# Patient Record
Sex: Male | Born: 1960 | Race: White | Hispanic: No | Smoking: Current every day smoker
Health system: Southern US, Community
[De-identification: ages and names within clinical notes are randomized; demographics above are authoritative.]

## PROBLEM LIST (undated history)

## (undated) DIAGNOSIS — R569 Unspecified convulsions: Secondary | ICD-10-CM

## (undated) DIAGNOSIS — F419 Anxiety disorder, unspecified: Secondary | ICD-10-CM

## (undated) DIAGNOSIS — E119 Type 2 diabetes mellitus without complications: Secondary | ICD-10-CM

## (undated) DIAGNOSIS — I639 Cerebral infarction, unspecified: Secondary | ICD-10-CM

## (undated) DIAGNOSIS — F1011 Alcohol abuse, in remission: Secondary | ICD-10-CM

## (undated) DIAGNOSIS — J449 Chronic obstructive pulmonary disease, unspecified: Secondary | ICD-10-CM

## (undated) DIAGNOSIS — I509 Heart failure, unspecified: Secondary | ICD-10-CM

## (undated) DIAGNOSIS — B1920 Unspecified viral hepatitis C without hepatic coma: Secondary | ICD-10-CM

## (undated) DIAGNOSIS — I219 Acute myocardial infarction, unspecified: Secondary | ICD-10-CM

## (undated) DIAGNOSIS — K7460 Unspecified cirrhosis of liver: Secondary | ICD-10-CM

## (undated) DIAGNOSIS — M1990 Unspecified osteoarthritis, unspecified site: Secondary | ICD-10-CM

## (undated) HISTORY — PX: FRACTURE SURGERY: SHX138

## (undated) HISTORY — PX: TONSILLECTOMY: SUR1361

## (undated) HISTORY — DX: Alcohol abuse, in remission: F10.11

## (undated) HISTORY — PX: EYE SURGERY: SHX253

---

## 2010-08-10 HISTORY — PX: ESOPHAGOGASTRODUODENOSCOPY: SHX1529

## 2010-08-10 HISTORY — PX: COLONOSCOPY: SHX174

## 2011-07-15 DIAGNOSIS — J449 Chronic obstructive pulmonary disease, unspecified: Secondary | ICD-10-CM | POA: Diagnosis present

## 2011-07-15 DIAGNOSIS — G8921 Chronic pain due to trauma: Secondary | ICD-10-CM | POA: Insufficient documentation

## 2011-07-15 DIAGNOSIS — F1010 Alcohol abuse, uncomplicated: Secondary | ICD-10-CM | POA: Insufficient documentation

## 2013-12-20 ENCOUNTER — Emergency Department (HOSPITAL_COMMUNITY)
Admission: EM | Admit: 2013-12-20 | Discharge: 2013-12-20 | Disposition: A | Discharge status: Home / Self Care | Payer: Medicaid Other | Attending: Emergency Medicine | Admitting: Emergency Medicine

## 2013-12-20 ENCOUNTER — Encounter (HOSPITAL_COMMUNITY): Payer: Self-pay | Admitting: Emergency Medicine

## 2013-12-20 DIAGNOSIS — F17200 Nicotine dependence, unspecified, uncomplicated: Secondary | ICD-10-CM | POA: Diagnosis not present

## 2013-12-20 DIAGNOSIS — K068 Other specified disorders of gingiva and edentulous alveolar ridge: Secondary | ICD-10-CM

## 2013-12-20 DIAGNOSIS — K089 Disorder of teeth and supporting structures, unspecified: Secondary | ICD-10-CM | POA: Diagnosis present

## 2013-12-20 DIAGNOSIS — K1370 Unspecified lesions of oral mucosa: Secondary | ICD-10-CM | POA: Insufficient documentation

## 2013-12-20 HISTORY — DX: Chronic obstructive pulmonary disease, unspecified: J44.9

## 2013-12-20 HISTORY — DX: Unspecified convulsions: R56.9

## 2013-12-20 HISTORY — DX: Unspecified viral hepatitis C without hepatic coma: B19.20

## 2013-12-20 HISTORY — DX: Unspecified cirrhosis of liver: K74.60

## 2013-12-20 MED ORDER — IBUPROFEN 800 MG PO TABS
800.0000 mg | ORAL_TABLET | Freq: Once | ORAL | Status: AC
  Administered 2013-12-20: 800 mg via ORAL
  Filled 2013-12-20: qty 1

## 2013-12-20 MED ORDER — IBUPROFEN 800 MG PO TABS: 800.0000 mg | ORAL_TABLET | Freq: Three times a day (TID) | ORAL | Status: DC | PRN

## 2013-12-20 MED ORDER — AMOXICILLIN 500 MG PO CAPS: 500.0000 mg | ORAL_CAPSULE | Freq: Three times a day (TID) | ORAL | Status: DC

## 2013-12-20 NOTE — ED Notes (Signed)
Multiple teeth pulled Friday. Pain since.

## 2013-12-20 NOTE — ED Provider Notes (Signed)
CSN: [id number]     Arrival date & time 12/20/13  8315 History   First MD Initiated Contact with Patient 12/20/13 1003     No chief complaint on file.    (Consider location/radiation/quality/duration/timing/severity/associated sxs/prior Treatment) HPI Comments: Patient is 53 year old male who presents to the ED with complaints of gum pain - he states that 3 days ago [facility] and had the remaining teeth in his maxilla pulled - he states that since then he has had increased swelling and pain - he thinks he may have dry socket.  He denies fever or chills but reports that OTC medications are not helping - no inability to open his mouth but does report po intake is poor due to the pain.  The history is provided by the patient. No language interpreter was used.    No past medical history on file. No past surgical history on file. No family history on file. History  Substance Use Topics  . Smoking status: Not on file  . Smokeless tobacco: Not on file  . Alcohol Use: Not on file    Review of Systems  All other systems reviewed and are negative.     Allergies  Review of patient's allergies indicates not on file.  Home Medications   Prior to Admission medications   Not on File   There were no vitals taken for this visit. Physical Exam  Nursing note and vitals reviewed. Constitutional: He is oriented to person, place, and time. He appears well-developed and well-nourished. No distress.  HENT:  Head: Normocephalic and atraumatic.  Right Ear: External ear normal.  Left Ear: External ear normal.  Nose: Nose normal.  Mouth/Throat: No oropharyngeal exudate.  endentulous to upper jaw with healing sockets of teeth #3, #4, #6, #7, #8, #9, #11 and #12.  Marked gingivitis without frank abscess formation, no trismus, no facial swelling.  Eyes: Conjunctivae are normal. Pupils are equal, round, and reactive to light. No scleral icterus.  Pulmonary/Chest: Effort  normal.  Musculoskeletal: Normal range of motion. He exhibits no edema and no tenderness.  Neurological: He is alert and oriented to person, place, and time. He exhibits normal muscle tone. Coordination normal.  Skin: Skin is warm and dry. No rash noted. No erythema. No pallor.  Psychiatric: He has a normal mood and affect. His behavior is normal. Judgment and thought content normal.    ED Course  Procedures (including critical care time) Labs Review Labs Reviewed - No data to display  Imaging Review No results found.   EKG Interpretation None      MDM   Gum pain   Patient here with gum pain after having teeth pulled, I do not suspect soft tissue facial abscess, ludwig's angina.  Will start on antibiotics and ibuprofen 800mg     Joaquim Lai C. Joelyn Oms, PA-C 12/20/13 1017

## 2013-12-20 NOTE — Discharge Instructions (Signed)
Gingivitis Gingivitis is a form of gum (periodontal) disease that causes redness, soreness, and swelling (inflammation) of your gums. CAUSES The most common cause of gingivitis is poor oral hygiene. A sticky substance made of bacteria, mucus, and food particles (plaque), is deposited on the exposed part of teeth. As plaque builds up, it reacts with the saliva in your mouth to form something called  tartar. Tartar is a hard deposit that becomes trapped around the base of the tooth. Plaque and tartar irritate the gums, leading to the formation of gingivitis. Other factors that increase your risk for gingivitis include:   Tobacco use.  Diabetes.  Older age.  Certain medications.  Certain viral or fungal infections.  Dry mouth.  Hormonal changes such as during pregnancy.  Poor nutrition.  Substance abuse.  Poor fitting dental restorations or appliances. SYMPTOMS You may notice inflammation of the soft tissue (gingiva) around the teeth. When these tissues become inflamed, they bleed easily, especially during flossing or brushing. The gums may also be:   Tender to the touch.  Bright red, purple red, or have a shiny appearance.  Swollen.  Wearing away from the teeth (receding), which exposes more of the tooth. Bad breath is often present. Continued infection around teeth can eventually cause cavities and loosen teeth. This may lead to eventual tooth loss. DIAGNOSIS A medical and dental history will be taken. Your mouth, teeth, and gums will be examined. Your dentist will look for soft, swollen purple-red, irritated gums. There may be deposits of plaque and tartar at the base of the teeth. Your gums will be looked at for the degree of redness, puffiness, and bleeding tendencies. Your dentist will see if any of the teeth are loose. X-rays may be taken to see if the inflammation has spread to the supporting structures of the teeth. TREATMENT The goal is to reduce and reverse the  inflammation. Proper treatment can usually reverse the symptoms of gingivitis and prevent further progression of the disease. Have your teeth cleaned. During the cleaning, all plaque and tartar will be removed. Instruction for proper home care will be given. You will need regular professional cleanings and check-ups in the future. HOME CARE INSTRUCTIONS  Brush your teeth twice a day and floss at least once per day. When flossing, it is best to floss first then brush.  Limit sugar between meals and maintain a well-balanced diet.  Even the best dental hygiene will not prevent plaque from developing. It is necessary for you to see your dentist on a regular basis for cleaning and regular checkups.  Your dentist can recommend proper oral hygiene and mouth care and suggest special toothpastes or mouth rinses.  Stop smoking. SEEK DENTAL OR MEDICAL CARE IF:  You have painful, reddened tissue around your teeth, or you have puffy swollen gums.  You have difficulty chewing.  You notice any loose or infected teeth.  You have swollen glands.  Your gums bleed easily when you brush your teeth or are very tender to the touch. Document Released: 01/20/2001 Document Revised: 10/19/2011 Document Reviewed: 10/31/2010 [facility] Patient Information 2014 [place].  Gum Disease Gum disease is an infection of the tissues that surround and support the teeth (periodontium). This includes the gums, connective tissue fibers (ligaments), and the thickened ridges of the tooth bone (sockets). The disease is caused by germs (bacteria) that grow in soft deposits (plaque) on the teeth. This results in redness, soreness, and swelling (inflammation). This inflammation causes the gums to bleed. If left  untreated, it can lead to damage of the tissues and supportive bone. Although bacteria are known as the major cause of gum disease, other risk factors include tobacco use, diabetes, certain medications, hormones,  pregnancy, and genetic factors. SYMPTOMS   Gums that bleed easily.  Red or swollen gums.  Bad breath that does not go away.  Gums that have pulled away from the teeth.  Loose or separating permanent teeth.  Painful chewing.  Changes in the way your teeth fit together. DIAGNOSIS  A thorough exam will be performed by a dentist to determine the presence and stage of gum disease. The stage is how far the gum disease has developed. TREATMENT  Treatment is based on the stages of gum disease. The stages include:  Mild. If it is caught early, conditions can improve by brushing and flossing properly.  Moderate. You may need special cleaning (scaling and root planing). This method removes plaque and hardened plaque (tartar) above and below the gum line. Medication may also be used to treat moderate gum disease.  Severe. This stage requires surgery of the gums and supporting bone. PREVENTION  You can prevent gum disease by:  Practicing good oral hygiene, including brushing and flossing properly.  Avoiding use of tobacco products.  Scheduling regular dental check-ups and cleanings.  Eating a well-balanced diet. SEEK IMMEDIATE DENTAL OR MEDICAL CARE IF:  You have fever over 102 F (38.9 C).  You have swelling of your face, neck, or jaw.  You are unable to open your mouth.  You have severe pain not controlled by pain medicine. Document Released: 01/14/2010 Document Revised: 04/20/2012 Document Reviewed: 01/14/2010 [facility] Patient Information 2014 [place].

## 2013-12-21 NOTE — ED Provider Notes (Signed)
Medical screening examination/treatment/procedure(s) were performed by non-physician practitioner and as supervising physician I was immediately available for consultation/collaboration.   EKG Interpretation None        Alfonzo Feller, DO 12/21/13 1023

## 2014-04-10 ENCOUNTER — Emergency Department (HOSPITAL_COMMUNITY)
Admission: EM | Admit: 2014-04-10 | Discharge: 2014-04-10 | Disposition: A | Discharge status: Home / Self Care | Payer: Medicaid Other | Attending: Emergency Medicine | Admitting: Emergency Medicine

## 2014-04-10 ENCOUNTER — Encounter (HOSPITAL_COMMUNITY): Payer: Self-pay | Admitting: Emergency Medicine

## 2014-04-10 DIAGNOSIS — Z8719 Personal history of other diseases of the digestive system: Secondary | ICD-10-CM | POA: Diagnosis not present

## 2014-04-10 DIAGNOSIS — Z791 Long term (current) use of non-steroidal anti-inflammatories (NSAID): Secondary | ICD-10-CM | POA: Insufficient documentation

## 2014-04-10 DIAGNOSIS — H5710 Ocular pain, unspecified eye: Secondary | ICD-10-CM | POA: Insufficient documentation

## 2014-04-10 DIAGNOSIS — Z79899 Other long term (current) drug therapy: Secondary | ICD-10-CM | POA: Insufficient documentation

## 2014-04-10 DIAGNOSIS — J449 Chronic obstructive pulmonary disease, unspecified: Secondary | ICD-10-CM | POA: Insufficient documentation

## 2014-04-10 DIAGNOSIS — F17200 Nicotine dependence, unspecified, uncomplicated: Secondary | ICD-10-CM | POA: Insufficient documentation

## 2014-04-10 DIAGNOSIS — Z7982 Long term (current) use of aspirin: Secondary | ICD-10-CM | POA: Insufficient documentation

## 2014-04-10 DIAGNOSIS — G40909 Epilepsy, unspecified, not intractable, without status epilepticus: Secondary | ICD-10-CM | POA: Diagnosis not present

## 2014-04-10 DIAGNOSIS — Z9889 Other specified postprocedural states: Secondary | ICD-10-CM | POA: Insufficient documentation

## 2014-04-10 DIAGNOSIS — Z8669 Personal history of other diseases of the nervous system and sense organs: Secondary | ICD-10-CM

## 2014-04-10 DIAGNOSIS — H02843 Edema of right eye, unspecified eyelid: Secondary | ICD-10-CM

## 2014-04-10 DIAGNOSIS — Z8619 Personal history of other infectious and parasitic diseases: Secondary | ICD-10-CM | POA: Diagnosis not present

## 2014-04-10 DIAGNOSIS — J4489 Other specified chronic obstructive pulmonary disease: Secondary | ICD-10-CM | POA: Insufficient documentation

## 2014-04-10 DIAGNOSIS — H5789 Other specified disorders of eye and adnexa: Secondary | ICD-10-CM | POA: Insufficient documentation

## 2014-04-10 MED ORDER — CIPROFLOXACIN HCL 0.3 % OP SOLN: 2.0000 [drp] | OPHTHALMIC | Status: DC

## 2014-04-10 MED ORDER — KETOROLAC TROMETHAMINE 0.5 % OP SOLN: 1.0000 [drp] | Freq: Four times a day (QID) | OPHTHALMIC | Status: DC | PRN
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
## 2014-04-10 NOTE — Discharge Instructions (Signed)
Warm compresses on your eye. Use the eye drops for pain and infection. You can use triple antibiotic ointment on the skin that is peeling. You can be rechecked by Dr Iona Hansen, the ophthalmologist on call, call his office to get an appointment or see your own eye doctor. Return if you get fever, worsening swelling or pain.

## 2014-04-10 NOTE — ED Notes (Signed)
Vision 20/20 with glasses.  Pt has artificial right eye.

## 2014-05-07 ENCOUNTER — Encounter: Payer: Self-pay | Admitting: Gastroenterology

## 2014-05-15 ENCOUNTER — Ambulatory Visit: Payer: Medicaid Other | Admitting: Gastroenterology

## 2014-05-24 ENCOUNTER — Ambulatory Visit: Payer: Medicaid Other | Admitting: Gastroenterology

## 2014-06-11 ENCOUNTER — Ambulatory Visit (INDEPENDENT_AMBULATORY_CARE_PROVIDER_SITE_OTHER): Payer: Medicaid Other | Admitting: Gastroenterology

## 2014-06-11 ENCOUNTER — Encounter: Payer: Self-pay | Admitting: Gastroenterology

## 2014-06-11 VITALS — BP 108/75 | HR 84 | Temp 97.9°F | Ht 66.0 in | Wt 157.0 lb

## 2014-06-11 DIAGNOSIS — K7460 Unspecified cirrhosis of liver: Secondary | ICD-10-CM

## 2014-06-11 DIAGNOSIS — K5900 Constipation, unspecified: Secondary | ICD-10-CM | POA: Insufficient documentation

## 2014-06-11 MED ORDER — LINACLOTIDE 145 MCG PO CAPS
145.0000 ug | ORAL_CAPSULE | Freq: Every day | ORAL | Status: DC
Start: 2014-06-11 — End: 2014-07-31
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
## 2014-06-11 NOTE — Assessment & Plan Note (Signed)
Chronic. Start Linzess 145 mcg once daily. Samples provided.

## 2014-06-11 NOTE — Assessment & Plan Note (Addendum)
53 year old male with reported cirrhosis in the setting of chronic ETOH abuse and +HCV antibody. Unknown HCV PCR. Treatment naive. Fortunately, he has abstained from ETOH use for at least 7 months. Well-compensated. Last EGD in remote past. Ultimately needs HCV RNA with reflex genotype, INR, and elastography prior to submission for Harvoni to treat Hep C. Will also need EGD for variceal screening; I have requested outside records from [facility]. Will await these records prior to planning procedures. Will likely need colonoscopy as well due to low-volume hematochezia, which is likely benign in nature. However, if at least 3 years have passed since last lower GI evaluation, would have a low threshold for colonoscopy at time of EGD. Needs Hep A and B vaccinations as well; prescription provided.

## 2014-06-11 NOTE — Progress Notes (Signed)
cc'ed to pcp °

## 2014-06-11 NOTE — Patient Instructions (Signed)
Start taking Linzess 1 capsule each morning, 30 minutes before breakfast. This is for constipation. I have provided samples and sent the prescription to your pharmacy.   Please have blood work and ultrasound completed. We will then submit for Hepatitis C treatment.   Please complete Hepatitis A and B vaccinations with Dr. Legrand Rams.  Finally, I am reviewing records. We definitely need to pursue an upper endoscopy, but you may need a colonoscopy as well.

## 2014-06-12 LAB — PROTIME-INR
INR: 1.15 (ref <1.50)
PROTHROMBIN TIME: 14.8 s (ref 11.6–15.2)

## 2014-06-14 ENCOUNTER — Other Ambulatory Visit (HOSPITAL_COMMUNITY): Payer: Medicaid Other

## 2014-06-15 LAB — HCV RNA QUANT RFLX ULTRA OR GENOTYP
HCV QUANT LOG: 6.84 {Log} — AB (ref <1.18)
HCV QUANT: 6952175 [IU]/mL — AB (ref <15)

## 2014-06-19 LAB — HEPATITIS C GENOTYPE

## 2014-06-21 ENCOUNTER — Ambulatory Visit (HOSPITAL_COMMUNITY): Admission: RE | Admit: 2014-06-21 | Payer: Medicaid Other | Source: Ambulatory Visit

## 2014-07-02 ENCOUNTER — Telehealth: Payer: Self-pay | Admitting: Gastroenterology

## 2014-07-02 NOTE — Progress Notes (Signed)
Quick Note:  +RNA. Hep C genotype 1a.  Needs elastography, then we can submit for Harvoni X 12 weeks (treatment naive). Looks like he missed the elastography earlier this month. ______

## 2014-07-02 NOTE — Telephone Encounter (Signed)
Pt called saying that Dr Legrand Rams wanted him to go ahead and get set up for his colonoscopy and try to have it the same day as his other testing he has scheduled on NOV 30th. I told patient that with all the problems he is having he would need an OV prior to scheduling but he said that he was just seen on NOV 2. Please advise if he can have both these done on same day and call him back [phone]

## 2014-07-02 NOTE — Telephone Encounter (Signed)
Please advise 

## 2014-07-03 NOTE — Telephone Encounter (Signed)
He can't have it done on 11/30 because that is the elastography date. I have been unable to get the TCS/EGD reports from the past. He was not clear when this was done. Let's try to get these as soon as possible. He said it was from [facility]? I couldn't find through [facility].   If we can get it scheduled within 30 days and he is actually due for a colonoscopy, then I would say absolutely.

## 2014-07-03 NOTE — Telephone Encounter (Signed)
I have sent a second request ASAP to [facility].

## 2014-07-03 NOTE — Telephone Encounter (Signed)
Pt is set up for [place] on 07/09/14 @ 11:00

## 2014-07-03 NOTE — Telephone Encounter (Signed)
Received records.   Please see my addended note to office visit.

## 2014-07-04 ENCOUNTER — Encounter: Payer: Self-pay | Admitting: Gastroenterology

## 2014-07-04 ENCOUNTER — Other Ambulatory Visit: Payer: Self-pay

## 2014-07-04 MED ORDER — PEG-KCL-NACL-NASULF-NA ASC-C 100 G PO SOLR: 1.0000 | ORAL | Status: DC

## 2014-07-04 NOTE — Progress Notes (Signed)
Quick Note:  Let's set him up for TCS/EGD with Propofol with Dr. Oneida Alar. See office visit addendum. ______

## 2014-07-04 NOTE — Progress Notes (Signed)
Received outside operative reports. EGD/TCS in 2012 at [place]. Internal hemorrhoids, no polyps. EGD normal.   Due to history of cirrhosis and now low-volume hematochezia, proceed with TCS/EGD with Dr. Oneida Alar with Propofol.

## 2014-07-04 NOTE — Progress Notes (Signed)
Pt is set up for 07/31/14 @ 945

## 2014-07-09 ENCOUNTER — Ambulatory Visit (HOSPITAL_COMMUNITY)
Admission: RE | Admit: 2014-07-09 | Discharge: 2014-07-09 | Disposition: A | Discharge status: Home / Self Care | Payer: Medicaid Other | Source: Ambulatory Visit | Attending: Gastroenterology | Admitting: Gastroenterology

## 2014-07-09 DIAGNOSIS — B1920 Unspecified viral hepatitis C without hepatic coma: Secondary | ICD-10-CM | POA: Insufficient documentation

## 2014-07-09 DIAGNOSIS — K7460 Unspecified cirrhosis of liver: Secondary | ICD-10-CM

## 2014-07-24 NOTE — Progress Notes (Signed)
Quick Note:  Although he reports a history of cirrhosis, elastography reveals score of F0/F1, minimal fibrosis. He is scheduled for a colonoscopy/EGD. May not need EGD, but this can be determined at time of procedure. May be difficult to have Harvoni approved due to fibrosis score, but we will attempt this. ______

## 2014-07-25 NOTE — Patient Instructions (Signed)
Kepler Mccabe  07/25/2014   Your procedure is scheduled on:  07/31/2014  Report to [facility] at  800  AM.  Call this number if you have problems the morning of surgery: [phone]   Remember:   Do not eat food or drink liquids after midnight.   Take these medicines the morning of surgery with A SIP OF WATER:  Tegretol, neurontin, seroquel   Do not wear jewelry, make-up or nail polish.  Do not wear lotions, powders, or perfumes.   Do not shave 48 hours prior to surgery. Men may shave face and neck.  Do not bring valuables to the hospital.  [facility] is not responsible for any belongings or valuables.               Contacts, dentures or bridgework may not be worn into surgery.  Leave suitcase in the car. After surgery it may be brought to your room.  For patients admitted to the hospital, discharge time is determined by your treatment team.               Patients discharged the day of surgery will not be allowed to drive home.  Name and phone number of your driver: home Special Instructions: N/A   Please read over the following fact sheets that you were given: Pain Booklet, Coughing and Deep Breathing, Surgical Site Infection Prevention, Anesthesia Post-op Instructions and Care and Recovery After Surgery Colonoscopy A colonoscopy is an exam to look at the entire large intestine (colon). This exam can help find problems such as tumors, polyps, inflammation, and areas of bleeding. The exam takes about 1 hour.  LET [facility] CARE PROVIDER KNOW ABOUT:   Any allergies you have.  All medicines you are taking, including vitamins, herbs, eye drops, creams, and over-the-counter medicines.  Previous problems you or members of your family have had with the use of anesthetics.  Any blood disorders you have.  Previous surgeries you have had.  Medical conditions you have. RISKS AND COMPLICATIONS  Generally, this is a safe procedure. However, as with any procedure,  complications can occur. Possible complications include:  Bleeding.  Tearing or rupture of the colon wall.  Reaction to medicines given during the exam.  Infection (rare). BEFORE THE PROCEDURE   Ask your health care provider about changing or stopping your regular medicines.  You may be prescribed an oral bowel prep. This involves drinking a large amount of medicated liquid, starting the day before your procedure. The liquid will cause you to have multiple loose stools until your stool is almost clear or light green. This cleans out your colon in preparation for the procedure.  Do not eat or drink anything else once you have started the bowel prep, unless your health care provider tells you it is safe to do so.  Arrange for someone to drive you home after the procedure. PROCEDURE   You will be given medicine to help you relax (sedative).  You will lie on your side with your knees bent.  A long, flexible tube with a light and camera on the end (colonoscope) will be inserted through the rectum and into the colon. The camera sends video back to a computer screen as it moves through the colon. The colonoscope also releases carbon dioxide gas to inflate the colon. This helps your health care provider see the area better.  During the exam, your health care provider may take a small tissue sample (biopsy) to be  examined under a microscope if any abnormalities are found.  The exam is finished when the entire colon has been viewed. AFTER THE PROCEDURE   Do not drive for 24 hours after the exam.  You may have a small amount of blood in your stool.  You may pass moderate amounts of gas and have mild abdominal cramping or bloating. This is caused by the gas used to inflate your colon during the exam.  Ask when your test results will be ready and how you will get your results. Make sure you get your test results. Document Released: 07/24/2000 Document Revised: 05/17/2013 Document Reviewed:  04/03/2013 [facility] Patient Information 2015 [place], [place]. This information is not intended to replace advice given to you by your health care provider. Make sure you discuss any questions you have with your health care provider. PATIENT INSTRUCTIONS POST-ANESTHESIA  IMMEDIATELY FOLLOWING SURGERY:  Do not drive or operate machinery for the first twenty four hours after surgery.  Do not make any important decisions for twenty four hours after surgery or while taking narcotic pain medications or sedatives.  If you develop intractable nausea and vomiting or a severe headache please notify your doctor immediately.  FOLLOW-UP:  Please make an appointment with your surgeon as instructed. You do not need to follow up with anesthesia unless specifically instructed to do so.  WOUND CARE INSTRUCTIONS (if applicable):  Keep a dry clean dressing on the anesthesia/puncture wound site if there is drainage.  Once the wound has quit draining you may leave it open to air.  Generally you should leave the bandage intact for twenty four hours unless there is drainage.  If the epidural site drains for more than 36-48 hours please call the anesthesia department.  QUESTIONS?:  Please feel free to call your physician or the hospital operator if you have any questions, and they will be happy to assist you.

## 2014-07-26 ENCOUNTER — Encounter (HOSPITAL_COMMUNITY)
Admission: RE | Admit: 2014-07-26 | Discharge: 2014-07-26 | Disposition: A | Discharge status: Home / Self Care | Payer: Medicaid Other | Source: Ambulatory Visit | Attending: Gastroenterology | Admitting: Gastroenterology

## 2014-07-26 ENCOUNTER — Encounter (HOSPITAL_COMMUNITY): Payer: Self-pay

## 2014-07-26 ENCOUNTER — Other Ambulatory Visit: Payer: Self-pay

## 2014-07-26 DIAGNOSIS — Z01818 Encounter for other preprocedural examination: Secondary | ICD-10-CM | POA: Insufficient documentation

## 2014-07-26 HISTORY — DX: Type 2 diabetes mellitus without complications: E11.9

## 2014-07-26 HISTORY — DX: Acute myocardial infarction, unspecified: I21.9

## 2014-07-26 HISTORY — DX: Anxiety disorder, unspecified: F41.9

## 2014-07-26 HISTORY — DX: Unspecified osteoarthritis, unspecified site: M19.90

## 2014-07-26 HISTORY — DX: Cerebral infarction, unspecified: I63.9

## 2014-07-26 LAB — HEMOGLOBIN AND HEMATOCRIT, BLOOD
HCT: 42.7 % (ref 39.0–52.0)
HEMOGLOBIN: 14.2 g/dL (ref 13.0–17.0)

## 2014-07-26 LAB — BASIC METABOLIC PANEL
Anion gap: 15 (ref 5–15)
BUN: 7 mg/dL (ref 6–23)
CO2: 23 mEq/L (ref 19–32)
CREATININE: 0.81 mg/dL (ref 0.50–1.35)
Calcium: 9 mg/dL (ref 8.4–10.5)
Chloride: 103 mEq/L (ref 96–112)
GLUCOSE: 150 mg/dL — AB (ref 70–99)
Potassium: 4 mEq/L (ref 3.7–5.3)
Sodium: 141 mEq/L (ref 137–147)

## 2014-07-26 NOTE — Pre-Procedure Instructions (Signed)
Patient given information to sign up for my chart at home. 

## 2014-07-30 NOTE — Progress Notes (Signed)
Quick Note:  Pt is aware. ______ 

## 2014-07-31 ENCOUNTER — Ambulatory Visit (HOSPITAL_COMMUNITY): Payer: Medicaid Other | Admitting: Anesthesiology

## 2014-07-31 ENCOUNTER — Telehealth: Payer: Self-pay

## 2014-07-31 ENCOUNTER — Encounter (HOSPITAL_COMMUNITY): Payer: Self-pay

## 2014-07-31 ENCOUNTER — Encounter (HOSPITAL_COMMUNITY)
Admission: RE | Disposition: A | Discharge status: Home / Self Care | Payer: Self-pay | Source: Ambulatory Visit | Attending: Gastroenterology

## 2014-07-31 ENCOUNTER — Ambulatory Visit (HOSPITAL_COMMUNITY)
Admission: RE | Admit: 2014-07-31 | Discharge: 2014-07-31 | Disposition: A | Discharge status: Home / Self Care | Payer: Medicaid Other | Source: Ambulatory Visit | Attending: Gastroenterology | Admitting: Gastroenterology

## 2014-07-31 DIAGNOSIS — B9681 Helicobacter pylori [H. pylori] as the cause of diseases classified elsewhere: Secondary | ICD-10-CM | POA: Insufficient documentation

## 2014-07-31 DIAGNOSIS — K7460 Unspecified cirrhosis of liver: Secondary | ICD-10-CM | POA: Insufficient documentation

## 2014-07-31 DIAGNOSIS — E119 Type 2 diabetes mellitus without complications: Secondary | ICD-10-CM | POA: Diagnosis not present

## 2014-07-31 DIAGNOSIS — I252 Old myocardial infarction: Secondary | ICD-10-CM | POA: Insufficient documentation

## 2014-07-31 DIAGNOSIS — D120 Benign neoplasm of cecum: Secondary | ICD-10-CM | POA: Insufficient documentation

## 2014-07-31 DIAGNOSIS — K222 Esophageal obstruction: Secondary | ICD-10-CM | POA: Diagnosis not present

## 2014-07-31 DIAGNOSIS — K625 Hemorrhage of anus and rectum: Secondary | ICD-10-CM | POA: Diagnosis present

## 2014-07-31 DIAGNOSIS — Z79899 Other long term (current) drug therapy: Secondary | ICD-10-CM | POA: Diagnosis not present

## 2014-07-31 DIAGNOSIS — Z791 Long term (current) use of non-steroidal anti-inflammatories (NSAID): Secondary | ICD-10-CM | POA: Insufficient documentation

## 2014-07-31 DIAGNOSIS — K2960 Other gastritis without bleeding: Secondary | ICD-10-CM | POA: Diagnosis not present

## 2014-07-31 DIAGNOSIS — F419 Anxiety disorder, unspecified: Secondary | ICD-10-CM | POA: Insufficient documentation

## 2014-07-31 DIAGNOSIS — Z8673 Personal history of transient ischemic attack (TIA), and cerebral infarction without residual deficits: Secondary | ICD-10-CM | POA: Diagnosis not present

## 2014-07-31 DIAGNOSIS — K209 Esophagitis, unspecified: Secondary | ICD-10-CM | POA: Diagnosis not present

## 2014-07-31 DIAGNOSIS — J449 Chronic obstructive pulmonary disease, unspecified: Secondary | ICD-10-CM | POA: Diagnosis not present

## 2014-07-31 DIAGNOSIS — F17210 Nicotine dependence, cigarettes, uncomplicated: Secondary | ICD-10-CM | POA: Insufficient documentation

## 2014-07-31 DIAGNOSIS — K648 Other hemorrhoids: Secondary | ICD-10-CM | POA: Insufficient documentation

## 2014-07-31 DIAGNOSIS — K635 Polyp of colon: Secondary | ICD-10-CM

## 2014-07-31 DIAGNOSIS — D128 Benign neoplasm of rectum: Secondary | ICD-10-CM

## 2014-07-31 DIAGNOSIS — D125 Benign neoplasm of sigmoid colon: Secondary | ICD-10-CM | POA: Diagnosis not present

## 2014-07-31 DIAGNOSIS — K649 Unspecified hemorrhoids: Secondary | ICD-10-CM

## 2014-07-31 HISTORY — PX: HEMORRHOID BANDING: SHX5850

## 2014-07-31 HISTORY — PX: ESOPHAGOGASTRODUODENOSCOPY (EGD) WITH PROPOFOL: SHX5813

## 2014-07-31 HISTORY — PX: POLYPECTOMY: SHX5525

## 2014-07-31 HISTORY — PX: COLONOSCOPY WITH PROPOFOL: SHX5780

## 2014-07-31 LAB — GLUCOSE, CAPILLARY: Glucose-Capillary: 86 mg/dL (ref 70–99)

## 2014-07-31 SURGERY — COLONOSCOPY WITH PROPOFOL
Anesthesia: Monitor Anesthesia Care | Site: Rectum

## 2014-07-31 MED ORDER — FENTANYL CITRATE 0.05 MG/ML IJ SOLN
25.0000 ug | INTRAMUSCULAR | Status: AC
  Administered 2014-07-31 (×2): 25 ug via INTRAVENOUS

## 2014-07-31 MED ORDER — ONDANSETRON HCL 4 MG/2ML IJ SOLN
4.0000 mg | Freq: Once | INTRAMUSCULAR | Status: AC
  Administered 2014-07-31: 4 mg via INTRAVENOUS

## 2014-07-31 MED ORDER — MIDAZOLAM HCL 2 MG/2ML IJ SOLN
INTRAMUSCULAR | Status: AC
  Filled 2014-07-31: qty 2

## 2014-07-31 MED ORDER — LIDOCAINE HCL (CARDIAC) 20 MG/ML IV SOLN
INTRAVENOUS | Status: DC | PRN
  Administered 2014-07-31: 50 mg via INTRAVENOUS

## 2014-07-31 MED ORDER — LIDOCAINE VISCOUS 2 % MT SOLN
3.0000 mL | Freq: Once | OROMUCOSAL | Status: AC
Start: 2014-07-31 — End: 2014-07-31
  Administered 2014-07-31: 3 mL via OROMUCOSAL

## 2014-07-31 MED ORDER — LIDOCAINE VISCOUS 2 % MT SOLN
OROMUCOSAL | Status: AC
  Filled 2014-07-31: qty 15

## 2014-07-31 MED ORDER — FENTANYL CITRATE 0.05 MG/ML IJ SOLN
INTRAMUSCULAR | Status: AC
Start: 2014-07-31 — End: 2014-07-31
  Filled 2014-07-31: qty 2

## 2014-07-31 MED ORDER — LIDOCAINE HCL (PF) 1 % IJ SOLN
INTRAMUSCULAR | Status: AC
  Filled 2014-07-31: qty 5

## 2014-07-31 MED ORDER — ONDANSETRON HCL 4 MG/2ML IJ SOLN
INTRAMUSCULAR | Status: AC
  Filled 2014-07-31: qty 2

## 2014-07-31 MED ORDER — PROPOFOL 10 MG/ML IV EMUL
INTRAVENOUS | Status: AC
  Filled 2014-07-31: qty 20

## 2014-07-31 MED ORDER — PROPOFOL INFUSION 10 MG/ML OPTIME
INTRAVENOUS | Status: DC | PRN
  Administered 2014-07-31: 11:00:00 via INTRAVENOUS
  Administered 2014-07-31: 75 ug/kg/min via INTRAVENOUS

## 2014-07-31 MED ORDER — MIDAZOLAM HCL 5 MG/5ML IJ SOLN
INTRAMUSCULAR | Status: DC | PRN
  Administered 2014-07-31: 2 mg via INTRAVENOUS

## 2014-07-31 MED ORDER — ONDANSETRON HCL 4 MG/2ML IJ SOLN: 4.0000 mg | Freq: Once | INTRAMUSCULAR | Status: DC | PRN

## 2014-07-31 MED ORDER — LINACLOTIDE 290 MCG PO CAPS: ORAL_CAPSULE | ORAL | Status: DC

## 2014-07-31 MED ORDER — FENTANYL CITRATE 0.05 MG/ML IJ SOLN
INTRAMUSCULAR | Status: AC
  Filled 2014-07-31: qty 2

## 2014-07-31 MED ORDER — LACTATED RINGERS IV SOLN
INTRAVENOUS | Status: DC
  Administered 2014-07-31: 10:00:00 via INTRAVENOUS

## 2014-07-31 MED ORDER — PROPOFOL 10 MG/ML IV BOLUS
INTRAVENOUS | Status: DC | PRN
  Administered 2014-07-31: 20 mg via INTRAVENOUS

## 2014-07-31 MED ORDER — LACTATED RINGERS IV SOLN
INTRAVENOUS | Status: DC | PRN
  Administered 2014-07-31: 09:00:00 via INTRAVENOUS

## 2014-07-31 MED ORDER — HYDROCODONE-ACETAMINOPHEN 5-325 MG PO TABS: ORAL_TABLET | ORAL | Status: DC

## 2014-07-31 MED ORDER — MIDAZOLAM HCL 2 MG/2ML IJ SOLN
1.0000 mg | INTRAMUSCULAR | Status: DC | PRN
  Administered 2014-07-31: 2 mg via INTRAVENOUS

## 2014-07-31 MED ORDER — OMEPRAZOLE 20 MG PO CPDR: DELAYED_RELEASE_CAPSULE | ORAL | Status: DC

## 2014-07-31 MED ORDER — STERILE WATER FOR IRRIGATION IR SOLN
Status: DC | PRN
  Administered 2014-07-31: 11:00:00

## 2014-07-31 MED ORDER — FENTANYL CITRATE 0.05 MG/ML IJ SOLN
25.0000 ug | INTRAMUSCULAR | Status: DC | PRN
  Administered 2014-07-31 (×4): 50 ug via INTRAVENOUS

## 2014-07-31 SURGICAL SUPPLY — 13 items
BAND LIGATOR SUPER 7 2.8 (MISCELLANEOUS) ×4 IMPLANT
ELECT REM PT RETURN 9FT ADLT (ELECTROSURGICAL) ×4
ELECTRODE REM PT RTRN 9FT ADLT (ELECTROSURGICAL) ×2 IMPLANT
FLOOR PAD 36X40 (MISCELLANEOUS) ×4
FORCEPS BIOP RAD 4 LRG CAP 4 (CUTTING FORCEPS) ×8 IMPLANT
FORMALIN 10 PREFIL 20ML (MISCELLANEOUS) ×8 IMPLANT
KIT CLEAN ENDO COMPLIANCE (KITS) ×4 IMPLANT
LUBRICANT JELLY 4.5OZ STERILE (MISCELLANEOUS) ×4 IMPLANT
MANIFOLD NEPTUNE II (INSTRUMENTS) ×8 IMPLANT
PAD FLOOR 36X40 (MISCELLANEOUS) ×2 IMPLANT
TRAP SPECIMEN MUCOUS 40CC (MISCELLANEOUS) ×8 IMPLANT
TUBING IRRIGATION ENDOGATOR (MISCELLANEOUS) ×4 IMPLANT
WATER STERILE IRR 1000ML POUR (IV SOLUTION) ×4 IMPLANT

## 2014-07-31 NOTE — Transfer of Care (Signed)
Immediate Anesthesia Transfer of Care Note  Patient: Francisco Robinson  Procedure(s) Performed: Procedure(s) with comments: COLONOSCOPY WITH PROPOFOL (N/A) - In cecum @ 1046; cecal withdrawal time= 7min POLYPECTOMY (N/A) - cecal polyp, sigmoid colon polyp HEMORRHOID BANDING (N/A) ESOPHAGOGASTRODUODENOSCOPY (EGD) WITH PROPOFOL (N/A)  Patient Location: PACU  Anesthesia Type:MAC  Level of Consciousness: awake, alert , oriented and patient cooperative  Airway & Oxygen Therapy: Patient Spontanous Breathing and Patient connected to face mask oxygen  Post-op Assessment: Report given to PACU RN and Post -op Vital signs reviewed and stable  Post vital signs: Reviewed and stable  Complications: No apparent anesthesia complications

## 2014-07-31 NOTE — Addendum Note (Signed)
Addendum  created 07/31/14 1141 by Mickel Baas, CRNA   Modules edited: Charges VN

## 2014-07-31 NOTE — Telephone Encounter (Signed)
Pt called back and is aware that Dr. Oneida Alar said to come to the office to have his hemorrhoid bands adjusted. His driver is available and they will be right on and we will page Dr. Oneida Alar when he gets here.
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
## 2014-07-31 NOTE — Telephone Encounter (Signed)
Called mobile and asked pt to call immediately, I have a message from Dr. Oneida Alar.

## 2014-07-31 NOTE — Telephone Encounter (Signed)
Tried to call pt and could not leave a message.  

## 2014-07-31 NOTE — Anesthesia Preprocedure Evaluation (Signed)
Anesthesia Evaluation  Patient identified by MRN, date of birth, ID band Patient awake    Reviewed: Allergy & Precautions, H&P , NPO status , Patient's Chart, lab work & pertinent test results  Airway Mallampati: I  TM Distance: >3 FB Neck ROM: Full    Dental  (+) Edentulous Upper, Edentulous Lower   Pulmonary COPDCurrent Smoker,  breath sounds clear to auscultation        Cardiovascular + Past MI Rhythm:Regular Rate:Normal     Neuro/Psych Seizures -, Poorly Controlled,  PSYCHIATRIC DISORDERS Anxiety CVA, Residual Symptoms    GI/Hepatic (+) Cirrhosis -  Esophageal Varices  substance abuse  alcohol use, Hepatitis -, C  Endo/Other  diabetes, Type 2  Renal/GU      Musculoskeletal   Abdominal   Peds  Hematology   Anesthesia Other Findings   Reproductive/Obstetrics                             Anesthesia Physical Anesthesia Plan  ASA: III  Anesthesia Plan: MAC   Post-op Pain Management:    Induction: Intravenous  Airway Management Planned: Simple Face Mask  Additional Equipment:   Intra-op Plan:   Post-operative Plan:   Informed Consent: I have reviewed the patients History and Physical, chart, labs and discussed the procedure including the risks, benefits and alternatives for the proposed anesthesia with the patient or authorized representative who has indicated his/her understanding and acceptance.     Plan Discussed with:   Anesthesia Plan Comments:         Anesthesia Quick Evaluation

## 2014-07-31 NOTE — Discharge Instructions (Signed)
You had 2 polyps removed. YouR RECTAL BLEEDING IS DUE TO internal hemorrhoids. I PLACED 3 BANDS TO TREAT THE BLEEDING.  You have MODERATE gastritis MOST LIKELY DUE TO PRIOR ALCOHOL USE, AND NAPROXEN.  I biopsied your stomach.    DRINK WATER TO KEEP YOUR URINE LIGHT YELLOW. SEE UROLOGY FOR YOUR DIFFICULTY PASSING URINE.  FOLLOW A HIGH FIBER DIET. AVOID ITEMS THAT CAUSE BLOATING & GAS. SEE INFO BELOW.   CONTINUE LINZESS TO TREAT CONSTIPATION. TAKE WITH BREAKFAST.  START OMEPRAZOLE. TAKE 30 MINUTES PRIOR TO BREAKFAST.   My office will contact you with your results WITHIN 14 DAYS OR YOU CAN LOOK THEM UP ON MY CHART.  CALL IN ONE MONTH IF YOUR CONSTIPATION IS NOT BETTER.  FOLLOW UP IN 4 MOS. 11/29/13 @ 9:30 AM.  Next colonoscopy in 1-3 years. YOUR SISTERS, BROTHERS, CHILDREN, AND PARENTS NEED TO HAVE A COLONOSCOPY STARTING AT THE AGE OF 40.     ENDOSCOPY Care After Read the instructions outlined below and refer to this sheet in the next week. These discharge instructions provide you with general information on caring for yourself after you leave the hospital. While your treatment has been planned according to the most current medical practices available, unavoidable complications occasionally occur. If you have any problems or questions after discharge, call DR. FIELDS, [phone].  ACTIVITY  You may resume your regular activity, but move at a slower pace for the next 24 hours.   Take frequent rest periods for the next 24 hours.   Walking will help get rid of the air and reduce the bloated feeling in your belly (abdomen).   No driving for 24 hours (because of the medicine (anesthesia) used during the test).   You may shower.   Do not sign any important legal documents or operate any machinery for 24 hours (because of the anesthesia used during the test).    NUTRITION  Drink plenty of fluids.   You may resume your normal diet as instructed by your doctor.   Begin with a light  meal and progress to your normal diet. Heavy or fried foods are harder to digest and may make you feel sick to your stomach (nauseated).   Avoid alcoholic beverages for 24 hours or as instructed.    MEDICATIONS  You may resume your normal medications.   WHAT YOU CAN EXPECT TODAY  Some feelings of bloating in the abdomen.   Passage of more gas than usual.   Spotting of blood in your stool or on the toilet paper  .  IF YOU HAD POLYPS REMOVED DURING THE ENDOSCOPY:  Eat a soft diet IF YOU HAVE NAUSEA, BLOATING, ABDOMINAL PAIN, OR VOMITING.    FINDING OUT THE RESULTS OF YOUR TEST Not all test results are available during your visit. DR. Oneida Alar WILL CALL YOU WITHIN 14 DAYS OF YOUR PROCEDUE WITH YOUR RESULTS. Do not assume everything is normal if you have not heard from DR. FIELDS, CALL HER OFFICE AT [phone].  SEEK IMMEDIATE MEDICAL ATTENTION AND CALL THE OFFICE: [phone] IF:  You have more than a spotting of blood in your stool.   Your belly is swollen (abdominal distention).   You are nauseated or vomiting.   You have a temperature over 101F.   You have abdominal pain or discomfort that is severe or gets worse throughout the day.   Gastritis  Gastritis is an inflammation (the body's way of reacting to injury and/or infection) of the stomach. It is often caused by  viral or bacterial (germ) infections. It can also be caused BY ASPIRIN, BC/GOODY POWDER'S, (IBUPROFEN) MOTRIN, OR ALEVE (NAPROXEN), chemicals (including alcohol), SPICY FOODS, and medications. This illness may be associated with generalized malaise (feeling tired, not well), UPPER ABDOMINAL STOMACH cramps, and fever. One common bacterial cause of gastritis is an organism known as H. Pylori. This can be treated with antibiotics.    High-Fiber Diet A high-fiber diet changes your normal diet to include more whole grains, legumes, fruits, and vegetables. Changes in the diet involve replacing refined  carbohydrates with unrefined foods. The calorie level of the diet is essentially unchanged. The Dietary Reference Intake (recommended amount) for adult males is 38 grams per day. For adult females, it is 25 grams per day. Pregnant and lactating women should consume 28 grams of fiber per day. Fiber is the intact part of a plant that is not broken down during digestion. Functional fiber is fiber that has been isolated from the plant to provide a beneficial effect in the body. PURPOSE  Increase stool bulk.   Ease and regulate bowel movements.   Lower cholesterol.  INDICATIONS THAT YOU NEED MORE FIBER  Constipation and hemorrhoids.   Uncomplicated diverticulosis (intestine condition) and irritable bowel syndrome.   Weight management.   As a protective measure against hardening of the arteries (atherosclerosis), diabetes, and cancer.   GUIDELINES FOR INCREASING FIBER IN THE DIET  Start adding fiber to the diet slowly. A gradual increase of about 5 more grams (2 slices of whole-wheat bread, 2 servings of most fruits or vegetables, or 1 bowl of high-fiber cereal) per day is best. Too rapid an increase in fiber may result in constipation, flatulence, and bloating.   Drink enough water and fluids to keep your urine clear or pale yellow. Water, juice, or caffeine-free drinks are recommended. Not drinking enough fluid may cause constipation.   Eat a variety of high-fiber foods rather than one type of fiber.   Try to increase your intake of fiber through using high-fiber foods rather than fiber pills or supplements that contain small amounts of fiber.   The goal is to change the types of food eaten. Do not supplement your present diet with high-fiber foods, but replace foods in your present diet.  INCLUDE A VARIETY OF FIBER SOURCES  Replace refined and processed grains with whole grains, canned fruits with fresh fruits, and incorporate other fiber sources. Analysia Dungee rice, Kieth Hartis breads, and most  bakery goods contain little or no fiber.   Brown whole-grain rice, buckwheat oats, and many fruits and vegetables are all good sources of fiber. These include: broccoli, Brussels sprouts, cabbage, cauliflower, beets, sweet potatoes, Shelvia Fojtik potatoes (skin on), carrots, tomatoes, eggplant, squash, berries, fresh fruits, and dried fruits.   Cereals appear to be the richest source of fiber. Cereal fiber is found in whole grains and bran. Bran is the fiber-rich outer coat of cereal grain, which is largely removed in refining. In whole-grain cereals, the bran remains. In breakfast cereals, the largest amount of fiber is found in those with "bran" in their names. The fiber content is sometimes indicated on the label.   You may need to include additional fruits and vegetables each day.   In baking, for 1 cup Fergie Sherbert flour, you may use the following substitutions:   1 cup whole-wheat flour minus 2 tablespoons.   1/2 cup Griffin Gerrard flour plus 1/2 cup whole-wheat flour.   Low-Fat Diet BREADS, CEREALS, PASTA, RICE, DRIED PEAS, AND BEANS These products are high in  carbohydrates and most are low in fat. Therefore, they can be increased in the diet as substitutes for fatty foods. They too, however, contain calories and should not be eaten in excess. Cereals can be eaten for snacks as well as for breakfast.  Include foods that contain fiber (fruits, vegetables, whole grains, and legumes). Research shows that fiber may lower blood cholesterol levels, especially the water-soluble fiber found in fruits, vegetables, oat products, and legumes. FRUITS AND VEGETABLES It is good to eat fruits and vegetables. Besides being sources of fiber, both are rich in vitamins and some minerals. They help you get the daily allowances of these nutrients. Fruits and vegetables can be used for snacks and desserts. MEATS Limit lean meat, chicken, [place], and fish to no more than 6 ounces per day. Beef, Pork, and Lamb Use lean cuts of beef,  pork, and lamb. Lean cuts include:  Extra-lean ground beef.  Arm roast.  Sirloin tip.  Center-cut ham.  Round steak.  Loin chops.  Rump roast.  Tenderloin.  Trim all fat off the outside of meats before cooking. It is not necessary to severely decrease the intake of red meat, but lean choices should be made. Lean meat is rich in protein and contains a highly absorbable form of iron. Premenopausal women, in particular, should avoid reducing lean red meat because this could increase the risk for low red blood cells (iron-deficiency anemia). The organ meats, such as liver, sweetbreads, kidneys, and brain are very rich in cholesterol. They should be limited. Chicken and [place] These are good sources of protein. The fat of poultry can be reduced by removing the skin and underlying fat layers before cooking. Chicken and [place] can be substituted for lean red meat in the diet. Poultry should not be fried or covered with high-fat sauces. Fish and Shellfish Fish is a good source of protein. Shellfish contain cholesterol, but they usually are low in saturated fatty acids. The preparation of fish is important. Like chicken and [place], they should not be fried or covered with high-fat sauces. EGGS Egg whites contain no fat or cholesterol. They can be eaten often. Try 1 to 2 egg whites instead of whole eggs in recipes or use egg substitutes that do not contain yolk. MILK AND DAIRY PRODUCTS Use skim or 1% milk instead of 2% or whole milk. Decrease whole milk, natural, and processed cheeses. Use nonfat or low-fat (2%) cottage cheese or low-fat cheeses made from vegetable oils. Choose nonfat or low-fat (1 to 2%) yogurt. Experiment with evaporated skim milk in recipes that call for heavy cream. Substitute low-fat yogurt or low-fat cottage cheese for sour cream in dips and salad dressings. Have at least 2 servings of low-fat dairy products, such as 2 glasses of skim (or 1%) milk each day to help get your daily  calcium intake.  FATS AND OILS Reduce the total intake of fats, especially saturated fat. Butterfat, lard, and beef fats are high in saturated fat and cholesterol. These should be avoided as much as possible. Vegetable fats do not contain cholesterol, but certain vegetable fats, such as coconut oil, palm oil, and palm kernel oil are very high in saturated fats. These should be limited. These fats are often used in bakery goods, processed foods, popcorn, oils, and nondairy creamers. Vegetable shortenings and some peanut butters contain hydrogenated oils, which are also saturated fats. Read the labels on these foods and check for saturated vegetable oils. Unsaturated vegetable oils and fats do not raise blood cholesterol. However,  they should be limited because they are fats and are high in calories. Total fat should still be limited to 30% of your daily caloric intake. Desirable liquid vegetable oils are corn oil, cottonseed oil, olive oil, canola oil, safflower oil, soybean oil, and sunflower oil. Peanut oil is not as good, but small amounts are acceptable. Buy a heart-healthy tub margarine that has no partially hydrogenated oils in the ingredients. Mayonnaise and salad dressings often are made from unsaturated fats, but they should also be limited because of their high calorie and fat content. Seeds, nuts, peanut butter, olives, and avocados are high in fat, but the fat is mainly the unsaturated type. These foods should be limited mainly to avoid excess calories and fat. OTHER EATING TIPS Snacks  Most sweets should be limited as snacks. They tend to be rich in calories and fats, and their caloric content outweighs their nutritional value. Some good choices in snacks are graham crackers, melba toast, soda crackers, bagels (no egg), English muffins, fruits, and vegetables. These snacks are preferable to snack crackers, [place] fries, and chips. Popcorn should be air-popped or cooked in small amounts of liquid  vegetable oil. Desserts Eat fruit, low-fat yogurt, and fruit ices. AVOID pastries, cake, and cookies. Sherbet, angel food cake, gelatin dessert, frozen low-fat yogurt, or other frozen products that do not contain saturated fat (pure fruit juice bars, frozen ice pops) are also acceptable.  COOKING METHODS Choose those methods that use little or no fat. They include: Poaching.  Braising.  Steaming.  Grilling.  Baking.  Stir-frying.  Broiling.  Microwaving.  Foods can be cooked in a nonstick pan without added fat, or use a nonfat cooking spray in regular cookware. Limit fried foods and avoid frying in saturated fat. Add moisture to lean meats by using water, broth, cooking wines, and other nonfat or low-fat sauces along with the cooking methods mentioned above. Soups and stews should be chilled after cooking. The fat that forms on top after a few hours in the refrigerator should be skimmed off. When preparing meals, avoid using excess salt. Salt can contribute to raising blood pressure in some people. EATING AWAY FROM HOME Order entres, potatoes, and vegetables without sauces or butter. When meat exceeds the size of a deck of cards (3 to 4 ounces), the rest can be taken home for another meal. Choose vegetable or fruit salads and ask for low-calorie salad dressings to be served on the side. Use dressings sparingly. Limit high-fat toppings, such as bacon, crumbled eggs, cheese, sunflower seeds, and olives. Ask for heart-healthy tub margarine instead of butter.  Hemorrhoids Hemorrhoids are dilated (enlarged) veins around the rectum. Sometimes clots will form in the veins. This makes them swollen and painful. These are called thrombosed hemorrhoids. Causes of hemorrhoids include:  Constipation.   Straining to have a bowel movement.   HEAVY LIFTING HOME CARE INSTRUCTIONS  Eat a well balanced diet and drink 6 to 8 glasses of water every day to avoid constipation. You may also use a bulk  laxative.   Avoid straining to have bowel movements.   Keep anal area dry and clean.   Do not use a donut shaped pillow or sit on the toilet for long periods. This increases blood pooling and pain.   Move your bowels when your body has the urge; this will require less straining and will decrease pain and pressure.

## 2014-07-31 NOTE — Telephone Encounter (Signed)
Sending to Dr. Oneida Alar for [place].

## 2014-07-31 NOTE — Anesthesia Postprocedure Evaluation (Signed)
  Anesthesia Post-op Note  Patient: Francisco Robinson  Procedure(s) Performed: Procedure(s) with comments: COLONOSCOPY WITH PROPOFOL (N/A) - In cecum @ 1046; cecal withdrawal time= 75min POLYPECTOMY (N/A) - cecal polyp, sigmoid colon polyp HEMORRHOID BANDING (N/A) ESOPHAGOGASTRODUODENOSCOPY (EGD) WITH PROPOFOL (N/A)  Patient Location: PACU  Anesthesia Type:MAC  Level of Consciousness: awake, alert , oriented and patient cooperative  Airway and Oxygen Therapy: Patient Spontanous Breathing  Post-op Pain: none  Post-op Assessment: Post-op Vital signs reviewed, Patient's Cardiovascular Status Stable, Respiratory Function Stable, Patent Airway and No signs of Nausea or vomiting  Post-op Vital Signs: Reviewed and stable  Last Vitals:  Filed Vitals:   07/31/14 1010  BP: 112/79  Pulse:   Temp:   Resp: 16    Complications: No apparent anesthesia complications

## 2014-07-31 NOTE — Telephone Encounter (Signed)
Pt called and said he had inner and outer hemorrhoids and he is in a lot of pain. Requesting something for pain. ( He had his colonoscopy this morning) Please advise!

## 2014-07-31 NOTE — Telephone Encounter (Signed)
REVIEWED-NO ADDITIONAL RECOMMENDATIONS. 

## 2014-07-31 NOTE — Telephone Encounter (Signed)
PLEASE CALL PT. If he is having pain, HE NEEDS HIS BANDS ADJUSTED NOT PAIN MEDS. HE should come back to the office today to have his hemorrhoid bands adjusted. PAGE ME WHEN HE ARRIVES.

## 2014-08-01 ENCOUNTER — Encounter (HOSPITAL_COMMUNITY): Payer: Self-pay | Admitting: Gastroenterology

## 2014-08-01 NOTE — Op Note (Addendum)
[facility] [address] [place], [address]   COLONOSCOPY PROCEDURE REPORT  PATIENT: Francisco Robinson, Francisco Robinson  MR#: [id number] BIRTHDATE: 01/08/1961 , 25  yrs. old GENDER: male ENDOSCOPIST: Barney Drain, MD REFERRED VQ:[id number] Fanta, M.D. PROCEDURE DATE:  08-28-2014 PROCEDURE:   Colonoscopy with snare polypectomy and Hemorrhoidectomy via banding, clips or ligation INDICATIONS:hematochezia. MEDICATIONS: Monitored anesthesia care  DESCRIPTION OF PROCEDURE:    Physical exam was performed.  Informed consent was obtained from the patient after explaining the benefits, risks, and alternatives to procedure.  The patient was connected to monitor and placed in left lateral position. Continuous oxygen was provided by nasal cannula and IV medicine administered through an indwelling cannula.  After administration of sedation and rectal exam, the patients rectum was intubated and the     colonoscope was advanced under direct visualization to the ileum.  The scope was removed slowly by carefully examining the color, texture, anatomy, and integrity mucosa on the way out.  The patient was recovered in endoscopy and discharged home in satisfactory condition.    COLON FINDINGS: The examined terminal ileum appeared to be normal. , Two sessile polyps ranging from 6 to 21mm in size were found in the rectum and at the cecum.  A polypectomy was performed using snare cautery.  , and Moderate sized internal hemorrhoids were found.    3 BANDS APPLIED.  PREP QUALITY: good. CECAL W/D TIME: 18       minutes  COMPLICATIONS: PT HAD DISCOMFORT AFTER BANDS PLACED 2 OF 3 BANDS ADJUSTED. PT D/C TO HOME. CALLED LATER AND CAME TO OFFICE DUE TO RECTAL PAIN. 3 BANDS ADJUSTED. ONE BAND REMOVED. RX VICODIN 5/325 #15 GIVEN.  ENDOSCOPIC IMPRESSION: 1.   2 COLON POLYPS REMOVED 2.  RECTAL BLEEDING DUE TO RECTAL POLYP/Moderate sized internal hemorrhoids  RECOMMENDATIONS: DRINK WATER SEE UROLOGY FOR  DIFFICULTY PASSING URINE. FOLLOW A HIGH FIBER DIET.  AVOID ITEMS THAT CAUSE BLOATING & GAS. CONTINUE LINZESS TO TREAT CONSTIPATION.  TAKE WITH BREAKFAST. START OMEPRAZOLE.  TAKE 30 MINUTES PRIOR TO BREAKFAST. AWAIT BIOPSY CALL IN ONE MONTH IF YOUR CONSTIPATION IS NOT BETTER. FOLLOW UP 11/29/13 @ 9:30 AM. Next colonoscopy in 1-3 years.AASISTERS, BROTHERS, CHILDREN, AND PARENTS NEED TO HAVE A COLONOSCOPY STARTING AT THE AGE OF 40. eSigned:  Barney Drain, MD 08/28/2014 7:33 PM  CPT CODES: ICD CODES:  The ICD and CPT codes recommended by this software are interpretations from the data that the clinical staff has captured with the software.  The verification of the translation of this report to the ICD and CPT codes and modifiers is the sole responsibility of the health care institution and practicing physician where this report was generated.  [employer]. will not be held responsible for the validity of the ICD and CPT codes included on this report.  AMA assumes no liability for data contained or not contained herein. CPT is a Designer, television/film set of the [employer].

## 2014-08-01 NOTE — Op Note (Addendum)
[facility] [address] [place], [address]   ENDOSCOPY PROCEDURE REPORT  PATIENT: Francisco Robinson, Francisco Robinson  MR#: [id number] BIRTHDATE: 1961-05-22 , 53  yrs. old GENDER: male  ENDOSCOPIST: Barney Drain, MD REFERRED VA:[id number] Fanta, M.D. PROCEDURE DATE: Aug 25, 2014 PROCEDURE:   EGD w/ biopsy INDICATIONS:screening for varices. pt reports PMHx: cirrhosis NL CBC. ELASTOGRAPHY F0/F1 IN SETTING OF CHRONIC HEP C. MEDICATIONS: Monitored anesthesia care TOPICAL ANESTHETIC:   Viscous Xylocaine ASA CLASS:  DESCRIPTION OF PROCEDURE:     Physical exam was performed.  Informed consent was obtained from the patient after explaining the benefits, risks, and alternatives to the procedure.  The patient was connected to the monitor and placed in the left lateral position.  Continuous oxygen was provided by nasal cannula and IV medicine administered through an indwelling cannula.  After administration of sedation, the patients esophagus was intubated and the     endoscope was advanced under direct visualization to the second portion of the duodenum.  The scope was removed slowly by carefully examining the color, texture, anatomy, and integrity of the mucosa on the way out.  The patient was recovered in endoscopy and discharged home in satisfactory condition.   ESOPHAGUS: MILD DISTAL ESOPHAGITIS.   There was a stricture at the gastroesophageal junction.  The stricture was easily traversable. STOMACH: Moderate non-erosive gastritis (inflammation) was found in the entire examined stomach.  Multiple biopsies were performed using cold forceps.   DUODENUM: The duodenal mucosa showed no abnormalities in the bulb and 2nd part of the duodenum. COMPLICATIONS: There were no immediate complications.  ENDOSCOPIC IMPRESSION: 1.   MILD DISTAL ESOPHAGITIS 2.   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   MODERATE Non-erosive gastritis  RECOMMENDATIONS: DRINK WATER TO KEEP YOUR URINE  LIGHT YELLOW. SEE UROLOGY FOR DIFFICULTY PASSING URINE. FOLLOW A HIGH FIBER DIET.  AVOID ITEMS THAT CAUSE BLOATING & GAS. CONTINUE LINZESS TO TREAT CONSTIPATION.  TAKE WITH BREAKFAST. START OMEPRAZOLE.  TAKE 30 MINUTES PRIOR TO BREAKFAST. AWAIT BIOPSY CALL IN ONE MONTH IF YOUR CONSTIPATION IS NOT BETTER. FOLLOW UP 11/29/13 @ 9:30 AM. Next colonoscopy in 1-3 years.  ALL SISTERS, BROTHERS, CHILDREN, AND PARENTS NEED TO HAVE A COLONOSCOPY STARTING AT THE AGE OF 40.  REPEAT EXAM: eSigned:  Barney Drain, MD August 25, 2014 7:26 PM  CPT CODES: ICD CODES:  The ICD and CPT codes recommended by this software are interpretations from the data that the clinical staff has captured with the software.  The verification of the translation of this report to the ICD and CPT codes and modifiers is the sole responsibility of the health care institution and practicing physician where this report was generated.  [employer] [place]. will not be held responsible for the validity of the ICD and CPT codes included on this report.  AMA assumes no liability for data contained or not contained herein. CPT is a Designer, television/film set of the [employer].

## 2014-09-12 ENCOUNTER — Telehealth: Payer: Self-pay | Admitting: Gastroenterology

## 2014-09-12 MED ORDER — BIS SUBCIT-METRONID-TETRACYC 140-125-125 MG PO CAPS: ORAL_CAPSULE | ORAL | Status: DC

## 2014-09-12 NOTE — Telephone Encounter (Signed)
Please call pt. He had A simple adenoma removed. HE has H. Pylori gastritis. TAKE ALL THE ABX AS PRESCRIBED. H PYLORI IF LEFT UNTREATED CAN LEAD TO STOMACH CANCER.  SINCE HE TAKES SEROQUEL SO HE NEEDS PYLERA 3 PILLS QID FOR 10 DAYS. HE should take OMEPRAZOLE BID. The meds can cause nausea, vomiting, abd cramps, loose stools, black colored stools, and metallic taste in her mouth.   DRINK WATER TO KEEP YOUR URINE LIGHT YELLOW.   FOLLOW A HIGH FIBER DIET. AVOID ITEMS THAT CAUSE BLOATING & GAS.   CONTINUE LINZESS TO TREAT CONSTIPATION. TAKE WITH BREAKFAST.  FOLLOW UP IN 4 MOS. 11/29/13 @ 9:30 AM.  Next colonoscopy in 3 years. YOUR SISTERS, BROTHERS, CHILDREN, AND PARENTS NEED TO HAVE A COLONOSCOPY STARTING AT THE AGE OF 40.

## 2014-09-12 NOTE — Telephone Encounter (Signed)
Pt is aware.  

## 2014-09-13 NOTE — Telephone Encounter (Signed)
Reminder in epic °

## 2014-09-21 ENCOUNTER — Telehealth: Payer: Self-pay | Admitting: Gastroenterology

## 2014-11-30 ENCOUNTER — Ambulatory Visit: Payer: Medicaid Other | Admitting: Gastroenterology

## 2014-11-30 ENCOUNTER — Telehealth: Payer: Self-pay | Admitting: Gastroenterology

## 2014-11-30 ENCOUNTER — Encounter: Payer: Self-pay | Admitting: Gastroenterology

## 2014-11-30 NOTE — Telephone Encounter (Signed)
PATIENT WAS A NO SHOW 11/30/14 AND LETTER WAS SENT

## 2015-01-02 LAB — HEPATIC FUNCTION PANEL
ALT: 40 U/L (ref 10–40)
AST: 48 U/L
Alkaline Phosphatase: 128 U/L
BILIRUBIN INDIRECT: 0.3
Bilirubin, Direct: 0.1
Total Bilirubin: 0.4 mg/dL

## 2015-01-02 LAB — CBC WITH DIFFERENTIAL/PLATELET
HCT: 42 %
Hemoglobin: 13.7 g/dL (ref 13–17)
PLATELETS: 187

## 2015-03-14 ENCOUNTER — Encounter: Payer: Self-pay | Admitting: Gastroenterology

## 2015-03-14 NOTE — Telephone Encounter (Signed)
MADE APPOINTMENT AND MAILED LETTER. ALSO L/M ON HIS CELL PHONE WITH APPT DATE AND TIME

## 2015-03-14 NOTE — Telephone Encounter (Signed)
Opened in error

## 2015-03-14 NOTE — Telephone Encounter (Signed)
Let's reach out to patient for another appt. Has chronic Hep C. Can see him in routine follow-up and hopefully be able to submit to insurance for treatment.

## 2015-04-04 ENCOUNTER — Telehealth: Payer: Self-pay

## 2015-04-04 NOTE — Telephone Encounter (Signed)
Dr. Wilkie Aye from dermatology in [place] called about pt. Wants to speak with provider here about pt Hep C.  Please call him at [phone]  States that pt needs to have a Hep C viral load blood test drawn

## 2015-04-05 NOTE — Telephone Encounter (Signed)
Patient has appt on 04/10/15

## 2015-04-05 NOTE — Telephone Encounter (Signed)
Attempted to call and had to leave generic message to return call.  Stacey: please try to see if patient can come in for a non-urgent appt. We have been trying to get up with him for awhile now.

## 2015-04-10 ENCOUNTER — Encounter: Payer: Self-pay | Admitting: Nurse Practitioner

## 2015-04-10 ENCOUNTER — Telehealth: Payer: Self-pay | Admitting: Nurse Practitioner

## 2015-04-10 ENCOUNTER — Ambulatory Visit: Payer: Self-pay | Admitting: Nurse Practitioner

## 2015-04-10 NOTE — Telephone Encounter (Signed)
PATIENT WAS A NO SHOW AND LETTER SENT  °

## 2015-04-10 NOTE — Telephone Encounter (Signed)
Noted  

## 2015-09-13 ENCOUNTER — Emergency Department (HOSPITAL_COMMUNITY)
Admission: EM | Admit: 2015-09-13 | Discharge: 2015-09-14 | Discharge status: Against Medical Advice | Payer: Medicaid Other | Attending: Emergency Medicine | Admitting: Emergency Medicine

## 2015-09-13 DIAGNOSIS — T50901A Poisoning by unspecified drugs, medicaments and biological substances, accidental (unintentional), initial encounter: Secondary | ICD-10-CM | POA: Insufficient documentation

## 2015-09-13 DIAGNOSIS — F419 Anxiety disorder, unspecified: Secondary | ICD-10-CM | POA: Insufficient documentation

## 2015-09-13 DIAGNOSIS — J449 Chronic obstructive pulmonary disease, unspecified: Secondary | ICD-10-CM | POA: Diagnosis not present

## 2015-09-13 DIAGNOSIS — Y998 Other external cause status: Secondary | ICD-10-CM | POA: Diagnosis not present

## 2015-09-13 DIAGNOSIS — Y92009 Unspecified place in unspecified non-institutional (private) residence as the place of occurrence of the external cause: Secondary | ICD-10-CM | POA: Insufficient documentation

## 2015-09-13 DIAGNOSIS — Z8673 Personal history of transient ischemic attack (TIA), and cerebral infarction without residual deficits: Secondary | ICD-10-CM | POA: Insufficient documentation

## 2015-09-13 DIAGNOSIS — M1990 Unspecified osteoarthritis, unspecified site: Secondary | ICD-10-CM | POA: Diagnosis not present

## 2015-09-13 DIAGNOSIS — I252 Old myocardial infarction: Secondary | ICD-10-CM | POA: Insufficient documentation

## 2015-09-13 DIAGNOSIS — F17210 Nicotine dependence, cigarettes, uncomplicated: Secondary | ICD-10-CM | POA: Diagnosis not present

## 2015-09-13 DIAGNOSIS — Z79899 Other long term (current) drug therapy: Secondary | ICD-10-CM | POA: Diagnosis not present

## 2015-09-13 DIAGNOSIS — F1010 Alcohol abuse, uncomplicated: Secondary | ICD-10-CM | POA: Diagnosis not present

## 2015-09-13 DIAGNOSIS — Z791 Long term (current) use of non-steroidal anti-inflammatories (NSAID): Secondary | ICD-10-CM | POA: Diagnosis not present

## 2015-09-13 DIAGNOSIS — Z8619 Personal history of other infectious and parasitic diseases: Secondary | ICD-10-CM | POA: Diagnosis not present

## 2015-09-13 DIAGNOSIS — E119 Type 2 diabetes mellitus without complications: Secondary | ICD-10-CM | POA: Diagnosis not present

## 2015-09-13 DIAGNOSIS — Y9389 Activity, other specified: Secondary | ICD-10-CM | POA: Insufficient documentation
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
## 2015-09-14 ENCOUNTER — Encounter (HOSPITAL_COMMUNITY): Payer: Self-pay | Admitting: *Deleted

## 2015-09-14 ENCOUNTER — Emergency Department (HOSPITAL_COMMUNITY): Payer: Medicaid Other

## 2015-09-14 LAB — COMPREHENSIVE METABOLIC PANEL
ALK PHOS: 97 U/L (ref 38–126)
ALT: 48 U/L (ref 17–63)
ANION GAP: 17 — AB (ref 5–15)
AST: 113 U/L — ABNORMAL HIGH (ref 15–41)
Albumin: 4.4 g/dL (ref 3.5–5.0)
BILIRUBIN TOTAL: 0.6 mg/dL (ref 0.3–1.2)
BUN: <5 mg/dL — ABNORMAL LOW (ref 6–20)
CALCIUM: 9.2 mg/dL (ref 8.9–10.3)
CO2: 22 mmol/L (ref 22–32)
CREATININE: 0.85 mg/dL (ref 0.61–1.24)
Chloride: 102 mmol/L (ref 101–111)
Glucose, Bld: 77 mg/dL (ref 65–99)
Potassium: 3.5 mmol/L (ref 3.5–5.1)
SODIUM: 141 mmol/L (ref 135–145)
TOTAL PROTEIN: 7.9 g/dL (ref 6.5–8.1)

## 2015-09-14 LAB — CBC WITH DIFFERENTIAL/PLATELET
Basophils Absolute: 0 10*3/uL (ref 0.0–0.1)
Basophils Relative: 0 %
Eosinophils Absolute: 0.1 10*3/uL (ref 0.0–0.7)
Eosinophils Relative: 1 %
HEMATOCRIT: 46.4 % (ref 39.0–52.0)
HEMOGLOBIN: 16.3 g/dL (ref 13.0–17.0)
LYMPHS ABS: 1.8 10*3/uL (ref 0.7–4.0)
LYMPHS PCT: 17 %
MCH: 33.7 pg (ref 26.0–34.0)
MCHC: 35.1 g/dL (ref 30.0–36.0)
MCV: 96.1 fL (ref 78.0–100.0)
MONOS PCT: 6 %
Monocytes Absolute: 0.7 10*3/uL (ref 0.1–1.0)
Neutro Abs: 8.2 10*3/uL — ABNORMAL HIGH (ref 1.7–7.7)
Neutrophils Relative %: 76 %
Platelets: 256 10*3/uL (ref 150–400)
RBC: 4.83 MIL/uL (ref 4.22–5.81)
RDW: 15 % (ref 11.5–15.5)
WBC: 10.8 10*3/uL — AB (ref 4.0–10.5)

## 2015-09-14 LAB — AMMONIA: Ammonia: 25 umol/L (ref 9–35)

## 2015-09-14 LAB — SALICYLATE LEVEL: Salicylate Lvl: <4 mg/dL (ref 2.8–30.0)

## 2015-09-14 LAB — PROTIME-INR
INR: 0.93 (ref 0.00–1.49)
PROTHROMBIN TIME: 12.7 s (ref 11.6–15.2)

## 2015-09-14 LAB — ACETAMINOPHEN LEVEL

## 2015-09-14 LAB — ETHANOL: Alcohol, Ethyl (B): 172 mg/dL — ABNORMAL HIGH (ref <5)

## 2015-09-14 LAB — TROPONIN I: Troponin I: <0.03 ng/mL (ref <0.031)

## 2015-09-14 NOTE — ED Notes (Signed)
Pt presents to er by [facility] with c/o "blacking out" pt reports that he has drunk approximately 18 beers and took one OxyContin due to his chronic pain, pt states that after he took the OxyContin he "blacked out"  On arrival to er, pt alert, able to answer questions, denies any thoughts of si/hi, pt states " I just drunk too much",

## 2015-09-14 NOTE — ED Notes (Addendum)
Ems started a 20 gauge iv in left hand had infiltrated on pt arrival to er, iv cath removed intact, Per ems report, pt was given narcan prior to arrival in er with improvement in mental status,

## 2015-09-14 NOTE — ED Notes (Signed)
Dr Betsey Holiday at bedside speaking with pt and friend, pt still requesting to leave,

## 2015-09-14 NOTE — ED Notes (Signed)
Pt refusing to stay, multiple attempts made by RN and Dr Betsey Holiday to convince pt to stay without success, friend with pt at bedside, ama explained to pt and friend at bedside, signed by pt,

## 2015-10-07 ENCOUNTER — Other Ambulatory Visit: Payer: Self-pay | Admitting: Gastroenterology

## 2016-01-27 ENCOUNTER — Other Ambulatory Visit: Payer: Self-pay

## 2016-01-27 ENCOUNTER — Encounter (HOSPITAL_COMMUNITY): Payer: Self-pay | Admitting: Emergency Medicine

## 2016-01-27 ENCOUNTER — Emergency Department (HOSPITAL_COMMUNITY): Payer: Medicaid Other

## 2016-01-27 ENCOUNTER — Emergency Department (HOSPITAL_COMMUNITY)
Admission: EM | Admit: 2016-01-27 | Discharge: 2016-01-27 | Disposition: A | Discharge status: Home / Self Care | Payer: Medicaid Other | Attending: Emergency Medicine | Admitting: Emergency Medicine

## 2016-01-27 DIAGNOSIS — J449 Chronic obstructive pulmonary disease, unspecified: Secondary | ICD-10-CM | POA: Insufficient documentation

## 2016-01-27 DIAGNOSIS — F10120 Alcohol abuse with intoxication, uncomplicated: Secondary | ICD-10-CM | POA: Diagnosis not present

## 2016-01-27 DIAGNOSIS — S51851A Open bite of right forearm, initial encounter: Secondary | ICD-10-CM | POA: Insufficient documentation

## 2016-01-27 DIAGNOSIS — E119 Type 2 diabetes mellitus without complications: Secondary | ICD-10-CM | POA: Diagnosis not present

## 2016-01-27 DIAGNOSIS — Z5321 Procedure and treatment not carried out due to patient leaving prior to being seen by health care provider: Secondary | ICD-10-CM | POA: Diagnosis not present

## 2016-01-27 DIAGNOSIS — W540XXA Bitten by dog, initial encounter: Secondary | ICD-10-CM | POA: Insufficient documentation

## 2016-01-27 DIAGNOSIS — F17210 Nicotine dependence, cigarettes, uncomplicated: Secondary | ICD-10-CM | POA: Insufficient documentation

## 2016-01-27 DIAGNOSIS — Y999 Unspecified external cause status: Secondary | ICD-10-CM | POA: Diagnosis not present

## 2016-01-27 DIAGNOSIS — I252 Old myocardial infarction: Secondary | ICD-10-CM | POA: Insufficient documentation

## 2016-01-27 DIAGNOSIS — M1990 Unspecified osteoarthritis, unspecified site: Secondary | ICD-10-CM | POA: Diagnosis not present

## 2016-01-27 DIAGNOSIS — Z8673 Personal history of transient ischemic attack (TIA), and cerebral infarction without residual deficits: Secondary | ICD-10-CM | POA: Insufficient documentation

## 2016-01-27 DIAGNOSIS — Y939 Activity, unspecified: Secondary | ICD-10-CM | POA: Insufficient documentation

## 2016-01-27 DIAGNOSIS — Y929 Unspecified place or not applicable: Secondary | ICD-10-CM | POA: Insufficient documentation

## 2016-01-27 LAB — BASIC METABOLIC PANEL WITH GFR
Anion gap: 9 (ref 5–15)
BUN: <5 mg/dL — ABNORMAL LOW (ref 6–20)
CO2: 25 mmol/L (ref 22–32)
Calcium: 8.6 mg/dL — ABNORMAL LOW (ref 8.9–10.3)
Chloride: 110 mmol/L (ref 101–111)
Creatinine, Ser: 0.72 mg/dL (ref 0.61–1.24)
GFR calc Af Amer: >60 mL/min
GFR calc non Af Amer: >60 mL/min
Glucose, Bld: 90 mg/dL (ref 65–99)
Potassium: 4.1 mmol/L (ref 3.5–5.1)
Sodium: 144 mmol/L (ref 135–145)

## 2016-01-27 LAB — CBC
HCT: 44.3 % (ref 39.0–52.0)
Hemoglobin: 15 g/dL (ref 13.0–17.0)
MCH: 33 pg (ref 26.0–34.0)
MCHC: 33.9 g/dL (ref 30.0–36.0)
MCV: 97.4 fL (ref 78.0–100.0)
Platelets: 249 K/uL (ref 150–400)
RBC: 4.55 MIL/uL (ref 4.22–5.81)
RDW: 15.6 % — ABNORMAL HIGH (ref 11.5–15.5)
WBC: 6.1 K/uL (ref 4.0–10.5)

## 2016-01-27 NOTE — ED Notes (Signed)
Pt denies any chest pain. States he is here to be seen for a dog bite to his R forearm and that he wants help for ETOH abuse. States he is here for detox.

## 2016-01-27 NOTE — ED Notes (Signed)
Pt leaving before seen by MD.
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
## 2016-02-28 ENCOUNTER — Emergency Department (HOSPITAL_COMMUNITY)
Admission: EM | Admit: 2016-02-28 | Discharge: 2016-02-28 | Discharge status: Against Medical Advice | Payer: Medicaid Other | Attending: Emergency Medicine | Admitting: Emergency Medicine

## 2016-02-28 ENCOUNTER — Encounter (HOSPITAL_COMMUNITY): Payer: Self-pay | Admitting: *Deleted

## 2016-02-28 DIAGNOSIS — Z8669 Personal history of other diseases of the nervous system and sense organs: Secondary | ICD-10-CM | POA: Insufficient documentation

## 2016-02-28 DIAGNOSIS — Z8673 Personal history of transient ischemic attack (TIA), and cerebral infarction without residual deficits: Secondary | ICD-10-CM | POA: Insufficient documentation

## 2016-02-28 DIAGNOSIS — Z5321 Procedure and treatment not carried out due to patient leaving prior to being seen by health care provider: Secondary | ICD-10-CM | POA: Insufficient documentation

## 2016-02-28 DIAGNOSIS — F10239 Alcohol dependence with withdrawal, unspecified: Secondary | ICD-10-CM | POA: Insufficient documentation

## 2016-02-28 DIAGNOSIS — F17210 Nicotine dependence, cigarettes, uncomplicated: Secondary | ICD-10-CM | POA: Diagnosis not present

## 2016-02-28 DIAGNOSIS — I252 Old myocardial infarction: Secondary | ICD-10-CM | POA: Insufficient documentation

## 2016-02-28 DIAGNOSIS — J449 Chronic obstructive pulmonary disease, unspecified: Secondary | ICD-10-CM | POA: Insufficient documentation

## 2016-02-28 DIAGNOSIS — E119 Type 2 diabetes mellitus without complications: Secondary | ICD-10-CM | POA: Insufficient documentation

## 2016-02-28 DIAGNOSIS — M1990 Unspecified osteoarthritis, unspecified site: Secondary | ICD-10-CM | POA: Diagnosis not present

## 2016-02-28 NOTE — ED Notes (Signed)
Pt states he wants to come off of alcohol and drugs. Pt states he mostly drinks beer with his last drink being 1600.  Pt states he uses drugs as well. He admits to cocaine and marijuana use yesterday. Pt states he was here for an overdose in the last 6 months. Pt denies any SI, HI, or AVH.

## 2016-02-28 NOTE — ED Notes (Signed)
Pt no longer in room, CN is aware of leaving

## 2017-04-15 ENCOUNTER — Encounter (HOSPITAL_COMMUNITY): Payer: Self-pay | Admitting: Emergency Medicine

## 2017-04-15 ENCOUNTER — Emergency Department (HOSPITAL_COMMUNITY): Payer: Medicaid Other

## 2017-04-15 ENCOUNTER — Emergency Department (HOSPITAL_COMMUNITY)
Admission: EM | Admit: 2017-04-15 | Discharge: 2017-04-15 | Disposition: A | Discharge status: Home / Self Care | Payer: Medicaid Other | Attending: Emergency Medicine | Admitting: Emergency Medicine

## 2017-04-15 DIAGNOSIS — Z5321 Procedure and treatment not carried out due to patient leaving prior to being seen by health care provider: Secondary | ICD-10-CM | POA: Diagnosis not present

## 2017-04-15 DIAGNOSIS — M79641 Pain in right hand: Secondary | ICD-10-CM | POA: Diagnosis not present

## 2017-04-15 NOTE — ED Notes (Signed)
Pt not in room, looked in restrooms, waiting room and parking lot.  Did not see pt.  edp notified.
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
## 2017-04-21 ENCOUNTER — Encounter (HOSPITAL_COMMUNITY): Payer: Self-pay | Admitting: *Deleted

## 2017-04-21 ENCOUNTER — Emergency Department (HOSPITAL_COMMUNITY)
Admission: EM | Admit: 2017-04-21 | Discharge: 2017-04-21 | Disposition: A | Discharge status: Home / Self Care | Payer: Medicaid Other | Attending: Emergency Medicine | Admitting: Emergency Medicine

## 2017-04-21 ENCOUNTER — Emergency Department (HOSPITAL_COMMUNITY): Payer: Medicaid Other

## 2017-04-21 DIAGNOSIS — J449 Chronic obstructive pulmonary disease, unspecified: Secondary | ICD-10-CM | POA: Diagnosis not present

## 2017-04-21 DIAGNOSIS — F17210 Nicotine dependence, cigarettes, uncomplicated: Secondary | ICD-10-CM | POA: Diagnosis not present

## 2017-04-21 DIAGNOSIS — S60221A Contusion of right hand, initial encounter: Secondary | ICD-10-CM | POA: Diagnosis not present

## 2017-04-21 DIAGNOSIS — Z791 Long term (current) use of non-steroidal anti-inflammatories (NSAID): Secondary | ICD-10-CM | POA: Diagnosis not present

## 2017-04-21 DIAGNOSIS — E119 Type 2 diabetes mellitus without complications: Secondary | ICD-10-CM | POA: Insufficient documentation

## 2017-04-21 DIAGNOSIS — Y999 Unspecified external cause status: Secondary | ICD-10-CM | POA: Diagnosis not present

## 2017-04-21 DIAGNOSIS — Z202 Contact with and (suspected) exposure to infections with a predominantly sexual mode of transmission: Secondary | ICD-10-CM | POA: Insufficient documentation

## 2017-04-21 DIAGNOSIS — Y939 Activity, unspecified: Secondary | ICD-10-CM | POA: Insufficient documentation

## 2017-04-21 DIAGNOSIS — Y929 Unspecified place or not applicable: Secondary | ICD-10-CM | POA: Insufficient documentation

## 2017-04-21 DIAGNOSIS — Z79899 Other long term (current) drug therapy: Secondary | ICD-10-CM | POA: Insufficient documentation

## 2017-04-21 DIAGNOSIS — S6991XA Unspecified injury of right wrist, hand and finger(s), initial encounter: Secondary | ICD-10-CM | POA: Diagnosis present

## 2017-04-21 MED ORDER — IBUPROFEN 800 MG PO TABS
800.0000 mg | ORAL_TABLET | Freq: Once | ORAL | Status: AC
  Administered 2017-04-21: 800 mg via ORAL
  Filled 2017-04-21: qty 1

## 2017-04-21 NOTE — ED Provider Notes (Signed)
Pt declines blood testing at this time   Ripley Fraise, MD 04/21/17 [phone]

## 2017-04-21 NOTE — ED Triage Notes (Addendum)
Pt c/o right hand pain after closing his hand in the car door; pt states he would also like to be tested for STD's, states he has been having unprotected sex
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
## 2017-04-22 LAB — GC/CHLAMYDIA PROBE AMP (~~LOC~~) NOT AT ARMC
CHLAMYDIA, DNA PROBE: NEGATIVE
Neisseria Gonorrhea: NEGATIVE

## 2017-04-30 ENCOUNTER — Emergency Department (HOSPITAL_COMMUNITY)
Admission: EM | Admit: 2017-04-30 | Discharge: 2017-04-30 | Disposition: A | Discharge status: Home / Self Care | Payer: Medicaid Other

## 2017-08-12 ENCOUNTER — Encounter: Payer: Self-pay | Admitting: Gastroenterology

## 2017-09-22 NOTE — Progress Notes (Signed)
REVIEWED-NO ADDITIONAL RECOMMENDATIONS. 

## 2017-11-17 ENCOUNTER — Other Ambulatory Visit: Payer: Self-pay
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
## 2018-02-23 ENCOUNTER — Other Ambulatory Visit: Payer: Self-pay

## 2018-02-23 ENCOUNTER — Encounter (HOSPITAL_COMMUNITY): Payer: Self-pay

## 2018-02-23 ENCOUNTER — Emergency Department (HOSPITAL_COMMUNITY)
Admission: EM | Admit: 2018-02-23 | Discharge: 2018-02-23 | Discharge status: Against Medical Advice | Payer: Medicaid Other | Attending: Emergency Medicine | Admitting: Emergency Medicine

## 2018-02-23 DIAGNOSIS — Z8673 Personal history of transient ischemic attack (TIA), and cerebral infarction without residual deficits: Secondary | ICD-10-CM | POA: Insufficient documentation

## 2018-02-23 DIAGNOSIS — L299 Pruritus, unspecified: Secondary | ICD-10-CM | POA: Diagnosis not present

## 2018-02-23 DIAGNOSIS — J449 Chronic obstructive pulmonary disease, unspecified: Secondary | ICD-10-CM | POA: Diagnosis not present

## 2018-02-23 DIAGNOSIS — I252 Old myocardial infarction: Secondary | ICD-10-CM | POA: Diagnosis not present

## 2018-02-23 DIAGNOSIS — E119 Type 2 diabetes mellitus without complications: Secondary | ICD-10-CM | POA: Insufficient documentation

## 2018-02-23 DIAGNOSIS — Z79899 Other long term (current) drug therapy: Secondary | ICD-10-CM | POA: Insufficient documentation

## 2018-02-23 DIAGNOSIS — F17210 Nicotine dependence, cigarettes, uncomplicated: Secondary | ICD-10-CM | POA: Insufficient documentation

## 2018-02-23 LAB — COMPREHENSIVE METABOLIC PANEL
ALBUMIN: 3.9 g/dL (ref 3.5–5.0)
ALT: 27 U/L (ref 0–44)
ANION GAP: 7 (ref 5–15)
AST: 48 U/L — ABNORMAL HIGH (ref 15–41)
Alkaline Phosphatase: 78 U/L (ref 38–126)
BUN: <5 mg/dL — ABNORMAL LOW (ref 6–20)
CO2: 24 mmol/L (ref 22–32)
Calcium: 8.9 mg/dL (ref 8.9–10.3)
Chloride: 111 mmol/L (ref 98–111)
Creatinine, Ser: 0.76 mg/dL (ref 0.61–1.24)
GFR calc Af Amer: >60 mL/min (ref >60)
GFR calc non Af Amer: >60 mL/min (ref >60)
GLUCOSE: 104 mg/dL — AB (ref 70–99)
POTASSIUM: 3.3 mmol/L — AB (ref 3.5–5.1)
SODIUM: 142 mmol/L (ref 135–145)
Total Bilirubin: 0.3 mg/dL (ref 0.3–1.2)
Total Protein: 7 g/dL (ref 6.5–8.1)

## 2018-02-23 LAB — CBC WITH DIFFERENTIAL/PLATELET
BASOS PCT: 0 %
Basophils Absolute: 0 10*3/uL (ref 0.0–0.1)
EOS ABS: 0.1 10*3/uL (ref 0.0–0.7)
EOS PCT: 2 %
HCT: 40.4 % (ref 39.0–52.0)
HEMOGLOBIN: 13.8 g/dL (ref 13.0–17.0)
Lymphocytes Relative: 41 %
Lymphs Abs: 2.8 10*3/uL (ref 0.7–4.0)
MCH: 34.5 pg — ABNORMAL HIGH (ref 26.0–34.0)
MCHC: 34.2 g/dL (ref 30.0–36.0)
MCV: 101 fL — ABNORMAL HIGH (ref 78.0–100.0)
MONO ABS: 0.5 10*3/uL (ref 0.1–1.0)
Monocytes Relative: 7 %
NEUTROS PCT: 50 %
Neutro Abs: 3.4 10*3/uL (ref 1.7–7.7)
PLATELETS: 196 10*3/uL (ref 150–400)
RBC: 4 MIL/uL — ABNORMAL LOW (ref 4.22–5.81)
RDW: 13.6 % (ref 11.5–15.5)
WBC: 6.8 10*3/uL (ref 4.0–10.5)

## 2018-02-23 LAB — ETHANOL: Alcohol, Ethyl (B): 210 mg/dL — ABNORMAL HIGH (ref <10)

## 2018-02-23 LAB — AMMONIA: Ammonia: 35 umol/L (ref 9–35)

## 2018-02-23 MED ORDER — DIPHENHYDRAMINE HCL 25 MG PO CAPS
50.0000 mg | ORAL_CAPSULE | Freq: Once | ORAL | Status: AC
Start: 2018-02-23 — End: 2018-02-23
  Administered 2018-02-23: 50 mg via ORAL
  Filled 2018-02-23: qty 2

## 2018-02-23 MED ORDER — DEXAMETHASONE SODIUM PHOSPHATE 4 MG/ML IJ SOLN
10.0000 mg | Freq: Once | INTRAMUSCULAR | Status: AC
  Administered 2018-02-23: 10 mg via INTRAMUSCULAR
  Filled 2018-02-23: qty 3

## 2018-02-23 MED ORDER — POTASSIUM CHLORIDE CRYS ER 20 MEQ PO TBCR
40.0000 meq | EXTENDED_RELEASE_TABLET | Freq: Once | ORAL | Status: AC
  Administered 2018-02-23: 40 meq via ORAL
  Filled 2018-02-23: qty 2
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
## 2018-02-23 NOTE — ED Notes (Signed)
Pt given dinner tray per request.

## 2018-02-23 NOTE — ED Triage Notes (Signed)
Pt reports "itching all over", pt has red bumps that are scabbed over generalized over all body. Pt reports they have been there for about 2 weeks. Pt says, "their is something under my skin that itches bad" Pt appears intoxicated and reports he has drank at least a 12 pack.

## 2018-02-23 NOTE — ED Notes (Signed)
Pt become agitated, cursing. States wanted to leave now. Yelling "Yall ain't doing nothing for me!" I'm recording ya'll, don't be surprised when you on [employer]. Security in to talk to patient, stated wanted to leave, notified ED provider. Patient escorted out by security

## 2018-03-21 ENCOUNTER — Emergency Department (HOSPITAL_COMMUNITY): Payer: Medicaid Other

## 2018-03-21 ENCOUNTER — Emergency Department (HOSPITAL_COMMUNITY)
Admission: EM | Admit: 2018-03-21 | Discharge: 2018-03-21 | Discharge status: Against Medical Advice | Payer: Medicaid Other | Attending: Emergency Medicine | Admitting: Emergency Medicine

## 2018-03-21 ENCOUNTER — Encounter (HOSPITAL_COMMUNITY): Payer: Self-pay | Admitting: Emergency Medicine

## 2018-03-21 ENCOUNTER — Other Ambulatory Visit: Payer: Self-pay

## 2018-03-21 DIAGNOSIS — Z8673 Personal history of transient ischemic attack (TIA), and cerebral infarction without residual deficits: Secondary | ICD-10-CM | POA: Insufficient documentation

## 2018-03-21 DIAGNOSIS — Z79899 Other long term (current) drug therapy: Secondary | ICD-10-CM | POA: Insufficient documentation

## 2018-03-21 DIAGNOSIS — R52 Pain, unspecified: Secondary | ICD-10-CM

## 2018-03-21 DIAGNOSIS — M79672 Pain in left foot: Secondary | ICD-10-CM | POA: Diagnosis not present

## 2018-03-21 DIAGNOSIS — J449 Chronic obstructive pulmonary disease, unspecified: Secondary | ICD-10-CM | POA: Diagnosis not present

## 2018-03-21 DIAGNOSIS — N50812 Left testicular pain: Secondary | ICD-10-CM | POA: Diagnosis present

## 2018-03-21 DIAGNOSIS — I252 Old myocardial infarction: Secondary | ICD-10-CM | POA: Diagnosis not present

## 2018-03-21 DIAGNOSIS — E119 Type 2 diabetes mellitus without complications: Secondary | ICD-10-CM | POA: Diagnosis not present

## 2018-03-21 DIAGNOSIS — R1032 Left lower quadrant pain: Secondary | ICD-10-CM | POA: Insufficient documentation

## 2018-03-21 DIAGNOSIS — F17210 Nicotine dependence, cigarettes, uncomplicated: Secondary | ICD-10-CM | POA: Insufficient documentation

## 2018-03-21 LAB — URINALYSIS, ROUTINE W REFLEX MICROSCOPIC
BILIRUBIN URINE: NEGATIVE
GLUCOSE, UA: NEGATIVE mg/dL
HGB URINE DIPSTICK: NEGATIVE
Ketones, ur: NEGATIVE mg/dL
Leukocytes, UA: NEGATIVE
Nitrite: NEGATIVE
PROTEIN: NEGATIVE mg/dL
Specific Gravity, Urine: 1.001 — ABNORMAL LOW (ref 1.005–1.030)
pH: 6 (ref 5.0–8.0)

## 2018-03-21 MED ORDER — HYDROMORPHONE HCL 1 MG/ML IJ SOLN
1.0000 mg | Freq: Once | INTRAMUSCULAR | Status: AC
  Administered 2018-03-21: 1 mg via INTRAMUSCULAR
  Filled 2018-03-21: qty 1

## 2018-03-21 NOTE — ED Triage Notes (Addendum)
Pt c/o left testicle pain x 2 months and states he has glass in his left foot. Pt has also been drinking today.

## 2018-03-21 NOTE — ED Notes (Signed)
Pt states he wants to leave at this time, states understanding of AMA document.

## 2018-03-21 NOTE — ED Notes (Signed)
Pt called no answer 

## 2018-03-21 NOTE — ED Notes (Signed)
Called no answer
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
## 2018-06-30 ENCOUNTER — Emergency Department (HOSPITAL_COMMUNITY)
Admission: EM | Admit: 2018-06-30 | Discharge: 2018-06-30 | Disposition: A | Discharge status: Home / Self Care | Payer: Medicaid Other | Attending: Emergency Medicine | Admitting: Emergency Medicine

## 2018-06-30 ENCOUNTER — Encounter (HOSPITAL_COMMUNITY): Payer: Self-pay | Admitting: Emergency Medicine

## 2018-06-30 ENCOUNTER — Other Ambulatory Visit: Payer: Self-pay

## 2018-06-30 DIAGNOSIS — Z5321 Procedure and treatment not carried out due to patient leaving prior to being seen by health care provider: Secondary | ICD-10-CM | POA: Diagnosis not present

## 2018-06-30 DIAGNOSIS — R21 Rash and other nonspecific skin eruption: Secondary | ICD-10-CM | POA: Diagnosis not present

## 2018-06-30 NOTE — ED Notes (Signed)
Patient not in his room mat this time.
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
## 2018-08-08 ENCOUNTER — Encounter (HOSPITAL_COMMUNITY): Payer: Self-pay

## 2018-08-08 ENCOUNTER — Emergency Department (HOSPITAL_COMMUNITY)
Admission: EM | Admit: 2018-08-08 | Discharge: 2018-08-08 | Disposition: A | Discharge status: Home / Self Care | Payer: Medicaid Other | Attending: Emergency Medicine | Admitting: Emergency Medicine

## 2018-08-08 ENCOUNTER — Other Ambulatory Visit: Payer: Self-pay

## 2018-08-08 DIAGNOSIS — Z79899 Other long term (current) drug therapy: Secondary | ICD-10-CM | POA: Diagnosis not present

## 2018-08-08 DIAGNOSIS — F17210 Nicotine dependence, cigarettes, uncomplicated: Secondary | ICD-10-CM | POA: Diagnosis not present

## 2018-08-08 DIAGNOSIS — Z8673 Personal history of transient ischemic attack (TIA), and cerebral infarction without residual deficits: Secondary | ICD-10-CM | POA: Insufficient documentation

## 2018-08-08 DIAGNOSIS — F419 Anxiety disorder, unspecified: Secondary | ICD-10-CM | POA: Insufficient documentation

## 2018-08-08 DIAGNOSIS — R21 Rash and other nonspecific skin eruption: Secondary | ICD-10-CM | POA: Diagnosis present

## 2018-08-08 DIAGNOSIS — B86 Scabies: Secondary | ICD-10-CM | POA: Insufficient documentation

## 2018-08-08 DIAGNOSIS — F1010 Alcohol abuse, uncomplicated: Secondary | ICD-10-CM | POA: Insufficient documentation

## 2018-08-08 DIAGNOSIS — E119 Type 2 diabetes mellitus without complications: Secondary | ICD-10-CM | POA: Insufficient documentation

## 2018-08-08 DIAGNOSIS — J449 Chronic obstructive pulmonary disease, unspecified: Secondary | ICD-10-CM | POA: Insufficient documentation

## 2018-08-08 DIAGNOSIS — I252 Old myocardial infarction: Secondary | ICD-10-CM | POA: Diagnosis not present

## 2018-08-08 MED ORDER — PERMETHRIN 5 % EX CREA: TOPICAL_CREAM | CUTANEOUS | 1 refills | Status: DC

## 2018-08-08 MED ORDER — HYDROXYZINE HCL 10 MG PO TABS: 10.0000 mg | ORAL_TABLET | Freq: Three times a day (TID) | ORAL | 0 refills | Status: DC | PRN

## 2018-08-08 NOTE — Clinical Social Work Note (Signed)
Met with pt per nurse request, who states he has MCD, but is unable to afford meds as he is out of money until January when he gets his disability check again.  He is particularly concerned about creme to address itch related to scabies.  Took a collection of $3.00, gave to patient for copay.
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
## 2018-08-08 NOTE — ED Notes (Signed)
[place] with social work. Pt states he is on disability and cannot afford medication to treat his scabies.

## 2018-08-08 NOTE — Discharge Instructions (Addendum)
Follow-up with your doctor or the health department for more comprehensive STD testing.  Call the hospital for your results in 5 days. Apply Elimite cream as directed.  It is critical that Francisco Robinson wash all bedding and clothing to prevent reinfecting herself. Take Atarax as needed as prescribed for itching.

## 2018-08-08 NOTE — ED Triage Notes (Signed)
Pt reports rash to back and neck x 1 month.    Reports itching and burning.  Pt says he wants to be checked for aids and any other kind of STD.  Pt also says left testicle is swollen and painful x 1 month.  Denies any penile discharge.

## 2018-08-08 NOTE — ED Notes (Signed)
Social work at bedside.  

## 2018-08-09 LAB — RPR: RPR Ser Ql: NONREACTIVE

## 2018-08-12 LAB — HIV 1/2 AB DIFFERENTIATION
HIV 1 Ab: NEGATIVE
HIV 2 AB: NEGATIVE
NOTE (HIV CONF MULTISPOT): NEGATIVE

## 2018-08-12 LAB — RNA QUALITATIVE

## 2018-08-12 LAB — HIV ANTIBODY (ROUTINE TESTING W REFLEX): HIV Screen 4th Generation wRfx: REACTIVE — AB

## 2018-09-18 NOTE — ED Notes (Signed)
Spoke with pt and contact number updated to correct number.  Informed pt he needed to go to the health department first thing tomorrow morning for confirmative HIV testing per Dr. Lacinda Axon.  Pt agreed with this plan and Dr. Lacinda Axon updated on this.

## 2018-09-18 NOTE — ED Notes (Signed)
Dr. Lacinda Axon called requesting help following up with the positive result of this patient.  Unable to contact pt via the phone numbers in chart.  PD sent to address for wellness check and to have pt contact the ED at physician request.  [facility] Tim also notified and in agreement with this plan.

## 2019-02-13 ENCOUNTER — Emergency Department (HOSPITAL_COMMUNITY)
Admission: EM | Admit: 2019-02-13 | Discharge: 2019-02-13 | Discharge status: Against Medical Advice | Payer: Medicaid Other

## 2019-02-13 NOTE — ED Notes (Signed)
Patient called to triage no answer.

## 2021-06-14 ENCOUNTER — Inpatient Hospital Stay (HOSPITAL_COMMUNITY)
Admission: EM | Admit: 2021-06-14 | Discharge: 2021-06-19 | DRG: 291 | Disposition: A | Discharge status: Home Health | Payer: Medicaid Other | Attending: Internal Medicine | Admitting: Internal Medicine

## 2021-06-14 ENCOUNTER — Other Ambulatory Visit: Payer: Self-pay

## 2021-06-14 ENCOUNTER — Emergency Department (HOSPITAL_COMMUNITY): Payer: Medicaid Other

## 2021-06-14 ENCOUNTER — Encounter (HOSPITAL_COMMUNITY): Payer: Self-pay

## 2021-06-14 DIAGNOSIS — F10231 Alcohol dependence with withdrawal delirium: Secondary | ICD-10-CM | POA: Diagnosis not present

## 2021-06-14 DIAGNOSIS — E871 Hypo-osmolality and hyponatremia: Secondary | ICD-10-CM | POA: Diagnosis not present

## 2021-06-14 DIAGNOSIS — I2510 Atherosclerotic heart disease of native coronary artery without angina pectoris: Secondary | ICD-10-CM | POA: Diagnosis present

## 2021-06-14 DIAGNOSIS — Z91199 Patient's noncompliance with other medical treatment and regimen due to unspecified reason: Secondary | ICD-10-CM | POA: Diagnosis not present

## 2021-06-14 DIAGNOSIS — F17210 Nicotine dependence, cigarettes, uncomplicated: Secondary | ICD-10-CM | POA: Diagnosis present

## 2021-06-14 DIAGNOSIS — F1011 Alcohol abuse, in remission: Secondary | ICD-10-CM | POA: Diagnosis not present

## 2021-06-14 DIAGNOSIS — E785 Hyperlipidemia, unspecified: Secondary | ICD-10-CM | POA: Diagnosis present

## 2021-06-14 DIAGNOSIS — I252 Old myocardial infarction: Secondary | ICD-10-CM | POA: Diagnosis not present

## 2021-06-14 DIAGNOSIS — M1990 Unspecified osteoarthritis, unspecified site: Secondary | ICD-10-CM | POA: Diagnosis present

## 2021-06-14 DIAGNOSIS — Z79899 Other long term (current) drug therapy: Secondary | ICD-10-CM | POA: Diagnosis not present

## 2021-06-14 DIAGNOSIS — B1920 Unspecified viral hepatitis C without hepatic coma: Secondary | ICD-10-CM | POA: Diagnosis present

## 2021-06-14 DIAGNOSIS — I5043 Acute on chronic combined systolic (congestive) and diastolic (congestive) heart failure: Secondary | ICD-10-CM | POA: Diagnosis present

## 2021-06-14 DIAGNOSIS — R0600 Dyspnea, unspecified: Secondary | ICD-10-CM | POA: Diagnosis not present

## 2021-06-14 DIAGNOSIS — Z20822 Contact with and (suspected) exposure to covid-19: Secondary | ICD-10-CM | POA: Diagnosis present

## 2021-06-14 DIAGNOSIS — J449 Chronic obstructive pulmonary disease, unspecified: Secondary | ICD-10-CM | POA: Diagnosis present

## 2021-06-14 DIAGNOSIS — Z8673 Personal history of transient ischemic attack (TIA), and cerebral infarction without residual deficits: Secondary | ICD-10-CM

## 2021-06-14 DIAGNOSIS — E119 Type 2 diabetes mellitus without complications: Secondary | ICD-10-CM | POA: Diagnosis present

## 2021-06-14 DIAGNOSIS — R0689 Other abnormalities of breathing: Secondary | ICD-10-CM

## 2021-06-14 DIAGNOSIS — J441 Chronic obstructive pulmonary disease with (acute) exacerbation: Secondary | ICD-10-CM | POA: Diagnosis not present

## 2021-06-14 DIAGNOSIS — G40909 Epilepsy, unspecified, not intractable, without status epilepticus: Secondary | ICD-10-CM | POA: Diagnosis present

## 2021-06-14 DIAGNOSIS — F10931 Alcohol use, unspecified with withdrawal delirium: Secondary | ICD-10-CM | POA: Diagnosis present

## 2021-06-14 DIAGNOSIS — J9601 Acute respiratory failure with hypoxia: Secondary | ICD-10-CM | POA: Diagnosis not present

## 2021-06-14 DIAGNOSIS — R569 Unspecified convulsions: Secondary | ICD-10-CM

## 2021-06-14 DIAGNOSIS — I5020 Unspecified systolic (congestive) heart failure: Secondary | ICD-10-CM

## 2021-06-14 DIAGNOSIS — I5030 Unspecified diastolic (congestive) heart failure: Secondary | ICD-10-CM | POA: Diagnosis not present

## 2021-06-14 LAB — COMPREHENSIVE METABOLIC PANEL
ALT: 58 U/L — ABNORMAL HIGH (ref 0–44)
AST: 153 U/L — ABNORMAL HIGH (ref 15–41)
Albumin: 3.5 g/dL (ref 3.5–5.0)
Alkaline Phosphatase: 156 U/L — ABNORMAL HIGH (ref 38–126)
Anion gap: 10 (ref 5–15)
BUN: 5 mg/dL — ABNORMAL LOW (ref 6–20)
CO2: 23 mmol/L (ref 22–32)
Calcium: 8.4 mg/dL — ABNORMAL LOW (ref 8.9–10.3)
Chloride: 100 mmol/L (ref 98–111)
Creatinine, Ser: 0.62 mg/dL (ref 0.61–1.24)
GFR, Estimated: >60 mL/min (ref >60)
Glucose, Bld: 152 mg/dL — ABNORMAL HIGH (ref 70–99)
Potassium: 3.8 mmol/L (ref 3.5–5.1)
Sodium: 133 mmol/L — ABNORMAL LOW (ref 135–145)
Total Bilirubin: 0.7 mg/dL (ref 0.3–1.2)
Total Protein: 7.5 g/dL (ref 6.5–8.1)

## 2021-06-14 LAB — RESP PANEL BY RT-PCR (FLU A&B, COVID) ARPGX2
Influenza A by PCR: NEGATIVE
Influenza B by PCR: NEGATIVE
SARS Coronavirus 2 by RT PCR: NEGATIVE

## 2021-06-14 LAB — CBC WITH DIFFERENTIAL/PLATELET
Abs Immature Granulocytes: 0.03 10*3/uL (ref 0.00–0.07)
Basophils Absolute: 0.1 10*3/uL (ref 0.0–0.1)
Basophils Relative: 1 %
Eosinophils Absolute: 0 10*3/uL (ref 0.0–0.5)
Eosinophils Relative: 0 %
HCT: 39.9 % (ref 39.0–52.0)
Hemoglobin: 13.7 g/dL (ref 13.0–17.0)
Immature Granulocytes: 0 %
Lymphocytes Relative: 11 %
Lymphs Abs: 1.2 10*3/uL (ref 0.7–4.0)
MCH: 34.9 pg — ABNORMAL HIGH (ref 26.0–34.0)
MCHC: 34.3 g/dL (ref 30.0–36.0)
MCV: 101.8 fL — ABNORMAL HIGH (ref 80.0–100.0)
Monocytes Absolute: 0.5 10*3/uL (ref 0.1–1.0)
Monocytes Relative: 5 %
Neutro Abs: 9.1 10*3/uL — ABNORMAL HIGH (ref 1.7–7.7)
Neutrophils Relative %: 83 %
Platelets: 211 10*3/uL (ref 150–400)
RBC: 3.92 MIL/uL — ABNORMAL LOW (ref 4.22–5.81)
RDW: 13 % (ref 11.5–15.5)
WBC: 11 10*3/uL — ABNORMAL HIGH (ref 4.0–10.5)
nRBC: 0 % (ref 0.0–0.2)

## 2021-06-14 LAB — MRSA NEXT GEN BY PCR, NASAL: MRSA by PCR Next Gen: NOT DETECTED

## 2021-06-14 LAB — TROPONIN I (HIGH SENSITIVITY)
Troponin I (High Sensitivity): 11 ng/L (ref <18)
Troponin I (High Sensitivity): 12 ng/L (ref <18)

## 2021-06-14 LAB — LACTIC ACID, PLASMA: Lactic Acid, Venous: 2 mmol/L (ref 0.5–1.9)

## 2021-06-14 LAB — BRAIN NATRIURETIC PEPTIDE: B Natriuretic Peptide: 2838 pg/mL — ABNORMAL HIGH (ref 0.0–100.0)

## 2021-06-14 LAB — PROCALCITONIN: Procalcitonin: <0.1 ng/mL

## 2021-06-14 MED ORDER — ASPIRIN-ACETAMINOPHEN-CAFFEINE 520-260-32.5 MG PO PACK: 1.0000 | PACK | Freq: Every day | ORAL | Status: DC | PRN

## 2021-06-14 MED ORDER — ALBUTEROL SULFATE (2.5 MG/3ML) 0.083% IN NEBU: 2.5000 mg | INHALATION_SOLUTION | RESPIRATORY_TRACT | Status: DC | PRN

## 2021-06-14 MED ORDER — ADULT MULTIVITAMIN W/MINERALS CH
1.0000 | ORAL_TABLET | Freq: Every day | ORAL | Status: DC
  Administered 2021-06-14 – 2021-06-19 (×6): 1 via ORAL
  Filled 2021-06-14 (×6): qty 1

## 2021-06-14 MED ORDER — POTASSIUM CHLORIDE CRYS ER 20 MEQ PO TBCR
40.0000 meq | EXTENDED_RELEASE_TABLET | Freq: Two times a day (BID) | ORAL | Status: DC
  Administered 2021-06-14 – 2021-06-19 (×9): 40 meq via ORAL
  Filled 2021-06-14 (×10): qty 2

## 2021-06-14 MED ORDER — THIAMINE HCL 100 MG/ML IJ SOLN
100.0000 mg | Freq: Every day | INTRAMUSCULAR | Status: DC
  Filled 2021-06-14: qty 2

## 2021-06-14 MED ORDER — ONDANSETRON HCL 4 MG/2ML IJ SOLN: 4.0000 mg | Freq: Four times a day (QID) | INTRAMUSCULAR | Status: DC | PRN

## 2021-06-14 MED ORDER — ONDANSETRON HCL 4 MG PO TABS: 4.0000 mg | ORAL_TABLET | Freq: Four times a day (QID) | ORAL | Status: DC | PRN

## 2021-06-14 MED ORDER — INSULIN ASPART 100 UNIT/ML IJ SOLN
0.0000 [IU] | Freq: Three times a day (TID) | INTRAMUSCULAR | Status: DC
  Administered 2021-06-15: 2 [IU] via SUBCUTANEOUS
  Administered 2021-06-15 – 2021-06-16 (×4): 3 [IU] via SUBCUTANEOUS
  Administered 2021-06-16 – 2021-06-17 (×2): 2 [IU] via SUBCUTANEOUS
  Administered 2021-06-18 (×3): 3 [IU] via SUBCUTANEOUS

## 2021-06-14 MED ORDER — METHYLPREDNISOLONE SODIUM SUCC 125 MG IJ SOLR
125.0000 mg | Freq: Once | INTRAMUSCULAR | Status: AC
  Administered 2021-06-14: 125 mg via INTRAVENOUS
  Filled 2021-06-14: qty 2

## 2021-06-14 MED ORDER — ALBUTEROL SULFATE (2.5 MG/3ML) 0.083% IN NEBU
INHALATION_SOLUTION | RESPIRATORY_TRACT | Status: AC
  Administered 2021-06-14: 10 mg
  Filled 2021-06-14: qty 12

## 2021-06-14 MED ORDER — FLUTICASONE FUROATE-VILANTEROL 100-25 MCG/ACT IN AEPB: 1.0000 | INHALATION_SPRAY | Freq: Every day | RESPIRATORY_TRACT | Status: DC

## 2021-06-14 MED ORDER — FUROSEMIDE 10 MG/ML IJ SOLN
40.0000 mg | Freq: Once | INTRAMUSCULAR | Status: AC
  Administered 2021-06-14: 40 mg via INTRAVENOUS
  Filled 2021-06-14: qty 4

## 2021-06-14 MED ORDER — IPRATROPIUM-ALBUTEROL 0.5-2.5 (3) MG/3ML IN SOLN
3.0000 mL | Freq: Four times a day (QID) | RESPIRATORY_TRACT | Status: DC
  Administered 2021-06-14: 3 mL via RESPIRATORY_TRACT
  Filled 2021-06-14: qty 3

## 2021-06-14 MED ORDER — INSULIN ASPART 100 UNIT/ML IJ SOLN
0.0000 [IU] | Freq: Every day | INTRAMUSCULAR | Status: DC
  Administered 2021-06-14: 2 [IU] via SUBCUTANEOUS

## 2021-06-14 MED ORDER — LORAZEPAM 1 MG PO TABS
1.0000 mg | ORAL_TABLET | ORAL | Status: DC | PRN
  Administered 2021-06-14: 3 mg via ORAL
  Administered 2021-06-14 – 2021-06-16 (×9): 2 mg via ORAL
  Filled 2021-06-14 (×3): qty 2
  Filled 2021-06-14: qty 3
  Filled 2021-06-14 (×6): qty 2

## 2021-06-14 MED ORDER — FLUTICASONE FUROATE-VILANTEROL 100-25 MCG/ACT IN AEPB
1.0000 | INHALATION_SPRAY | Freq: Every day | RESPIRATORY_TRACT | Status: DC
  Administered 2021-06-15 – 2021-06-19 (×4): 1 via RESPIRATORY_TRACT
  Filled 2021-06-14: qty 28

## 2021-06-14 MED ORDER — FOLIC ACID 1 MG PO TABS
1.0000 mg | ORAL_TABLET | Freq: Every day | ORAL | Status: DC
  Administered 2021-06-14 – 2021-06-19 (×6): 1 mg via ORAL
  Filled 2021-06-14 (×6): qty 1

## 2021-06-14 MED ORDER — FUROSEMIDE 10 MG/ML IJ SOLN
40.0000 mg | Freq: Two times a day (BID) | INTRAMUSCULAR | Status: DC
  Administered 2021-06-14 – 2021-06-16 (×4): 40 mg via INTRAVENOUS
  Filled 2021-06-14 (×4): qty 4

## 2021-06-14 MED ORDER — ASPIRIN-ACETAMINOPHEN-CAFFEINE 250-250-65 MG PO TABS
1.0000 | ORAL_TABLET | Freq: Every day | ORAL | Status: DC | PRN
  Administered 2021-06-18: 1 via ORAL
  Filled 2021-06-14 (×3): qty 1

## 2021-06-14 MED ORDER — LORAZEPAM 2 MG/ML IJ SOLN
1.0000 mg | INTRAMUSCULAR | Status: DC | PRN
  Administered 2021-06-15 – 2021-06-16 (×2): 2 mg via INTRAVENOUS
  Administered 2021-06-16 – 2021-06-17 (×6): 4 mg via INTRAVENOUS
  Filled 2021-06-14: qty 2
  Filled 2021-06-14 (×2): qty 1
  Filled 2021-06-14 (×2): qty 2
  Filled 2021-06-14: qty 1
  Filled 2021-06-14: qty 2
  Filled 2021-06-14: qty 1
  Filled 2021-06-14: qty 2

## 2021-06-14 MED ORDER — MAGNESIUM SULFATE 2 GM/50ML IV SOLN
2.0000 g | Freq: Once | INTRAVENOUS | Status: AC
  Administered 2021-06-14: 2 g via INTRAVENOUS
  Filled 2021-06-14: qty 50

## 2021-06-14 MED ORDER — THIAMINE HCL 100 MG PO TABS
100.0000 mg | ORAL_TABLET | Freq: Every day | ORAL | Status: DC
  Administered 2021-06-14 – 2021-06-19 (×6): 100 mg via ORAL
  Filled 2021-06-14 (×6): qty 1

## 2021-06-14 MED ORDER — ENOXAPARIN SODIUM 40 MG/0.4ML IJ SOSY
40.0000 mg | PREFILLED_SYRINGE | INTRAMUSCULAR | Status: DC
  Administered 2021-06-14 – 2021-06-16 (×3): 40 mg via SUBCUTANEOUS
  Filled 2021-06-14 (×4): qty 0.4

## 2021-06-14 MED ORDER — PREDNISONE 20 MG PO TABS
40.0000 mg | ORAL_TABLET | Freq: Every day | ORAL | Status: DC
  Filled 2021-06-14: qty 2

## 2021-06-14 MED ORDER — METHYLPREDNISOLONE SODIUM SUCC 125 MG IJ SOLR
60.0000 mg | Freq: Two times a day (BID) | INTRAMUSCULAR | Status: AC
  Administered 2021-06-14 – 2021-06-15 (×2): 60 mg via INTRAVENOUS
  Filled 2021-06-14 (×2): qty 2

## 2021-06-14 NOTE — Progress Notes (Signed)
Patient off BiPAP, on 2 liters oxygen

## 2021-06-14 NOTE — ED Notes (Signed)
Dr at bedside during triage
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
## 2021-06-14 NOTE — Progress Notes (Signed)
Patient transferred to [facility] from [place] off [address]. He is currently on 4lpm [place] and is comfortable and talking. Bipap left at bedside.

## 2021-06-14 NOTE — ED Notes (Signed)
Pt refused cultures per lab tech.

## 2021-06-14 NOTE — ED Notes (Signed)
Date and time results received: 06/14/21 1645  Test: Lactic acid  Critical Value: 2.0  Name of Provider Notified: DR. Nehemiah Settle  Orders Received? Or Actions Taken?: notified

## 2021-06-14 NOTE — Plan of Care (Signed)
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
## 2021-06-14 NOTE — ED Triage Notes (Signed)
Pt brought in by [facility] with c/o respiratory distress. EMS states pt was tripod breathing and placed on NRB and given neb. Wheezing to right side. 20 L wrist placed by EMS

## 2021-06-15 ENCOUNTER — Other Ambulatory Visit (HOSPITAL_COMMUNITY): Payer: Medicaid Other

## 2021-06-15 DIAGNOSIS — J9601 Acute respiratory failure with hypoxia: Secondary | ICD-10-CM | POA: Diagnosis not present

## 2021-06-15 LAB — RESPIRATORY PANEL BY PCR

## 2021-06-15 LAB — BASIC METABOLIC PANEL
Anion gap: 10 (ref 5–15)
BUN: 15 mg/dL (ref 6–20)
CO2: 26 mmol/L (ref 22–32)
Calcium: 9 mg/dL (ref 8.9–10.3)
Chloride: 97 mmol/L — ABNORMAL LOW (ref 98–111)
Creatinine, Ser: 0.72 mg/dL (ref 0.61–1.24)
GFR, Estimated: >60 mL/min (ref >60)
Glucose, Bld: 189 mg/dL — ABNORMAL HIGH (ref 70–99)
Potassium: 3.4 mmol/L — ABNORMAL LOW (ref 3.5–5.1)
Sodium: 133 mmol/L — ABNORMAL LOW (ref 135–145)

## 2021-06-15 LAB — STREP PNEUMONIAE URINARY ANTIGEN: Strep Pneumo Urinary Antigen: NEGATIVE

## 2021-06-15 LAB — GLUCOSE, CAPILLARY
Glucose-Capillary: 239 mg/dL — ABNORMAL HIGH (ref 70–99)
Glucose-Capillary: 145 mg/dL — ABNORMAL HIGH (ref 70–99)
Glucose-Capillary: 158 mg/dL — ABNORMAL HIGH (ref 70–99)
Glucose-Capillary: 191 mg/dL — ABNORMAL HIGH (ref 70–99)
Glucose-Capillary: 166 mg/dL — ABNORMAL HIGH (ref 70–99)

## 2021-06-15 LAB — CBC
HCT: 42.1 % (ref 39.0–52.0)
Hemoglobin: 14.9 g/dL (ref 13.0–17.0)
MCH: 35.4 pg — ABNORMAL HIGH (ref 26.0–34.0)
MCHC: 35.4 g/dL (ref 30.0–36.0)
MCV: 100 fL (ref 80.0–100.0)
Platelets: 207 10*3/uL (ref 150–400)
RBC: 4.21 MIL/uL — ABNORMAL LOW (ref 4.22–5.81)
RDW: 12.9 % (ref 11.5–15.5)
WBC: 10.5 10*3/uL (ref 4.0–10.5)
nRBC: 0 % (ref 0.0–0.2)

## 2021-06-15 LAB — HEMOGLOBIN A1C
Hgb A1c MFr Bld: 5.1 % (ref 4.8–5.6)
Mean Plasma Glucose: 99.67 mg/dL

## 2021-06-15 LAB — HIV ANTIBODY (ROUTINE TESTING W REFLEX): HIV Screen 4th Generation wRfx: NONREACTIVE

## 2021-06-15 LAB — PROCALCITONIN: Procalcitonin: <0.1 ng/mL

## 2021-06-15 MED ORDER — BISOPROLOL-HYDROCHLOROTHIAZIDE 2.5-6.25 MG PO TABS: 1.0000 | ORAL_TABLET | Freq: Every day | ORAL | Status: DC

## 2021-06-15 MED ORDER — DM-GUAIFENESIN ER 30-600 MG PO TB12
1.0000 | ORAL_TABLET | Freq: Two times a day (BID) | ORAL | Status: DC
  Administered 2021-06-15 – 2021-06-19 (×8): 1 via ORAL
  Filled 2021-06-15 (×8): qty 1

## 2021-06-15 MED ORDER — ATORVASTATIN CALCIUM 40 MG PO TABS
40.0000 mg | ORAL_TABLET | Freq: Every day | ORAL | Status: DC
  Administered 2021-06-15 – 2021-06-19 (×5): 40 mg via ORAL
  Filled 2021-06-15 (×5): qty 1

## 2021-06-15 MED ORDER — BISOPROLOL-HYDROCHLOROTHIAZIDE 2.5-6.25 MG PO TABS
1.0000 | ORAL_TABLET | Freq: Every day | ORAL | Status: DC
  Administered 2021-06-18: 1 via ORAL
  Filled 2021-06-15 (×5): qty 1

## 2021-06-15 MED ORDER — CHLORDIAZEPOXIDE HCL 5 MG PO CAPS
10.0000 mg | ORAL_CAPSULE | Freq: Three times a day (TID) | ORAL | Status: DC
  Administered 2021-06-15 – 2021-06-16 (×5): 10 mg via ORAL
  Filled 2021-06-15 (×5): qty 2

## 2021-06-15 MED ORDER — IPRATROPIUM-ALBUTEROL 0.5-2.5 (3) MG/3ML IN SOLN
3.0000 mL | Freq: Four times a day (QID) | RESPIRATORY_TRACT | Status: DC
  Administered 2021-06-15 – 2021-06-17 (×7): 3 mL via RESPIRATORY_TRACT
  Filled 2021-06-15 (×7): qty 3

## 2021-06-15 MED ORDER — DOXYCYCLINE HYCLATE 100 MG PO TABS
100.0000 mg | ORAL_TABLET | Freq: Two times a day (BID) | ORAL | Status: DC
  Administered 2021-06-15 – 2021-06-19 (×8): 100 mg via ORAL
  Filled 2021-06-15 (×9): qty 1

## 2021-06-15 MED ORDER — ASPIRIN EC 81 MG PO TBEC
81.0000 mg | DELAYED_RELEASE_TABLET | Freq: Every day | ORAL | Status: DC
  Administered 2021-06-15 – 2021-06-19 (×5): 81 mg via ORAL
  Filled 2021-06-15 (×5): qty 1

## 2021-06-15 MED ORDER — CHLORHEXIDINE GLUCONATE CLOTH 2 % EX PADS
6.0000 | MEDICATED_PAD | Freq: Every day | CUTANEOUS | Status: DC
  Administered 2021-06-15: 6 via TOPICAL

## 2021-06-15 MED ORDER — METHYLPREDNISOLONE SODIUM SUCC 40 MG IJ SOLR
40.0000 mg | Freq: Two times a day (BID) | INTRAMUSCULAR | Status: DC
  Administered 2021-06-15 – 2021-06-18 (×6): 40 mg via INTRAVENOUS
  Filled 2021-06-15 (×6): qty 1

## 2021-06-15 MED ORDER — NICOTINE 21 MG/24HR TD PT24
21.0000 mg | MEDICATED_PATCH | Freq: Every day | TRANSDERMAL | Status: DC
  Administered 2021-06-17 – 2021-06-18 (×2): 21 mg via TRANSDERMAL
  Filled 2021-06-15 (×4): qty 1

## 2021-06-15 NOTE — Progress Notes (Signed)
PROGRESS NOTE     Francisco Robinson, is a 60 y.o. male, DOB - Aug 05, 1961, ELF:[id number]  Admit date - 06/14/2021   Admitting Physician Truett Mainland, DO  Outpatient Primary MD for the patient is Rosita Fire, MD  LOS - 1  Chief Complaint  Patient presents with   Respiratory Distress        Brief Narrative:  60 y.o. male with a history of COPD, history of MI, history of alcohol use, diabetes, history of seizure disorder from alcohol admitted on 06/14/2021 with acute hypoxic respiratory failure secondary to combination of COPD exacerbation and CHF   Assessment & Plan:   Principal Problem:   Acute respiratory failure with hypoxia ([place]) Active Problems:   COPD (chronic obstructive pulmonary disease) ([place])   History of stroke   Seizure ([place])   History of MI (myocardial infarction)   History of ETOH abuse   Diabetes mellitus without complication ([place])   1)Acute COPD Exacerbation/Tobacco abuse--- -Cough, shortness of breath and wheezing persist -No further fevers -able to come off BiPAP  -Continue bronchodilators, mucolytics, IV Solu-Medrol and supplemental oxygen, as well as doxycycline -Nicotine patch as ordered  2) acute CHF----??? systolic versus diastolic- -Echo pending to determine EF -Continue IV Lasix, strict input/output and daily weight  3) acute hypoxic respiratory failure secondary to #1 and #2 above -Patient was able to come off BiPAP -Management as above #1 and #2  4)ETOH Abuse--- heavy EtOH use, high risk for DTs -At baseline patient drinks up to 15 beers per day -Lorazepam per CIWA protocol, folic acid and thiamine as ordered -Scheduled Librium  5)H/o CVA and CAD--- aspirin and Lipitor for secondary stroke prevention --bisoprolol/HCTZ - Hold for systolic BP less than 585 mmhg or heart rate less than 60   6)DM2-recent A1c 5.1 reflecting excellent DM control PTA Use Novolog/Humalog Sliding scale insulin with Accu-Cheks/Fingersticks as ordered    Disposition/Need for in-Hospital Stay- patient unable to be discharged at this time due to acute COPD and CHF exacerbation requiring IV Solu-Medrol, IV Lasix and supplemental oxygen --Anticipate discharge home in couple days if respiratory status improved especially if hypoxia resolves  Status is: Inpatient  Remains inpatient appropriate because: Please see disposition above  Disposition: The patient is from: Home              Anticipated d/c is to: Home              Anticipated d/c date is: 2 days              Patient currently is not medically stable to d/c. Barriers: Not Clinically Stable-   Code Status : -  Code Status: Full Code   Family Communication:    NA (patient is alert, awake and coherent)   Consults  :  na  DVT Prophylaxis  :   - SCDs   enoxaparin (LOVENOX) injection 40 mg Start: 06/14/21 1800  Lab Results  Component Value Date   PLT 207 06/15/2021    Inpatient Medications  Scheduled Meds:  aspirin EC  81 mg Oral Q breakfast   atorvastatin  40 mg Oral Daily   [START ON 06/16/2021] bisoprolol-hydrochlorothiazide  1 tablet Oral Daily   chlordiazePOXIDE  10 mg Oral TID   Chlorhexidine Gluconate Cloth  6 each Topical Daily   dextromethorphan-guaiFENesin  1 tablet Oral BID   doxycycline  100 mg Oral Q12H   enoxaparin (LOVENOX) injection  40 mg Subcutaneous Q24H   fluticasone furoate-vilanterol  1 puff Inhalation Daily  folic acid  1 mg Oral Daily   furosemide  40 mg Intravenous BID   insulin aspart  0-15 Units Subcutaneous TID WC   insulin aspart  0-5 Units Subcutaneous QHS   ipratropium-albuterol  3 mL Nebulization Q6H WA   methylPREDNISolone (SOLU-MEDROL) injection  40 mg Intravenous Q12H   multivitamin with minerals  1 tablet Oral Daily   nicotine  21 mg Transdermal Daily   potassium chloride  40 mEq Oral BID   thiamine  100 mg Oral Daily   Or   thiamine  100 mg Intravenous Daily   Continuous Infusions: PRN Meds:.albuterol,  aspirin-acetaminophen-caffeine, LORazepam **OR** LORazepam, ondansetron **OR** ondansetron (ZOFRAN) IV   Anti-infectives (From admission, onward)    None         Subjective: Francisco Robinson today has no further fevers, no emesis,  No chest pain,   -Shortness of breath, cough, wheezing and hypoxia persist  Objective: Vitals:   06/15/21 1000 06/15/21 1100 06/15/21 1200 06/15/21 1403  BP:  111/74 106/76   Pulse: 100 (!) 102 (!) 102   Resp: (!) 25 19 20    Temp:      TempSrc:      SpO2: 94% 94% 96% 100%  Weight:      Height:        Intake/Output Summary (Last 24 hours) at 06/15/2021 1556 Last data filed at 06/15/2021 1200 Gross per 24 hour  Intake 720 ml  Output 4700 ml  Net -3980 ml   Filed Weights   06/14/21 1250 06/14/21 1656 06/15/21 0448  Weight: 55.2 kg 49.7 kg 48.9 kg    Physical Exam  Gen:- Awake Alert,  in no apparent distress  HEENT:- [place].AT, No sclera icterus Nose- [place] 2L/min Neck-Supple Neck,No JVD,.  Lungs-diminished breath sounds, scattered wheezes bilaterally CV- S1, S2 normal, regular  Abd-  +ve B.Sounds, Abd Soft, No tenderness,    Extremity/Skin:- No  edema, pedal pulses present  Psych-affect is appropriate, oriented x3 Neuro-no new focal deficits, no tremors  Data Reviewed: I have personally reviewed following labs and imaging studies  CBC: Recent Labs  Lab 06/14/21 1249 06/15/21 0145  WBC 11.0* 10.5  NEUTROABS 9.1*  --   HGB 13.7 14.9  HCT 39.9 42.1  MCV 101.8* 100.0  PLT 211 517   Basic Metabolic Panel: Recent Labs  Lab 06/14/21 1249 06/15/21 0145  NA 133* 133*  K 3.8 3.4*  CL 100 97*  CO2 23 26  GLUCOSE 152* 189*  BUN 5* 15  CREATININE 0.62 0.72  CALCIUM 8.4* 9.0   GFR: Estimated Creatinine Clearance: 68.8 mL/min (by C-G formula based on SCr of 0.72 mg/dL). Liver Function Tests: Recent Labs  Lab 06/14/21 1249  AST 153*  ALT 58*  ALKPHOS 156*  BILITOT 0.7  PROT 7.5  ALBUMIN 3.5   No results for input(s):  LIPASE, AMYLASE in the last 168 hours. No results for input(s): AMMONIA in the last 168 hours. Coagulation Profile: No results for input(s): INR, PROTIME in the last 168 hours. Cardiac Enzymes: No results for input(s): CKTOTAL, CKMB, CKMBINDEX, TROPONINI in the last 168 hours. BNP (last 3 results) No results for input(s): PROBNP in the last 8760 hours. HbA1C: Recent Labs    06/14/21 1249  HGBA1C 5.1   CBG: Recent Labs  Lab 06/14/21 2120 06/15/21 0812 06/15/21 1145  GLUCAP 239* 145* 158*   Lipid Profile: No results for input(s): CHOL, HDL, LDLCALC, TRIG, CHOLHDL, LDLDIRECT in the last 72 hours. Thyroid Function Tests: No results for  input(s): TSH, T4TOTAL, FREET4, T3FREE, THYROIDAB in the last 72 hours. Anemia Panel: No results for input(s): VITAMINB12, FOLATE, FERRITIN, TIBC, IRON, RETICCTPCT in the last 72 hours. Urine analysis:    Component Value Date/Time   COLORURINE COLORLESS (A) 03/21/2018 2135   APPEARANCEUR CLEAR 03/21/2018 2135   LABSPEC 1.001 (L) 03/21/2018 2135   PHURINE 6.0 03/21/2018 2135   GLUCOSEU NEGATIVE 03/21/2018 2135   HGBUR NEGATIVE 03/21/2018 2135   BILIRUBINUR NEGATIVE 03/21/2018 2135   KETONESUR NEGATIVE 03/21/2018 2135   PROTEINUR NEGATIVE 03/21/2018 2135   NITRITE NEGATIVE 03/21/2018 2135   LEUKOCYTESUR NEGATIVE 03/21/2018 2135   Sepsis Labs: @LABRCNTIP (procalcitonin:4,lacticidven:4)  ) Recent Results (from the past 240 hour(s))  Resp Panel by RT-PCR (Flu A&B, Covid) Nasopharyngeal Swab     Status: None   Collection Time: 06/14/21  3:28 PM   Specimen: Nasopharyngeal Swab; Nasopharyngeal(NP) swabs in vial transport medium  Result Value Ref Range Status   SARS Coronavirus 2 by RT PCR NEGATIVE NEGATIVE Final    Comment: (NOTE) SARS-CoV-2 target nucleic acids are NOT DETECTED.  The SARS-CoV-2 RNA is generally detectable in upper respiratory specimens during the acute phase of infection. The lowest concentration of SARS-CoV-2 viral copies  this assay can detect is 138 copies/mL. A negative result does not preclude SARS-Cov-2 infection and should not be used as the sole basis for treatment or other patient management decisions. A negative result may occur with  improper specimen collection/handling, submission of specimen other than nasopharyngeal swab, presence of viral mutation(s) within the areas targeted by this assay, and inadequate number of viral copies(<138 copies/mL). A negative result must be combined with clinical observations, patient history, and epidemiological information. The expected result is Negative.  Fact Sheet for Patients:  EntrepreneurPulse.com.au  Fact Sheet for Healthcare Providers:  IncredibleEmployment.be  This test is no t yet approved or cleared by the [place] FDA and  has been authorized for detection and/or diagnosis of SARS-CoV-2 by FDA under an Emergency Use Authorization (EUA). This EUA will remain  in effect (meaning this test can be used) for the duration of the COVID-19 declaration under Section 564(b)(1) of the Act, 21 U.S.C.section 360bbb-3(b)(1), unless the authorization is terminated  or revoked sooner.       Influenza A by PCR NEGATIVE NEGATIVE Final   Influenza B by PCR NEGATIVE NEGATIVE Final    Comment: (NOTE) The Xpert Xpress SARS-CoV-2/FLU/RSV plus assay is intended as an aid in the diagnosis of influenza from Nasopharyngeal swab specimens and should not be used as a sole basis for treatment. Nasal washings and aspirates are unacceptable for Xpert Xpress SARS-CoV-2/FLU/RSV testing.  Fact Sheet for Patients: EntrepreneurPulse.com.au  Fact Sheet for Healthcare Providers: IncredibleEmployment.be  This test is not yet approved or cleared by the [place] FDA and has been authorized for detection and/or diagnosis of SARS-CoV-2 by FDA under an Emergency Use Authorization (EUA). This EUA  will remain in effect (meaning this test can be used) for the duration of the COVID-19 declaration under Section 564(b)(1) of the Act, 21 U.S.C. section 360bbb-3(b)(1), unless the authorization is terminated or revoked.  Performed at [facility], [address] [place], [place] [address]   MRSA Next Gen by PCR, Nasal     Status: None   Collection Time: 06/14/21  5:13 PM   Specimen: Nasal Mucosa; Nasal Swab  Result Value Ref Range Status   MRSA by PCR Next Gen NOT DETECTED NOT DETECTED Final    Comment: (NOTE) The GeneXpert MRSA Assay (FDA approved  for NASAL specimens only), is one component of a comprehensive MRSA colonization surveillance program. It is not intended to diagnose MRSA infection nor to guide or monitor treatment for MRSA infections. Test performance is not FDA approved in patients less than 33 years old. Performed at [facility], [address], [place], [place] [address]   Expectorated Sputum Assessment w Gram Stain, Rflx to Resp Cult     Status: None (Preliminary result)   Collection Time: 06/14/21  6:40 PM   Specimen: Sputum  Result Value Ref Range Status   Specimen Description SPUTUM  Final   Special Requests NONE  Final   Sputum evaluation   Final    THIS SPECIMEN IS ACCEPTABLE FOR SPUTUM CULTURE Performed at [facility], [address] [place], [place] [address]    Report Status PENDING  Incomplete  Respiratory (~20 pathogens) panel by PCR     Status: None   Collection Time: 06/14/21  8:20 PM   Specimen: Expectorated Sputum; Respiratory  Result Value Ref Range Status   Adenovirus NOT DETECTED NOT DETECTED Final   Coronavirus 229E NOT DETECTED NOT DETECTED Final    Comment: (NOTE) The Coronavirus on the Respiratory Panel, DOES NOT test for the novel  Coronavirus (2019 nCoV)    Coronavirus HKU1 NOT DETECTED NOT DETECTED Final   Coronavirus NL63 NOT DETECTED NOT DETECTED Final   Coronavirus OC43 NOT DETECTED NOT DETECTED Final   Metapneumovirus NOT  DETECTED NOT DETECTED Final   Rhinovirus / Enterovirus NOT DETECTED NOT DETECTED Final   Influenza A NOT DETECTED NOT DETECTED Final   Influenza B NOT DETECTED NOT DETECTED Final   Parainfluenza Virus 1 NOT DETECTED NOT DETECTED Final   Parainfluenza Virus 2 NOT DETECTED NOT DETECTED Final   Parainfluenza Virus 3 NOT DETECTED NOT DETECTED Final   Parainfluenza Virus 4 NOT DETECTED NOT DETECTED Final   Respiratory Syncytial Virus NOT DETECTED NOT DETECTED Final   Bordetella pertussis NOT DETECTED NOT DETECTED Final   Bordetella Parapertussis NOT DETECTED NOT DETECTED Final   Chlamydophila pneumoniae NOT DETECTED NOT DETECTED Final   Mycoplasma pneumoniae NOT DETECTED NOT DETECTED Final    Comment: Performed at [facility], [place]. [address], [place],  [address]      Radiology Studies: DG Chest [place] 1 View  Result Date: 06/14/2021 CLINICAL DATA:  60 year old male with shortness of breath EXAM: PORTABLE CHEST 1 VIEW COMPARISON:  01/27/2016 FINDINGS: Cardiomediastinal silhouette unchanged in size and contour. Interval development of reticulonodular opacities, asymmetrically of the right greater than left lungs with interlobular septal thickening. Emphysematous changes again noted. No pneumothorax or pleural effusion. No displaced fracture. IMPRESSION: New asymmetric reticulonodular opacities of the right greater than left lungs with interlobular septal thickening. Atypical infection is favored, although early asymmetric edema could have this appearance. Electronically Signed   By: Corrie Mckusick D.O.   On: 06/14/2021 14:02     Scheduled Meds:  chlordiazePOXIDE  10 mg Oral TID   Chlorhexidine Gluconate Cloth  6 each Topical Daily   enoxaparin (LOVENOX) injection  40 mg Subcutaneous Q24H   fluticasone furoate-vilanterol  1 puff Inhalation Daily   folic acid  1 mg Oral Daily   furosemide  40 mg Intravenous BID   insulin aspart  0-15 Units Subcutaneous TID WC   insulin aspart  0-5  Units Subcutaneous QHS   ipratropium-albuterol  3 mL Nebulization Q6H WA   multivitamin with minerals  1 tablet Oral Daily   potassium chloride  40 mEq Oral  BID   [START ON 06/16/2021] predniSONE  40 mg Oral Q breakfast   thiamine  100 mg Oral Daily   Or   thiamine  100 mg Intravenous Daily   Continuous Infusions:   LOS: 1 day    Roxan Hockey M.D on 06/15/2021 at 3:56 PM  Go to [email or web address] - for contact info  [facility] - Office  [phone]  If 7PM-7AM, please contact night-coverage [email or web address] Password [facility] 06/15/2021, 3:56 PM

## 2021-06-16 ENCOUNTER — Inpatient Hospital Stay (HOSPITAL_COMMUNITY): Payer: Medicaid Other

## 2021-06-16 DIAGNOSIS — J9601 Acute respiratory failure with hypoxia: Secondary | ICD-10-CM | POA: Diagnosis not present

## 2021-06-16 DIAGNOSIS — I5030 Unspecified diastolic (congestive) heart failure: Secondary | ICD-10-CM

## 2021-06-16 LAB — CBC
HCT: 38.8 % — ABNORMAL LOW (ref 39.0–52.0)
Hemoglobin: 13.2 g/dL (ref 13.0–17.0)
MCH: 34.4 pg — ABNORMAL HIGH (ref 26.0–34.0)
MCHC: 34 g/dL (ref 30.0–36.0)
MCV: 101 fL — ABNORMAL HIGH (ref 80.0–100.0)
Platelets: 193 10*3/uL (ref 150–400)
RBC: 3.84 MIL/uL — ABNORMAL LOW (ref 4.22–5.81)
RDW: 12.8 % (ref 11.5–15.5)
WBC: 17.4 10*3/uL — ABNORMAL HIGH (ref 4.0–10.5)
nRBC: 0 % (ref 0.0–0.2)

## 2021-06-16 LAB — ECHOCARDIOGRAM COMPLETE
AR max vel: 1.57 cm2
AV Area VTI: 1.55 cm2
AV Area mean vel: 1.68 cm2
AV Mean grad: 11 mmHg
AV Peak grad: 19.8 mmHg
Ao pk vel: 2.23 m/s
Area-P 1/2: 5.62 cm2
Calc EF: 37.9 %
Height: 66 in
MV VTI: 3.12 cm2
S' Lateral: 4.3 cm
Single Plane A2C EF: 44.7 %
Single Plane A4C EF: 25.6 %
Weight: 1816.59 oz

## 2021-06-16 LAB — GLUCOSE, CAPILLARY
Glucose-Capillary: 158 mg/dL — ABNORMAL HIGH (ref 70–99)
Glucose-Capillary: 141 mg/dL — ABNORMAL HIGH (ref 70–99)
Glucose-Capillary: 171 mg/dL — ABNORMAL HIGH (ref 70–99)
Glucose-Capillary: 154 mg/dL — ABNORMAL HIGH (ref 70–99)

## 2021-06-16 LAB — BASIC METABOLIC PANEL
Anion gap: 8 (ref 5–15)
BUN: 23 mg/dL — ABNORMAL HIGH (ref 6–20)
CO2: 25 mmol/L (ref 22–32)
Calcium: 8.6 mg/dL — ABNORMAL LOW (ref 8.9–10.3)
Chloride: 96 mmol/L — ABNORMAL LOW (ref 98–111)
Creatinine, Ser: 0.82 mg/dL (ref 0.61–1.24)
GFR, Estimated: >60 mL/min (ref >60)
Glucose, Bld: 161 mg/dL — ABNORMAL HIGH (ref 70–99)
Potassium: 4 mmol/L (ref 3.5–5.1)
Sodium: 129 mmol/L — ABNORMAL LOW (ref 135–145)

## 2021-06-16 LAB — PROCALCITONIN: Procalcitonin: <0.1 ng/mL

## 2021-06-16 MED ORDER — FUROSEMIDE 10 MG/ML IJ SOLN
40.0000 mg | Freq: Every day | INTRAMUSCULAR | Status: DC
  Administered 2021-06-17: 40 mg via INTRAVENOUS
  Filled 2021-06-16: qty 4

## 2021-06-16 NOTE — Progress Notes (Addendum)
PROGRESS NOTE     Francisco Robinson, is a 60 y.o. male, DOB - 29-Jun-1961, HWE:[id number]  Admit date - 06/14/2021   Admitting Physician No admitting provider for patient encounter.  Outpatient Primary MD for the patient is Francisco Fire, MD  LOS - 2  Chief Complaint  Patient presents with   Respiratory Distress        Brief Narrative:  60 y.o. male with a history of COPD, history of MI, history of alcohol use, diabetes, history of seizure disorder from alcohol admitted on 06/14/2021 with acute hypoxic respiratory failure secondary to combination of COPD exacerbation and CHF   Assessment & Plan:   Principal Problem:   Acute respiratory failure with hypoxia ([place]) Active Problems:   COPD (chronic obstructive pulmonary disease) ([place])   History of stroke   Seizure ([place])   History of MI (myocardial infarction)   History of ETOH abuse   Diabetes mellitus without complication ([place])   1)Acute COPD Exacerbation/Tobacco abuse--- -Cough, shortness of breath and wheezing persist -No further fevers -able to come off BiPAP  -Continue bronchodilators, mucolytics, IV Solu-Medrol and supplemental oxygen, as well as doxycycline -Nicotine patch as ordered -Repeat chest x-ray from 06/16/2021 shows resolved opacities -Overall improving anticipate discharge on 06/17/2021 -  2)HFrEF acute CHF----combined systolic and diastolic- -Echo with EF of 30 to 35 % -diuresing well, chest x-ray as above #1 -Continue IV Lasix, strict input/output and daily weight  3) acute hypoxic respiratory failure secondary to #1 and #2 above -Patient was able to come off BiPAP -Weaning off oxygen anticipate discharge home without O2 on 06/17/2021 -Management as above #1 and #2  4)ETOH Abuse--- heavy EtOH use, high risk for DTs -At baseline patient drinks up to 15 beers per day -Somewhat anxious and restless, with increased work of breathing -Lorazepam per CIWA protocol, folic acid and thiamine as  ordered -Scheduled Librium  5)H/o CVA and CAD--- aspirin and Lipitor for secondary stroke prevention --bisoprolol/HCTZ - Hold for systolic BP less than 893 mmhg or heart rate less than 60   6)DM2-recent A1c 5.1 reflecting excellent DM control PTA Use Novolog/Humalog Sliding scale insulin with Accu-Cheks/Fingersticks as ordered   Disposition/Need for in-Hospital Stay- patient unable to be discharged at this time due to acute COPD and CHF exacerbation requiring IV Solu-Medrol, IV Lasix and supplemental oxygen -Patient is somewhat anxious, increased work of breathing noted --Weaning off oxygen anticipate discharge home without O2 on 06/17/2021  Status is: Inpatient  Remains inpatient appropriate because: Please see disposition above  Disposition: The patient is from: Home              Anticipated d/c is to: Home              Anticipated d/c date is: 1 day              Patient currently is not medically stable to d/c. Barriers: Not Clinically Stable-   Code Status : -  Code Status: Full Code   Family Communication:    NA (patient is alert, awake and coherent)   Consults  :  na  DVT Prophylaxis  :   - SCDs   enoxaparin (LOVENOX) injection 40 mg Start: 06/14/21 1800  Lab Results  Component Value Date   PLT 193 06/16/2021    Inpatient Medications  Scheduled Meds:  aspirin EC  81 mg Oral Q breakfast   atorvastatin  40 mg Oral Daily   bisoprolol-hydrochlorothiazide  1 tablet Oral Daily   chlordiazePOXIDE  10  mg Oral TID   Chlorhexidine Gluconate Cloth  6 each Topical Daily   dextromethorphan-guaiFENesin  1 tablet Oral BID   doxycycline  100 mg Oral Q12H   enoxaparin (LOVENOX) injection  40 mg Subcutaneous Q24H   fluticasone furoate-vilanterol  1 puff Inhalation Daily   folic acid  1 mg Oral Daily   [START ON 06/17/2021] furosemide  40 mg Intravenous Daily   insulin aspart  0-15 Units Subcutaneous TID WC   insulin aspart  0-5 Units Subcutaneous QHS   ipratropium-albuterol  3 mL  Nebulization Q6H WA   methylPREDNISolone (SOLU-MEDROL) injection  40 mg Intravenous Q12H   multivitamin with minerals  1 tablet Oral Daily   nicotine  21 mg Transdermal Daily   potassium chloride  40 mEq Oral BID   thiamine  100 mg Oral Daily   Or   thiamine  100 mg Intravenous Daily   Continuous Infusions: PRN Meds:.albuterol, aspirin-acetaminophen-caffeine, LORazepam **OR** LORazepam, ondansetron **OR** ondansetron (ZOFRAN) IV   Anti-infectives (From admission, onward)    Start     Dose/Rate Route Frequency Ordered Stop   06/15/21 1700  doxycycline (VIBRA-TABS) tablet 100 mg        100 mg Oral Every 12 hours 06/15/21 1647           Subjective: Francisco Robinson today has no further fevers, no emesis,  No chest pain,   -Shortness of breath, cough, wheezing and hypoxia persist  -Patient was requesting Percocets, became upset, anxious developed increased work of breathing while upset wheezing and dyspnea  Objective: Vitals:   06/16/21 0500 06/16/21 0802 06/16/21 0856 06/16/21 1109  BP:  90/68  91/66  Pulse:  98  97  Resp:  20  20  Temp:  (!) 97.5 F (36.4 C)  (!) 97.5 F (36.4 C)  TempSrc:  Oral  Oral  SpO2:  98% 98% 100%  Weight: 51.5 kg     Height: 5\' 6"  (1.676 m)       Intake/Output Summary (Last 24 hours) at 06/16/2021 1237 Last data filed at 06/16/2021 0400 Gross per 24 hour  Intake 870 ml  Output 600 ml  Net 270 ml   Filed Weights   06/14/21 1656 06/15/21 0448 06/16/21 0500  Weight: 49.7 kg 48.9 kg 51.5 kg    Physical Exam  Gen:- Awake Alert,  in no apparent distress, anxious with increased work of breathing HEENT:- [place].AT, No sclera icterus Nose- [place] 2L/min-weaning off oxygen Neck-Supple Neck,No JVD,.  Lungs-diminished breath sounds, scattered wheezes bilaterally CV- S1, S2 normal, regular  Abd-  +ve B.Sounds, Abd Soft, No tenderness,    Extremity/Skin:- No  edema, pedal pulses present  Psych-affect is anxious, oriented x3 Neuro-no new focal  deficits, no tremors  Data Reviewed: I have personally reviewed following labs and imaging studies  CBC: Recent Labs  Lab 06/14/21 1249 06/15/21 0145 06/16/21 0424  WBC 11.0* 10.5 17.4*  NEUTROABS 9.1*  --   --   HGB 13.7 14.9 13.2  HCT 39.9 42.1 38.8*  MCV 101.8* 100.0 101.0*  PLT 211 207 026   Basic Metabolic Panel: Recent Labs  Lab 06/14/21 1249 06/15/21 0145 06/16/21 0424  NA 133* 133* 129*  K 3.8 3.4* 4.0  CL 100 97* 96*  CO2 23 26 25   GLUCOSE 152* 189* 161*  BUN 5* 15 23*  CREATININE 0.62 0.72 0.82  CALCIUM 8.4* 9.0 8.6*   GFR: Estimated Creatinine Clearance: 70.7 mL/min (by C-G formula based on SCr of 0.82 mg/dL). Liver Function Tests:  Recent Labs  Lab 06/14/21 1249  AST 153*  ALT 58*  ALKPHOS 156*  BILITOT 0.7  PROT 7.5  ALBUMIN 3.5   No results for input(s): LIPASE, AMYLASE in the last 168 hours. No results for input(s): AMMONIA in the last 168 hours. Coagulation Profile: No results for input(s): INR, PROTIME in the last 168 hours. Cardiac Enzymes: No results for input(s): CKTOTAL, CKMB, CKMBINDEX, TROPONINI in the last 168 hours. BNP (last 3 results) No results for input(s): PROBNP in the last 8760 hours. HbA1C: Recent Labs    06/14/21 1249  HGBA1C 5.1   CBG: Recent Labs  Lab 06/15/21 1145 06/15/21 1632 06/15/21 2129 06/16/21 0803 06/16/21 1111  GLUCAP 158* 191* 166* 158* 141*   Lipid Profile: No results for input(s): CHOL, HDL, LDLCALC, TRIG, CHOLHDL, LDLDIRECT in the last 72 hours. Thyroid Function Tests: No results for input(s): TSH, T4TOTAL, FREET4, T3FREE, THYROIDAB in the last 72 hours. Anemia Panel: No results for input(s): VITAMINB12, FOLATE, FERRITIN, TIBC, IRON, RETICCTPCT in the last 72 hours. Urine analysis:    Component Value Date/Time   COLORURINE COLORLESS (A) 03/21/2018 2135   APPEARANCEUR CLEAR 03/21/2018 2135   LABSPEC 1.001 (L) 03/21/2018 2135   PHURINE 6.0 03/21/2018 2135   GLUCOSEU NEGATIVE 03/21/2018  2135   HGBUR NEGATIVE 03/21/2018 2135   BILIRUBINUR NEGATIVE 03/21/2018 2135   KETONESUR NEGATIVE 03/21/2018 2135   PROTEINUR NEGATIVE 03/21/2018 2135   NITRITE NEGATIVE 03/21/2018 2135   LEUKOCYTESUR NEGATIVE 03/21/2018 2135   Sepsis Labs: @LABRCNTIP (procalcitonin:4,lacticidven:4)  ) Recent Results (from the past 240 hour(s))  Resp Panel by RT-PCR (Flu A&B, Covid) Nasopharyngeal Swab     Status: None   Collection Time: 06/14/21  3:28 PM   Specimen: Nasopharyngeal Swab; Nasopharyngeal(NP) swabs in vial transport medium  Result Value Ref Range Status   SARS Coronavirus 2 by RT PCR NEGATIVE NEGATIVE Final    Comment: (NOTE) SARS-CoV-2 target nucleic acids are NOT DETECTED.  The SARS-CoV-2 RNA is generally detectable in upper respiratory specimens during the acute phase of infection. The lowest concentration of SARS-CoV-2 viral copies this assay can detect is 138 copies/mL. A negative result does not preclude SARS-Cov-2 infection and should not be used as the sole basis for treatment or other patient management decisions. A negative result may occur with  improper specimen collection/handling, submission of specimen other than nasopharyngeal swab, presence of viral mutation(s) within the areas targeted by this assay, and inadequate number of viral copies(<138 copies/mL). A negative result must be combined with clinical observations, patient history, and epidemiological information. The expected result is Negative.  Fact Sheet for Patients:  EntrepreneurPulse.com.au  Fact Sheet for Healthcare Providers:  IncredibleEmployment.be  This test is no t yet approved or cleared by the [place] FDA and  has been authorized for detection and/or diagnosis of SARS-CoV-2 by FDA under an Emergency Use Authorization (EUA). This EUA will remain  in effect (meaning this test can be used) for the duration of the COVID-19 declaration under Section  564(b)(1) of the Act, 21 U.S.C.section 360bbb-3(b)(1), unless the authorization is terminated  or revoked sooner.       Influenza A by PCR NEGATIVE NEGATIVE Final   Influenza B by PCR NEGATIVE NEGATIVE Final    Comment: (NOTE) The Xpert Xpress SARS-CoV-2/FLU/RSV plus assay is intended as an aid in the diagnosis of influenza from Nasopharyngeal swab specimens and should not be used as a sole basis for treatment. Nasal washings and aspirates are unacceptable for Xpert Xpress SARS-CoV-2/FLU/RSV testing.  Fact  Sheet for Patients: EntrepreneurPulse.com.au  Fact Sheet for Healthcare Providers: IncredibleEmployment.be  This test is not yet approved or cleared by the [place] FDA and has been authorized for detection and/or diagnosis of SARS-CoV-2 by FDA under an Emergency Use Authorization (EUA). This EUA will remain in effect (meaning this test can be used) for the duration of the COVID-19 declaration under Section 564(b)(1) of the Act, 21 U.S.C. section 360bbb-3(b)(1), unless the authorization is terminated or revoked.  Performed at [facility], [address] [place], [place] [address]   MRSA Next Gen by PCR, Nasal     Status: None   Collection Time: 06/14/21  5:13 PM   Specimen: Nasal Mucosa; Nasal Swab  Result Value Ref Range Status   MRSA by PCR Next Gen NOT DETECTED NOT DETECTED Final    Comment: (NOTE) The GeneXpert MRSA Assay (FDA approved for NASAL specimens only), is one component of a comprehensive MRSA colonization surveillance program. It is not intended to diagnose MRSA infection nor to guide or monitor treatment for MRSA infections. Test performance is not FDA approved in patients less than 40 years old. Performed at [facility], [address] [place], [place] [address]   Expectorated Sputum Assessment w Gram Stain, Rflx to Resp Cult     Status: None (Preliminary result)   Collection Time: 06/14/21  6:40 PM   Specimen:  Sputum  Result Value Ref Range Status   Specimen Description SPUTUM  Final   Special Requests NONE  Final   Sputum evaluation   Final    THIS SPECIMEN IS ACCEPTABLE FOR SPUTUM CULTURE Performed at [facility], [address], [place], [place] [address]    Report Status PENDING  Incomplete  Culture, Respiratory w Gram Stain     Status: None (Preliminary result)   Collection Time: 06/14/21  6:40 PM   Specimen: SPU  Result Value Ref Range Status   Specimen Description   Final    SPUTUM Performed at [facility], [address], [place], [place] [address]    Special Requests   Final    NONE Reflexed from [id number] Performed at [facility], [address] [place], [place] [address]    Gram Stain   Final    RARE SQUAMOUS EPITHELIAL CELLS PRESENT FEW WBC PRESENT,BOTH PMN AND MONONUCLEAR RARE GRAM POSITIVE COCCI    Culture   Final    CULTURE REINCUBATED FOR BETTER GROWTH Performed at [facility], [place] [address], [place], [place] [address]    Report Status PENDING  Incomplete  Respiratory (~20 pathogens) panel by PCR     Status: None   Collection Time: 06/14/21  8:20 PM   Specimen: Expectorated Sputum; Respiratory  Result Value Ref Range Status   Adenovirus NOT DETECTED NOT DETECTED Final   Coronavirus 229E NOT DETECTED NOT DETECTED Final    Comment: (NOTE) The Coronavirus on the Respiratory Panel, DOES NOT test for the novel  Coronavirus (2019 nCoV)    Coronavirus HKU1 NOT DETECTED NOT DETECTED Final   Coronavirus NL63 NOT DETECTED NOT DETECTED Final   Coronavirus OC43 NOT DETECTED NOT DETECTED Final   Metapneumovirus NOT DETECTED NOT DETECTED Final   Rhinovirus / Enterovirus NOT DETECTED NOT DETECTED Final   Influenza A NOT DETECTED NOT DETECTED Final   Influenza B NOT DETECTED NOT DETECTED Final   Parainfluenza Virus 1 NOT DETECTED NOT DETECTED Final   Parainfluenza Virus 2 NOT DETECTED NOT DETECTED Final   Parainfluenza Virus 3 NOT DETECTED NOT DETECTED Final  Parainfluenza Virus 4 NOT DETECTED NOT DETECTED Final   Respiratory Syncytial Virus NOT DETECTED NOT DETECTED Final   Bordetella pertussis NOT DETECTED NOT DETECTED Final   Bordetella Parapertussis NOT DETECTED NOT DETECTED Final   Chlamydophila pneumoniae NOT DETECTED NOT DETECTED Final   Mycoplasma pneumoniae NOT DETECTED NOT DETECTED Final    Comment: Performed at [facility], [place] [address] [place], [place] [address]  Culture, blood (Routine X 2) w Reflex to ID Panel     Status: None (Preliminary result)   Collection Time: 06/15/21  1:45 AM   Specimen: BLOOD RIGHT HAND  Result Value Ref Range Status   Specimen Description BLOOD RIGHT HAND  Final   Special Requests   Final    BOTTLES DRAWN AEROBIC AND ANAEROBIC Blood Culture adequate volume   Culture   Final    NO GROWTH 1 DAY Performed at [facility], [address] [place], [place] [address]    Report Status PENDING  Incomplete      Radiology Studies: DG Chest 2 View  Result Date: 06/16/2021 CLINICAL DATA:  Shortness of breath, cough EXAM: CHEST - 2 VIEW COMPARISON:  06/14/2021 FINDINGS: Transverse diameter of heart is slightly increased. Pulmonary vascularity is unremarkable. There is interval clearing of increased interstitial markings in both lungs, more so on the right side suggesting interval resolution of pneumonitis or asymmetric pulmonary edema. There are no new infiltrates. There is no pleural effusion or pneumothorax. IMPRESSION: Interval resolution of diffuse interstitial edema/interstitial pneumonia since 06/14/2021. There are no new infiltrates. There is no pleural effusion or pneumothorax. Electronically Signed   By: Elmer Picker M.D.   On: 06/16/2021 09:56   DG Chest Port 1 View  Result Date: 06/14/2021 CLINICAL DATA:  60 year old male with shortness of breath EXAM: PORTABLE CHEST 1 VIEW COMPARISON:  01/27/2016 FINDINGS: Cardiomediastinal silhouette unchanged in size and contour. Interval development  of reticulonodular opacities, asymmetrically of the right greater than left lungs with interlobular septal thickening. Emphysematous changes again noted. No pneumothorax or pleural effusion. No displaced fracture. IMPRESSION: New asymmetric reticulonodular opacities of the right greater than left lungs with interlobular septal thickening. Atypical infection is favored, although early asymmetric edema could have this appearance. Electronically Signed   By: Corrie Mckusick D.O.   On: 06/14/2021 14:02     Scheduled Meds:  aspirin EC  81 mg Oral Q breakfast   atorvastatin  40 mg Oral Daily   bisoprolol-hydrochlorothiazide  1 tablet Oral Daily   chlordiazePOXIDE  10 mg Oral TID   Chlorhexidine Gluconate Cloth  6 each Topical Daily   dextromethorphan-guaiFENesin  1 tablet Oral BID   doxycycline  100 mg Oral Q12H   enoxaparin (LOVENOX) injection  40 mg Subcutaneous Q24H   fluticasone furoate-vilanterol  1 puff Inhalation Daily   folic acid  1 mg Oral Daily   [START ON 06/17/2021] furosemide  40 mg Intravenous Daily   insulin aspart  0-15 Units Subcutaneous TID WC   insulin aspart  0-5 Units Subcutaneous QHS   ipratropium-albuterol  3 mL Nebulization Q6H WA   methylPREDNISolone (SOLU-MEDROL) injection  40 mg Intravenous Q12H   multivitamin with minerals  1 tablet Oral Daily   nicotine  21 mg Transdermal Daily   potassium chloride  40 mEq Oral BID   thiamine  100 mg Oral Daily   Or   thiamine  100 mg Intravenous Daily   Continuous Infusions:   LOS: 2 days    Roxan Hockey M.D on 06/16/2021 at  12:37 PM  Go to [email or web address] - for contact info  [facility] - Office  [phone]  If 7PM-7AM, please contact night-coverage [email or web address] Password [facility] 06/16/2021, 12:37 PM

## 2021-06-16 NOTE — TOC Initial Note (Signed)
Transition of Care [facility]) - Initial/Assessment Note    Patient Details  Name: Francisco Robinson MRN: [id number] Date of Birth: 1961/06/02  Transition of Care [facility]) CM/SW Contact:    Salome Arnt, [place] Phone Number: 06/16/2021, 1:07 PM  Clinical Narrative:  Pt admitted due to acute respiratory failure with hypoxia, secondary to COPD exacerbation and CHF. TOC consulted for CHF screening and substance abuse. Pt reports he lives alone and is independent with ADLs. He plans to return home when medically stable, but said will need a ride home at d/c.   CHF consult completed. Pt reports he does not weigh himself regularly and said his diet is "decent." LCSW educated pt on importance of daily weights and following heart healthy diet. He admits he doesn't take medications by choice. LCSW discussed importance of taking medications as prescribed.  Discussed substance abuse with pt. Although chart indicates he drinks 15 beers a day, pt states it is every 2-3 days. He denies alcohol use being a problem for him and refuses any resources. TOC will continue to follow.                  Expected Discharge Plan: Home/Self Care Barriers to Discharge: Continued Medical Work up   Patient Goals and CMS Choice Patient states their goals for this hospitalization and ongoing recovery are:: return home   Choice offered to / list presented to : Patient  Expected Discharge Plan and Services Expected Discharge Plan: Home/Self Care In-house Referral: Clinical Social Work     Living arrangements for the past 2 months: Single Family Home                 DME Arranged: N/A                    Prior Living Arrangements/Services Living arrangements for the past 2 months: Single Family Home Lives with:: Self Patient language and need for interpreter reviewed:: Yes Do you feel safe going back to the place where you live?: Yes      Need for Family Participation in Patient Care: No (Comment)      Criminal Activity/Legal Involvement Pertinent to Current Situation/Hospitalization: No - Comment as needed  Activities of Daily Living Home Assistive Devices/Equipment: None ADL Screening (condition at time of admission) Patient's cognitive ability adequate to safely complete daily activities?: Yes Is the patient deaf or have difficulty hearing?: No Does the patient have difficulty seeing, even when wearing glasses/contacts?: Yes Does the patient have difficulty concentrating, remembering, or making decisions?: No Patient able to express need for assistance with ADLs?: Yes Does the patient have difficulty dressing or bathing?: Yes Independently performs ADLs?: Yes (appropriate for developmental age) Does the patient have difficulty walking or climbing stairs?: No Weakness of Legs: None Weakness of Arms/Hands: None  Permission Sought/Granted                  Emotional Assessment       Orientation: : Oriented to Self, Oriented to Place, Oriented to  Time, Oriented to Situation Alcohol / Substance Use: Not Applicable Psych Involvement: No (comment)  Admission diagnosis:  Acute respiratory failure with hypoxia ([place]) [H88.50] Systolic congestive heart failure, unspecified HF chronicity ([facility]) [I50.20] Patient Active Problem List   Diagnosis Date Noted   Acute respiratory failure with hypoxia ([place]) 06/14/2021   History of stroke 06/14/2021   Seizure ([place]) 06/14/2021   History of MI (myocardial infarction) 06/14/2021   History of ETOH abuse 06/14/2021  Diabetes mellitus without complication ([place]) 48/32/3468   Hepatic cirrhosis ([place]) 06/11/2014   Constipation 06/11/2014   Alcohol abuse 07/15/2011   COPD (chronic obstructive pulmonary disease) ([place]) 07/15/2011   Chronic pain due to trauma 07/15/2011   PCP:  Rosita Fire, MD Pharmacy:   [facility], [place] [address] Phone: [phone] Fax:  [phone]     Social Determinants of Health (SDOH) Interventions    Readmission Risk Interventions No flowsheet data found.

## 2021-06-16 NOTE — Progress Notes (Signed)
*  PRELIMINARY RESULTS* Echocardiogram 2D Echocardiogram has been performed.  Francisco Robinson 06/16/2021, 12:25 PM

## 2021-06-17 DIAGNOSIS — F10931 Alcohol use, unspecified with withdrawal delirium: Secondary | ICD-10-CM | POA: Diagnosis not present

## 2021-06-17 LAB — GLUCOSE, CAPILLARY
Glucose-Capillary: 136 mg/dL — ABNORMAL HIGH (ref 70–99)
Glucose-Capillary: 120 mg/dL — ABNORMAL HIGH (ref 70–99)
Glucose-Capillary: 182 mg/dL — ABNORMAL HIGH (ref 70–99)
Glucose-Capillary: 200 mg/dL — ABNORMAL HIGH (ref 70–99)

## 2021-06-17 LAB — CULTURE, RESPIRATORY W GRAM STAIN: Culture: NORMAL

## 2021-06-17 LAB — LEGIONELLA PNEUMOPHILA SEROGP 1 UR AG: L. pneumophila Serogp 1 Ur Ag: NEGATIVE

## 2021-06-17 MED ORDER — FUROSEMIDE 10 MG/ML IJ SOLN
20.0000 mg | Freq: Every day | INTRAMUSCULAR | Status: DC
  Filled 2021-06-17: qty 2

## 2021-06-17 MED ORDER — LORAZEPAM 1 MG PO TABS
1.0000 mg | ORAL_TABLET | ORAL | Status: DC | PRN
  Administered 2021-06-17: 2 mg via ORAL
  Administered 2021-06-17 – 2021-06-18 (×4): 1 mg via ORAL
  Administered 2021-06-18 – 2021-06-19 (×3): 2 mg via ORAL
  Filled 2021-06-17: qty 1
  Filled 2021-06-17 (×3): qty 2
  Filled 2021-06-17: qty 1
  Filled 2021-06-17: qty 2
  Filled 2021-06-17 (×2): qty 1

## 2021-06-17 MED ORDER — LORAZEPAM 2 MG/ML IJ SOLN
1.0000 mg | INTRAMUSCULAR | Status: DC | PRN
  Filled 2021-06-17: qty 1

## 2021-06-17 MED ORDER — CHLORDIAZEPOXIDE HCL 5 MG PO CAPS
10.0000 mg | ORAL_CAPSULE | Freq: Four times a day (QID) | ORAL | Status: DC
  Administered 2021-06-17 – 2021-06-19 (×8): 10 mg via ORAL
  Filled 2021-06-17 (×9): qty 2

## 2021-06-17 MED ORDER — LOPERAMIDE HCL 2 MG PO CAPS: 2.0000 mg | ORAL_CAPSULE | ORAL | Status: DC | PRN

## 2021-06-17 MED ORDER — IPRATROPIUM-ALBUTEROL 0.5-2.5 (3) MG/3ML IN SOLN
3.0000 mL | Freq: Two times a day (BID) | RESPIRATORY_TRACT | Status: DC
  Administered 2021-06-19: 3 mL via RESPIRATORY_TRACT
  Filled 2021-06-17 (×4): qty 3

## 2021-06-17 MED ORDER — HYDROXYZINE HCL 25 MG PO TABS: 25.0000 mg | ORAL_TABLET | Freq: Four times a day (QID) | ORAL | Status: DC | PRN

## 2021-06-17 MED ORDER — CHLORDIAZEPOXIDE HCL 25 MG PO CAPS
25.0000 mg | ORAL_CAPSULE | Freq: Four times a day (QID) | ORAL | Status: DC | PRN
  Administered 2021-06-17: 25 mg via ORAL
  Filled 2021-06-17: qty 1

## 2021-06-17 MED ORDER — HALOPERIDOL LACTATE 5 MG/ML IJ SOLN
10.0000 mg | Freq: Once | INTRAMUSCULAR | Status: AC
  Administered 2021-06-17: 10 mg via INTRAMUSCULAR
  Filled 2021-06-17: qty 2

## 2021-06-17 NOTE — Progress Notes (Signed)
Pt slept most of afternoon. Awakens at intervals, initially confused with waking but reoriented. Pt continues to ask for, "40 ounce beer and the cheap red cigarettes." Pt has moments where he thinks he's at home, then he asks why we took his home from him. Intermittent cursing but not at staff, not attempting to leave, cooperative and redirectable for the most part. Ate well for supper, refused insulin and lovenox. VSS. MD Courage notified of refused meds.

## 2021-06-17 NOTE — Progress Notes (Signed)
Pt awake, agreeable to most of oral meds except potassium. Tolerated well. VSS. Eating ice cream at this time.

## 2021-06-17 NOTE — Progress Notes (Signed)
Pt remains soundly asleep. B/P 85 systolic, resp even and nonlabored, HR 90's and regular. Pt not alert enough to safely take oral meds at this time. MD Courage notified of VS, LOC, no new orders a this time.

## 2021-06-17 NOTE — Progress Notes (Addendum)
PROGRESS NOTE     Francisco Robinson, is a 60 y.o. male, DOB - April 06, 1961, UKG:[id number]  Admit date - 06/14/2021   Admitting Physician No admitting provider for patient encounter.  Outpatient Primary MD for the patient is Rosita Fire, MD  LOS - 3  Chief Complaint  Patient presents with   Respiratory Distress      Brief Narrative:  60 y.o. male with a history of COPD, history of MI, history of alcohol use, diabetes, history of seizure disorder from alcohol admitted on 06/14/2021 with acute hypoxic respiratory failure secondary to combination of COPD exacerbation and CHF --- Patient now has full-blown DTs   Assessment & Plan:   Principal Problem:   Delirium tremens/AlcoHol Withdrawal Active Problems:   Acute respiratory failure with hypoxia (HCC)   COPD (chronic obstructive pulmonary disease) ([place])   Diabetes mellitus without complication ([place])   History of stroke   Seizure ([place])   History of MI (myocardial infarction)   History of ETOH abuse   1)Acute COPD Exacerbation/Tobacco abuse--- -Cough, shortness of breath and wheezing persist -No further fevers -able to come off BiPAP  -Continue bronchodilators, mucolytics, IV Solu-Medrol and supplemental oxygen, as well as doxycycline -Nicotine patch as ordered -Repeat chest x-ray from 06/16/2021 shows resolved opacities -Much improved overall from a respiratory standpoint -  2) DTs/severe alcohol withdrawal----patient with significant tremors, agitation, restlessness, yelling and asking for [facility] -Patient NSAIDs at home -CIWA score this morning was 21 -Librium scheduled -Lorazepam patient was protocol -Folic acid and thiamine as ordered -Patient is heavy drinker he drinks up to 15 beers per day  3)HFrEF acute CHF----combined systolic and diastolic- -Echo with EF of 30 to 35 % -diuresing well, chest x-ray as above #1 -Continue IV Lasix, strict input/output and daily weight  4) acute hypoxic respiratory failure  secondary to #1 and #2 above -Patient was able to come off BiPAP -Currently weaned off oxygen -Management as above #1 and #2  5)H/o CVA and CAD--- aspirin and Lipitor for secondary stroke prevention --bisoprolol/HCTZ - Hold for systolic BP less than 762 mmhg or heart rate less than 60   6)DM2-recent A1c 5.1 reflecting excellent DM control PTA Use Novolog/Humalog Sliding scale insulin with Accu-Cheks/Fingersticks as ordered   7) hyponatremia----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 related, maintain adequate hydration  Disposition/Need for in-Hospital Stay- patient unable to be discharged at this time due to acute COPD and CHF exacerbation requiring IV Solu-Medrol, IV Lasix and supplemental oxygen and -Full-blown DTs requiring IV lorazepam and p.o. Librium -Anticipate discharge home over the next few days if DTs improve  Status is: Inpatient  Remains inpatient appropriate because: Please see disposition above  Disposition: The patient is from: Home              Anticipated d/c is to: Home              Anticipated d/c date is: 3 days              Patient currently is not medically stable to d/c. Barriers: Not Clinically Stable-   Code Status : -  Code Status: Full Code   Family Communication:    NA (patient is alert, awake and coherent)   Consults  :  na  DVT Prophylaxis  :   - SCDs   enoxaparin (LOVENOX) injection 40 mg Start: 06/14/21 1800  Lab Results  Component Value Date   PLT 193 06/16/2021    Inpatient Medications  Scheduled Meds:  aspirin EC  81 mg Oral Q  breakfast   atorvastatin  40 mg Oral Daily   bisoprolol-hydrochlorothiazide  1 tablet Oral Daily   chlordiazePOXIDE  10 mg Oral QID   dextromethorphan-guaiFENesin  1 tablet Oral BID   doxycycline  100 mg Oral Q12H   enoxaparin (LOVENOX) injection  40 mg Subcutaneous Q24H   fluticasone furoate-vilanterol  1 puff Inhalation Daily   folic acid  1 mg Oral Daily   furosemide  40 mg Intravenous Daily   insulin aspart  0-15 Units  Subcutaneous TID WC   insulin aspart  0-5 Units Subcutaneous QHS   ipratropium-albuterol  3 mL Nebulization BID   methylPREDNISolone (SOLU-MEDROL) injection  40 mg Intravenous Q12H   multivitamin with minerals  1 tablet Oral Daily   nicotine  21 mg Transdermal Daily   potassium chloride  40 mEq Oral BID   thiamine  100 mg Oral Daily   Or   thiamine  100 mg Intravenous Daily   Continuous Infusions: PRN Meds:.albuterol, aspirin-acetaminophen-caffeine, hydrOXYzine, loperamide, LORazepam **OR** LORazepam, ondansetron **OR** ondansetron (ZOFRAN) IV   Anti-infectives (From admission, onward)    Start     Dose/Rate Route Frequency Ordered Stop   06/15/21 1700  doxycycline (VIBRA-TABS) tablet 100 mg        100 mg Oral Every 12 hours 06/15/21 1647           Subjective: Francisco Robinson today has no further fevers, no emesis,  No chest pain,   -Full-blown DTs, tremors, confusion, agitation, restlessness  Objective: Vitals:   06/17/21 1038 06/17/21 1154 06/17/21 1613 06/17/21 1723  BP: (!) 85/64 99/67 97/76  (!) 116/96  Pulse: 91 93 (!) 101 95  Resp: 18 20 20 20   Temp:   98.1 F (36.7 C)   TempSrc:   Oral   SpO2: 100% 95% 98% 100%  Weight:      Height:        Intake/Output Summary (Last 24 hours) at 06/17/2021 1736 Last data filed at 06/17/2021 1705 Gross per 24 hour  Intake 1076 ml  Output 2875 ml  Net -1799 ml   Filed Weights   06/15/21 0448 06/16/21 0500 06/17/21 0508  Weight: 48.9 kg 51.5 kg 51.3 kg    Physical Exam  Gen:-Confused, restless, agitated  HEENT:- [place].AT, No sclera icterus Eye--left eye vision loss Neck-Supple Neck,No JVD,.  Lungs-improved air movement, no wheezing or rhonchi  CV- S1, S2 normal, regular  Abd-  +ve B.Sounds, Abd Soft, No tenderness,    Extremity/Skin:- No  edema, pedal pulses present  Psych-affect is anxious, restless, agitated, disoriented  neuro-no new focal deficits, significant tremors  Data Reviewed: I have personally reviewed  following labs and imaging studies  CBC: Recent Labs  Lab 06/14/21 1249 06/15/21 0145 06/16/21 0424  WBC 11.0* 10.5 17.4*  NEUTROABS 9.1*  --   --   HGB 13.7 14.9 13.2  HCT 39.9 42.1 38.8*  MCV 101.8* 100.0 101.0*  PLT 211 207 811   Basic Metabolic Panel: Recent Labs  Lab 06/14/21 1249 06/15/21 0145 06/16/21 0424  NA 133* 133* 129*  K 3.8 3.4* 4.0  CL 100 97* 96*  CO2 23 26 25   GLUCOSE 152* 189* 161*  BUN 5* 15 23*  CREATININE 0.62 0.72 0.82  CALCIUM 8.4* 9.0 8.6*   GFR: Estimated Creatinine Clearance: 70.4 mL/min (by C-G formula based on SCr of 0.82 mg/dL). Liver Function Tests: Recent Labs  Lab 06/14/21 1249  AST 153*  ALT 58*  ALKPHOS 156*  BILITOT 0.7  PROT 7.5  ALBUMIN  3.5   No results for input(s): LIPASE, AMYLASE in the last 168 hours. No results for input(s): AMMONIA in the last 168 hours. Coagulation Profile: No results for input(s): INR, PROTIME in the last 168 hours. Cardiac Enzymes: No results for input(s): CKTOTAL, CKMB, CKMBINDEX, TROPONINI in the last 168 hours. BNP (last 3 results) No results for input(s): PROBNP in the last 8760 hours. HbA1C: No results for input(s): HGBA1C in the last 72 hours.  CBG: Recent Labs  Lab 06/16/21 1520 06/16/21 2010 06/17/21 0721 06/17/21 1158 06/17/21 1648  GLUCAP 171* 154* 136* 120* 182*   Lipid Profile: No results for input(s): CHOL, HDL, LDLCALC, TRIG, CHOLHDL, LDLDIRECT in the last 72 hours. Thyroid Function Tests: No results for input(s): TSH, T4TOTAL, FREET4, T3FREE, THYROIDAB in the last 72 hours. Anemia Panel: No results for input(s): VITAMINB12, FOLATE, FERRITIN, TIBC, IRON, RETICCTPCT in the last 72 hours. Urine analysis:    Component Value Date/Time   COLORURINE COLORLESS (A) 03/21/2018 2135   APPEARANCEUR CLEAR 03/21/2018 2135   LABSPEC 1.001 (L) 03/21/2018 2135   PHURINE 6.0 03/21/2018 2135   GLUCOSEU NEGATIVE 03/21/2018 2135   HGBUR NEGATIVE 03/21/2018 2135   BILIRUBINUR  NEGATIVE 03/21/2018 2135   KETONESUR NEGATIVE 03/21/2018 2135   PROTEINUR NEGATIVE 03/21/2018 2135   NITRITE NEGATIVE 03/21/2018 2135   LEUKOCYTESUR NEGATIVE 03/21/2018 2135   Sepsis Labs: @LABRCNTIP (procalcitonin:4,lacticidven:4)  ) Recent Results (from the past 240 hour(s))  Resp Panel by RT-PCR (Flu A&B, Covid) Nasopharyngeal Swab     Status: None   Collection Time: 06/14/21  3:28 PM   Specimen: Nasopharyngeal Swab; Nasopharyngeal(NP) swabs in vial transport medium  Result Value Ref Range Status   SARS Coronavirus 2 by RT PCR NEGATIVE NEGATIVE Final    Comment: (NOTE) SARS-CoV-2 target nucleic acids are NOT DETECTED.  The SARS-CoV-2 RNA is generally detectable in upper respiratory specimens during the acute phase of infection. The lowest concentration of SARS-CoV-2 viral copies this assay can detect is 138 copies/mL. A negative result does not preclude SARS-Cov-2 infection and should not be used as the sole basis for treatment or other patient management decisions. A negative result may occur with  improper specimen collection/handling, submission of specimen other than nasopharyngeal swab, presence of viral mutation(s) within the areas targeted by this assay, and inadequate number of viral copies(<138 copies/mL). A negative result must be combined with clinical observations, patient history, and epidemiological information. The expected result is Negative.  Fact Sheet for Patients:  EntrepreneurPulse.com.au  Fact Sheet for Healthcare Providers:  IncredibleEmployment.be  This test is no t yet approved or cleared by the [place] FDA and  has been authorized for detection and/or diagnosis of SARS-CoV-2 by FDA under an Emergency Use Authorization (EUA). This EUA will remain  in effect (meaning this test can be used) for the duration of the COVID-19 declaration under Section 564(b)(1) of the Act, 21 U.S.C.section 360bbb-3(b)(1), unless  the authorization is terminated  or revoked sooner.       Influenza A by PCR NEGATIVE NEGATIVE Final   Influenza B by PCR NEGATIVE NEGATIVE Final    Comment: (NOTE) The Xpert Xpress SARS-CoV-2/FLU/RSV plus assay is intended as an aid in the diagnosis of influenza from Nasopharyngeal swab specimens and should not be used as a sole basis for treatment. Nasal washings and aspirates are unacceptable for Xpert Xpress SARS-CoV-2/FLU/RSV testing.  Fact Sheet for Patients: EntrepreneurPulse.com.au  Fact Sheet for Healthcare Providers: IncredibleEmployment.be  This test is not yet approved or cleared by the [place]  FDA and has been authorized for detection and/or diagnosis of SARS-CoV-2 by FDA under an Emergency Use Authorization (EUA). This EUA will remain in effect (meaning this test can be used) for the duration of the COVID-19 declaration under Section 564(b)(1) of the Act, 21 U.S.C. section 360bbb-3(b)(1), unless the authorization is terminated or revoked.  Performed at [facility], [address] [place], [place] [address]   MRSA Next Gen by PCR, Nasal     Status: None   Collection Time: 06/14/21  5:13 PM   Specimen: Nasal Mucosa; Nasal Swab  Result Value Ref Range Status   MRSA by PCR Next Gen NOT DETECTED NOT DETECTED Final    Comment: (NOTE) The GeneXpert MRSA Assay (FDA approved for NASAL specimens only), is one component of a comprehensive MRSA colonization surveillance program. It is not intended to diagnose MRSA infection nor to guide or monitor treatment for MRSA infections. Test performance is not FDA approved in patients less than 23 years old. Performed at [facility], [address] [place], [place] [address]   Expectorated Sputum Assessment w Gram Stain, Rflx to Resp Cult     Status: None (Preliminary result)   Collection Time: 06/14/21  6:40 PM   Specimen: Sputum  Result Value Ref Range Status   Specimen Description  SPUTUM  Final   Special Requests NONE  Final   Sputum evaluation   Final    THIS SPECIMEN IS ACCEPTABLE FOR SPUTUM CULTURE Performed at [facility], [address] [place], [place] [address]    Report Status PENDING  Incomplete  Culture, Respiratory w Gram Stain     Status: None   Collection Time: 06/14/21  6:40 PM   Specimen: SPU  Result Value Ref Range Status   Specimen Description   Final    SPUTUM Performed at [facility], [address], [place], [place] [address]    Special Requests   Final    NONE Reflexed from [id number] Performed at [facility], [address], [place], [place] [address]    Gram Stain   Final    RARE SQUAMOUS EPITHELIAL CELLS PRESENT FEW WBC PRESENT,BOTH PMN AND MONONUCLEAR RARE GRAM POSITIVE COCCI    Culture   Final    FEW Normal respiratory flora-no Staph aureus or Pseudomonas seen Performed at [facility], [place] [address], [place],  [address]    Report Status 06/17/2021 FINAL  Final  Respiratory (~20 pathogens) panel by PCR     Status: None   Collection Time: 06/14/21  8:20 PM   Specimen: Expectorated Sputum; Respiratory  Result Value Ref Range Status   Adenovirus NOT DETECTED NOT DETECTED Final   Coronavirus 229E NOT DETECTED NOT DETECTED Final    Comment: (NOTE) The Coronavirus on the Respiratory Panel, DOES NOT test for the novel  Coronavirus (2019 nCoV)    Coronavirus HKU1 NOT DETECTED NOT DETECTED Final   Coronavirus NL63 NOT DETECTED NOT DETECTED Final   Coronavirus OC43 NOT DETECTED NOT DETECTED Final   Metapneumovirus NOT DETECTED NOT DETECTED Final   Rhinovirus / Enterovirus NOT DETECTED NOT DETECTED Final   Influenza A NOT DETECTED NOT DETECTED Final   Influenza B NOT DETECTED NOT DETECTED Final   Parainfluenza Virus 1 NOT DETECTED NOT DETECTED Final   Parainfluenza Virus 2 NOT DETECTED NOT DETECTED Final   Parainfluenza Virus 3 NOT DETECTED NOT DETECTED Final   Parainfluenza Virus 4 NOT DETECTED NOT DETECTED Final    Respiratory Syncytial Virus NOT DETECTED NOT DETECTED Final  Bordetella pertussis NOT DETECTED NOT DETECTED Final   Bordetella Parapertussis NOT DETECTED NOT DETECTED Final   Chlamydophila pneumoniae NOT DETECTED NOT DETECTED Final   Mycoplasma pneumoniae NOT DETECTED NOT DETECTED Final    Comment: Performed at [facility], [place] [address], [place], [place] [address]  Culture, blood (Routine X 2) w Reflex to ID Panel     Status: None (Preliminary result)   Collection Time: 06/15/21  1:45 AM   Specimen: BLOOD RIGHT HAND  Result Value Ref Range Status   Specimen Description BLOOD RIGHT HAND  Final   Special Requests   Final    BOTTLES DRAWN AEROBIC AND ANAEROBIC Blood Culture adequate volume   Culture   Final    NO GROWTH 2 DAYS Performed at [facility], [address] [place],  [address]    Report Status PENDING  Incomplete      Radiology Studies: DG Chest 2 View  Result Date: 06/16/2021 CLINICAL DATA:  Shortness of breath, cough EXAM: CHEST - 2 VIEW COMPARISON:  06/14/2021 FINDINGS: Transverse diameter of heart is slightly increased. Pulmonary vascularity is unremarkable. There is interval clearing of increased interstitial markings in both lungs, more so on the right side suggesting interval resolution of pneumonitis or asymmetric pulmonary edema. There are no new infiltrates. There is no pleural effusion or pneumothorax. IMPRESSION: Interval resolution of diffuse interstitial edema/interstitial pneumonia since 06/14/2021. There are no new infiltrates. There is no pleural effusion or pneumothorax. Electronically Signed   By: Elmer Picker M.D.   On: 06/16/2021 09:56   ECHOCARDIOGRAM COMPLETE  Result Date: 06/16/2021    ECHOCARDIOGRAM REPORT   Patient Name:   Francisco Robinson Date of Exam: 06/16/2021 Medical Rec #:  [id number]        Height:       66.0 in Accession #:    [id number]       Weight:       113.5 lb Date of Birth:  May 11, 1961        BSA:          1.572 m  Patient Age:    84 years         BP:           90/68 mmHg Patient Gender: M                HR:           98 bpm. Exam Location:  [facility] Procedure: 2D Echo, Cardiac Doppler and Color Doppler Indications:    Congestive Heart Failure  History:        Patient has no prior history of Echocardiogram examinations.                 Previous Myocardial Infarction, COPD and Stroke; Risk                 Factors:Diabetes. Alcohol abuse.  Sonographer:    Wenda Low Referring Phys: Ashley  1. Left ventricular ejection fraction, by estimation, is 30 to 35%. The left ventricle has moderately decreased function. The left ventricle demonstrates regional wall motion abnormalities (suggestive of prior RCA infact). There is mild concentric left ventricular hypertrophy. Left ventricular diastolic parameters are consistent with Grade I diastolic dysfunction (impaired relaxation).  2. Right ventricular systolic function is normal. The right ventricular size is normal. Tricuspid regurgitation signal is inadequate for assessing PA pressure.  3. The mitral valve is grossly normal. Trivial mitral valve regurgitation. No evidence of  mitral stenosis.  4. The aortic valve was not well visualized but calcified greater than expected for age. Cannot excluded bicuspid valve. There is moderate calcification of the aortic valve. Aortic valve regurgitation is not visualized. Mild to moderate aortic valve sclerosis/calcification is present, without any evidence of aortic stenosis. Aortic valve mean gradient measures 11.0 mmHg. Aortic valve Vmax measures 2.22 m/s. Comparison(s): No prior Echocardiogram. FINDINGS  Left Ventricle: Left ventricular ejection fraction, by estimation, is 30 to 35%. The left ventricle has moderately decreased function. The left ventricle demonstrates regional wall motion abnormalities. The left ventricular internal cavity size was normal in size. There is mild concentric left ventricular  hypertrophy. Left ventricular diastolic parameters are consistent with Grade I diastolic dysfunction (impaired relaxation).  LV Wall Scoring: The mid and distal lateral wall, entire inferior wall, posterior wall, mid anterolateral segment, mid inferoseptal segment, and basal inferoseptal segment are hypokinetic. Right Ventricle: The right ventricular size is normal. No increase in right ventricular wall thickness. Right ventricular systolic function is normal. Tricuspid regurgitation signal is inadequate for assessing PA pressure. Left Atrium: Left atrial size was normal in size. Right At[facility]: Right atrial size was normal in size. Pericardium: There is no evidence of pericardial effusion. Mitral Valve: The mitral valve is grossly normal. Mild mitral annular calcification. Trivial mitral valve regurgitation. No evidence of mitral valve stenosis. MV peak gradient, 3.2 mmHg. The mean mitral valve gradient is 2.0 mmHg. Tricuspid Valve: The tricuspid valve is normal in structure. Tricuspid valve regurgitation is not demonstrated. Aortic Valve: The aortic valve was not well visualized. There is moderate calcification of the aortic valve. Aortic valve regurgitation is not visualized. Mild to moderate aortic valve sclerosis/calcification is present, without any evidence of aortic stenosis. Aortic valve mean gradient measures 11.0 mmHg. Aortic valve peak gradient measures 19.8 mmHg. Aortic valve area, by VTI measures 1.55 cm. Pulmonic Valve: The pulmonic valve was grossly normal. Pulmonic valve regurgitation is trivial. No evidence of pulmonic stenosis. Aorta: The aortic root and ascending aorta are structurally normal, with no evidence of dilitation. IAS/Shunts: The atrial septum is grossly normal.  LEFT VENTRICLE PLAX 2D LVIDd:         5.20 cm      Diastology LVIDs:         4.30 cm      LV e' medial:    6.85 cm/s LV PW:         1.20 cm      LV E/e' medial:  12.5 LV IVS:        1.20 cm      LV e' lateral:   8.70 cm/s LVOT  diam:     2.20 cm      LV E/e' lateral: 9.8 LV SV:         61 LV SV Index:   39 LVOT Area:     3.80 cm  LV Volumes (MOD) LV vol d, MOD A2C: 101.0 ml LV vol d, MOD A4C: 92.4 ml LV vol s, MOD A2C: 55.9 ml LV vol s, MOD A4C: 68.7 ml LV SV MOD A2C:     45.1 ml LV SV MOD A4C:     92.4 ml LV SV MOD BP:      37.5 ml RIGHT VENTRICLE RV Basal diam:  3.30 cm RV Mid diam:    2.60 cm RV S prime:     18.10 cm/s TAPSE (M-mode): 1.6 cm LEFT ATRIUM             Index  RIGHT ATRIUM           Index LA diam:        3.40 cm 2.16 cm/m   RA Area:     15.10 cm LA Vol (A2C):   42.2 ml 26.84 ml/m  RA Volume:   42.00 ml  26.72 ml/m LA Vol (A4C):   41.8 ml 26.59 ml/m LA Biplane Vol: 43.5 ml 27.67 ml/m  AORTIC VALVE                     PULMONIC VALVE AV Area (Vmax):    1.57 cm      PV Vmax:       0.79 m/s AV Area (Vmean):   1.68 cm      PV Peak grad:  2.5 mmHg AV Area (VTI):     1.55 cm AV Vmax:           222.50 cm/s AV Vmean:          150.500 cm/s AV VTI:            0.395 m AV Peak Grad:      19.8 mmHg AV Mean Grad:      11.0 mmHg LVOT Vmax:         92.00 cm/s LVOT Vmean:        66.500 cm/s LVOT VTI:          0.161 m LVOT/AV VTI ratio: 0.41  AORTA Ao Root diam: 3.30 cm MITRAL VALVE MV Area (PHT): 5.62 cm    SHUNTS MV Area VTI:   3.12 cm    Systemic VTI:  0.16 m MV Peak grad:  3.2 mmHg    Systemic Diam: 2.20 cm MV Mean grad:  2.0 mmHg MV Vmax:       0.90 m/s MV Vmean:      60.1 cm/s MV Decel Time: 135 msec MV E velocity: 85.30 cm/s MV A velocity: 46.70 cm/s MV E/A ratio:  1.83 Rudean Haskell MD Electronically signed by Rudean Haskell MD Signature Date/Time: 06/16/2021/1:52:44 PM    Final      Scheduled Meds:  aspirin EC  81 mg Oral Q breakfast   atorvastatin  40 mg Oral Daily   bisoprolol-hydrochlorothiazide  1 tablet Oral Daily   chlordiazePOXIDE  10 mg Oral QID   dextromethorphan-guaiFENesin  1 tablet Oral BID   doxycycline  100 mg Oral Q12H   enoxaparin (LOVENOX) injection  40 mg Subcutaneous Q24H    fluticasone furoate-vilanterol  1 puff Inhalation Daily   folic acid  1 mg Oral Daily   furosemide  40 mg Intravenous Daily   insulin aspart  0-15 Units Subcutaneous TID WC   insulin aspart  0-5 Units Subcutaneous QHS   ipratropium-albuterol  3 mL Nebulization BID   methylPREDNISolone (SOLU-MEDROL) injection  40 mg Intravenous Q12H   multivitamin with minerals  1 tablet Oral Daily   nicotine  21 mg Transdermal Daily   potassium chloride  40 mEq Oral BID   thiamine  100 mg Oral Daily   Or   thiamine  100 mg Intravenous Daily   Continuous Infusions:   LOS: 3 days    Roxan Hockey M.D on 06/17/2021 at 5:36 PM  Go to [email or web address] - for contact info  [facility] - Office  [phone]  If 7PM-7AM, please contact night-coverage [email or web address] Password [facility] 06/17/2021, 5:36 PM

## 2021-06-17 NOTE — Progress Notes (Addendum)
1923: Pt was given Ativan, see CIWA and MAR. Pt's bed alarm went off at shift change. Pt was already in the bathroom and was very unsteady. Pt got a cup and got water to drink from the toilet. Pt ask orientation questions and was alert to person, place, and month.   2157: Pt was given Ativan again, see CIWA and MAR. Pt removed gown and cardiac monitor. Pt states "I am not wearing all of this shit". Central Tele and physician informed that pt is refusing to wear cardiac monitor.  2334: Pt was given Ativan again, see CIWA and MAR. Pt stated "I need a pack of cigarettes, a Perc 10, and a case of beer". Pt was pacing in his room at this time. Pt then said his friend sells Perc 10's and he really wishes he was around right now. When nurse went back into the room Pt asked again for a Perc 10 and a cup of coffee.  Bernadette Hoit notified with no new orders. Will continue to monitor.   0315: Pt was given Ativan again, see CIWA and MAR. Pt's bed alarm went off and he managed to make it to the stairwell. Pt stated "I am looking for my wallet because I need my money". Pt is in bed with bed alarm on.  0514: Bernadette Hoit notified pt has received large doses of Ativan throughout the night and continues to be persistent about going to the store to get cigarettes and beer.   4782: I spoke with Bernadette Hoit over the phone and he ordered Librium and discontinued Ativan.
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
## 2021-06-17 NOTE — Progress Notes (Signed)
Pt sleeping soundly. Awakens to name called and touch, answers yes and no questions but other speech unintelligible. Immediately back to sleep. Attempted to get pt to take po fluids, unable due to drowsiness. MD Courage updated on pt condition and response to med. 1:1 safety sitter remains at bedside.

## 2021-06-17 NOTE — Progress Notes (Signed)
Pt given IM Haldol per order, pt agreeable to injection, although still cursing staff, yelling, and attempting to leave floor. Pt did eat all of his breakfast.

## 2021-06-18 DIAGNOSIS — R0689 Other abnormalities of breathing: Secondary | ICD-10-CM

## 2021-06-18 DIAGNOSIS — R0600 Dyspnea, unspecified: Secondary | ICD-10-CM

## 2021-06-18 LAB — COMPREHENSIVE METABOLIC PANEL
ALT: 51 U/L — ABNORMAL HIGH (ref 0–44)
AST: 91 U/L — ABNORMAL HIGH (ref 15–41)
Albumin: 3.1 g/dL — ABNORMAL LOW (ref 3.5–5.0)
Alkaline Phosphatase: 127 U/L — ABNORMAL HIGH (ref 38–126)
Anion gap: 9 (ref 5–15)
BUN: 23 mg/dL — ABNORMAL HIGH (ref 6–20)
CO2: 24 mmol/L (ref 22–32)
Calcium: 9 mg/dL (ref 8.9–10.3)
Chloride: 97 mmol/L — ABNORMAL LOW (ref 98–111)
Creatinine, Ser: 0.68 mg/dL (ref 0.61–1.24)
GFR, Estimated: >60 mL/min (ref >60)
Glucose, Bld: 207 mg/dL — ABNORMAL HIGH (ref 70–99)
Potassium: 4.3 mmol/L (ref 3.5–5.1)
Sodium: 130 mmol/L — ABNORMAL LOW (ref 135–145)
Total Bilirubin: 0.8 mg/dL (ref 0.3–1.2)
Total Protein: 7 g/dL (ref 6.5–8.1)

## 2021-06-18 LAB — CBC
HCT: 41 % (ref 39.0–52.0)
Hemoglobin: 14.1 g/dL (ref 13.0–17.0)
MCH: 34.9 pg — ABNORMAL HIGH (ref 26.0–34.0)
MCHC: 34.4 g/dL (ref 30.0–36.0)
MCV: 101.5 fL — ABNORMAL HIGH (ref 80.0–100.0)
Platelets: 207 10*3/uL (ref 150–400)
RBC: 4.04 MIL/uL — ABNORMAL LOW (ref 4.22–5.81)
RDW: 12.9 % (ref 11.5–15.5)
WBC: 9.3 10*3/uL (ref 4.0–10.5)
nRBC: 0 % (ref 0.0–0.2)

## 2021-06-18 LAB — EXPECTORATED SPUTUM ASSESSMENT W GRAM STAIN, RFLX TO RESP C

## 2021-06-18 LAB — GLUCOSE, CAPILLARY
Glucose-Capillary: 159 mg/dL — ABNORMAL HIGH (ref 70–99)
Glucose-Capillary: 173 mg/dL — ABNORMAL HIGH (ref 70–99)
Glucose-Capillary: 174 mg/dL — ABNORMAL HIGH (ref 70–99)
Glucose-Capillary: 128 mg/dL — ABNORMAL HIGH (ref 70–99)

## 2021-06-18 MED ORDER — PREDNISONE 20 MG PO TABS
40.0000 mg | ORAL_TABLET | Freq: Every day | ORAL | Status: DC
  Administered 2021-06-19: 40 mg via ORAL
  Filled 2021-06-18: qty 2

## 2021-06-18 MED ORDER — FUROSEMIDE 20 MG PO TABS
20.0000 mg | ORAL_TABLET | Freq: Every day | ORAL | Status: DC
  Administered 2021-06-18 – 2021-06-19 (×2): 20 mg via ORAL
  Filled 2021-06-18 (×2): qty 1

## 2021-06-18 NOTE — Plan of Care (Addendum)
  Problem: Acute Rehab PT Goals(only PT should resolve) Goal: Patient Will Transfer Sit To/From Stand Outcome: Progressing Flowsheets (Taken 06/18/2021 1113) Patient will transfer sit to/from stand: Independently Goal: Pt Will Transfer Bed To Chair/Chair To Bed Outcome: Progressing Flowsheets (Taken 06/18/2021 1113) Pt will Transfer Bed to Chair/Chair to Bed: with modified independence Goal: Pt Will Ambulate Outcome: Progressing Flowsheets (Taken 06/18/2021 1114) Pt will Ambulate:  50 feet  with modified independence  with rolling walker   Cassie Jones, SPT  During this treatment session, the therapist was present, participating in and directing the treatment.  12:14 PM, 06/18/21 Lonell Grandchild, MPT Physical Therapist with [facility] [phone] office [phone] mobile phone

## 2021-06-18 NOTE — Plan of Care (Signed)

## 2021-06-18 NOTE — Progress Notes (Signed)
Patient is exhibiting Delirium from withdrawal.  Did not wake for breathing treatment and no distress is noted when I went by room, so Bipap is not needed at this time.

## 2021-06-18 NOTE — Progress Notes (Signed)
PROGRESS NOTE  Francisco Robinson GGY:[id number] DOB: Aug 08, 1961 DOA: 06/14/2021 PCP: Rosita Fire, MD   LOS: 4 days   Brief Narrative / Interim history: 60 year old male with history of COPD, prior MI, heavy EtOH use, DM2, seizures in the setting of EtOH withdrawal comes into the hospital on 11/5 due to shortness of breath, was found to have acute hypoxic respiratory failure due to COPD exacerbation and acute on chronic systolic CHF.  Hospital course complicated by severe alcohol withdrawal /intermittent confusion  Subjective / 24h Interval events: Sleepy this morning, but wakes up, knows that he is at any [facility] but cannot answer me the year.  He is aware that he is doing from alcohol  Assessment & Plan: Principal Problem Acute hypoxic respiratory failure due to acute COPD exacerbation and acute on chronic combined CHF-his 2D echo shows an EF of 30-35%.  Patient has diuresed with furosemide well, he is net negative almost 5 L this morning and is on room air.  He appears euvolemic, switch to oral Lasix.  Does not appear to be on ACE inhibitor/ARB, consider prior to discharge based on his compliance -Wheezing has almost resolved, stop IV steroids and placed on prednisone. Continue doxycycline  Active Problems DTs /severe alcohol withdrawal, EtOH abuse-patient with significant tremors, agitation.  CIWA score is starting to get better, was 21 yesterday and overnight has been as high as 10.  He appears calmer this morning.  There is a component of alcohol withdrawal delirium which appears to be clearing up.  PT consult today -Continue Librium 10 mg QID, CIWA with Ativan  History of CVA, history of CAD-continue home medications with aspirin, atorvastatin  DM2-A1c 5.5.  Likely poor p.o. intake due to heavy EtOH.  Continue sliding scale while here  CBG (last 3)  Recent Labs    06/17/21 2047 06/18/21 0737 06/18/21 1126  GLUCAP 200* 159* 173*   Hyponatremia-monitor, suspect beer  potomania.  Hyperlipidemia-continue statin  Scheduled Meds:  aspirin EC  81 mg Oral Q breakfast   atorvastatin  40 mg Oral Daily   bisoprolol-hydrochlorothiazide  1 tablet Oral Daily   chlordiazePOXIDE  10 mg Oral QID   dextromethorphan-guaiFENesin  1 tablet Oral BID   doxycycline  100 mg Oral Q12H   enoxaparin (LOVENOX) injection  40 mg Subcutaneous Q24H   fluticasone furoate-vilanterol  1 puff Inhalation Daily   folic acid  1 mg Oral Daily   furosemide  20 mg Oral Daily   insulin aspart  0-15 Units Subcutaneous TID WC   insulin aspart  0-5 Units Subcutaneous QHS   ipratropium-albuterol  3 mL Nebulization BID   multivitamin with minerals  1 tablet Oral Daily   nicotine  21 mg Transdermal Daily   potassium chloride  40 mEq Oral BID   predniSONE  40 mg Oral Q breakfast   thiamine  100 mg Oral Daily   Or   thiamine  100 mg Intravenous Daily   Continuous Infusions: PRN Meds:.albuterol, aspirin-acetaminophen-caffeine, hydrOXYzine, loperamide, LORazepam **OR** LORazepam, ondansetron **OR** ondansetron (ZOFRAN) IV  Diet Orders (From admission, onward)     Start     Ordered   06/14/21 1701  Diet Heart Room service appropriate? Yes; Fluid consistency: Thin  Diet effective now       Question Answer Comment  Room service appropriate? Yes   Fluid consistency: Thin      06/14/21 1700            DVT prophylaxis: enoxaparin (LOVENOX) injection 40 mg Start: 06/14/21 1800  Code Status: Full Code  Family Communication: no family at bedside   Status is: Inpatient  Remains inpatient appropriate because: Persistent delirium, ongoing withdrawal symptoms  Level of care: Med-Surg  Consultants:  None   Procedures:  2D echo  Microbiology  none  Antimicrobials: none    Objective: Vitals:   06/18/21 0350 06/18/21 0500 06/18/21 0732 06/18/21 1045  BP: 94/65  108/81 107/84  Pulse: (!) 56  (!) 102 (!) 107  Resp: 15  20 20   Temp:      TempSrc:      SpO2: 95%  96%  98%  Weight:  51.3 kg    Height:        Intake/Output Summary (Last 24 hours) at 06/18/2021 1204 Last data filed at 06/18/2021 0843 Gross per 24 hour  Intake 2520 ml  Output 2950 ml  Net -430 ml   Filed Weights   06/16/21 0500 06/17/21 0508 06/18/21 0500  Weight: 51.5 kg 51.3 kg 51.3 kg    Examination:  Constitutional: NAD sleepy Eyes: no scleral icterus ENMT: Mucous membranes are dry.  Neck: normal, supple Respiratory: Overall distant breath sounds but clear, no wheezing or crackles appreciated Cardiovascular: Regular rate and rhythm, no murmurs / rubs / gallops.  Trace edema Abdomen: non distended, no tenderness. Bowel sounds positive.  Musculoskeletal: no clubbing / cyanosis.  Skin: no rashes Neurologic: Does not fully participate in neuro exam but overall nonfocal  Data Reviewed: I have independently reviewed following labs and imaging studies   CBC: Recent Labs  Lab 06/14/21 1249 06/15/21 0145 06/16/21 0424 06/18/21 0521  WBC 11.0* 10.5 17.4* 9.3  NEUTROABS 9.1*  --   --   --   HGB 13.7 14.9 13.2 14.1  HCT 39.9 42.1 38.8* 41.0  MCV 101.8* 100.0 101.0* 101.5*  PLT 211 207 193 629   Basic Metabolic Panel: Recent Labs  Lab 06/14/21 1249 06/15/21 0145 06/16/21 0424 06/18/21 0521  NA 133* 133* 129* 130*  K 3.8 3.4* 4.0 4.3  CL 100 97* 96* 97*  CO2 23 26 25 24   GLUCOSE 152* 189* 161* 207*  BUN 5* 15 23* 23*  CREATININE 0.62 0.72 0.82 0.68  CALCIUM 8.4* 9.0 8.6* 9.0   Liver Function Tests: Recent Labs  Lab 06/14/21 1249 06/18/21 0521  AST 153* 91*  ALT 58* 51*  ALKPHOS 156* 127*  BILITOT 0.7 0.8  PROT 7.5 7.0  ALBUMIN 3.5 3.1*   Coagulation Profile: No results for input(s): INR, PROTIME in the last 168 hours. HbA1C: No results for input(s): HGBA1C in the last 72 hours. CBG: Recent Labs  Lab 06/17/21 1158 06/17/21 1648 06/17/21 2047 06/18/21 0737 06/18/21 1126  GLUCAP 120* 182* 200* 159* 173*    Recent Results (from the past 240  hour(s))  Resp Panel by RT-PCR (Flu A&B, Covid) Nasopharyngeal Swab     Status: None   Collection Time: 06/14/21  3:28 PM   Specimen: Nasopharyngeal Swab; Nasopharyngeal(NP) swabs in vial transport medium  Result Value Ref Range Status   SARS Coronavirus 2 by RT PCR NEGATIVE NEGATIVE Final    Comment: (NOTE) SARS-CoV-2 target nucleic acids are NOT DETECTED.  The SARS-CoV-2 RNA is generally detectable in upper respiratory specimens during the acute phase of infection. The lowest concentration of SARS-CoV-2 viral copies this assay can detect is 138 copies/mL. A negative result does not preclude SARS-Cov-2 infection and should not be used as the sole basis for treatment or other patient management decisions. A negative result may occur with  improper specimen collection/handling, submission of specimen other than nasopharyngeal swab, presence of viral mutation(s) within the areas targeted by this assay, and inadequate number of viral copies(<138 copies/mL). A negative result must be combined with clinical observations, patient history, and epidemiological information. The expected result is Negative.  Fact Sheet for Patients:  EntrepreneurPulse.com.au  Fact Sheet for Healthcare Providers:  IncredibleEmployment.be  This test is no t yet approved or cleared by the [place] FDA and  has been authorized for detection and/or diagnosis of SARS-CoV-2 by FDA under an Emergency Use Authorization (EUA). This EUA will remain  in effect (meaning this test can be used) for the duration of the COVID-19 declaration under Section 564(b)(1) of the Act, 21 U.S.C.section 360bbb-3(b)(1), unless the authorization is terminated  or revoked sooner.       Influenza A by PCR NEGATIVE NEGATIVE Final   Influenza B by PCR NEGATIVE NEGATIVE Final    Comment: (NOTE) The Xpert Xpress SARS-CoV-2/FLU/RSV plus assay is intended as an aid in the diagnosis of influenza from  Nasopharyngeal swab specimens and should not be used as a sole basis for treatment. Nasal washings and aspirates are unacceptable for Xpert Xpress SARS-CoV-2/FLU/RSV testing.  Fact Sheet for Patients: EntrepreneurPulse.com.au  Fact Sheet for Healthcare Providers: IncredibleEmployment.be  This test is not yet approved or cleared by the [place] FDA and has been authorized for detection and/or diagnosis of SARS-CoV-2 by FDA under an Emergency Use Authorization (EUA). This EUA will remain in effect (meaning this test can be used) for the duration of the COVID-19 declaration under Section 564(b)(1) of the Act, 21 U.S.C. section 360bbb-3(b)(1), unless the authorization is terminated or revoked.  Performed at [facility], [address], [place], [place] [address]   MRSA Next Gen by PCR, Nasal     Status: None   Collection Time: 06/14/21  5:13 PM   Specimen: Nasal Mucosa; Nasal Swab  Result Value Ref Range Status   MRSA by PCR Next Gen NOT DETECTED NOT DETECTED Final    Comment: (NOTE) The GeneXpert MRSA Assay (FDA approved for NASAL specimens only), is one component of a comprehensive MRSA colonization surveillance program. It is not intended to diagnose MRSA infection nor to guide or monitor treatment for MRSA infections. Test performance is not FDA approved in patients less than 15 years old. Performed at [facility], [address], [place], [place] [address]   Expectorated Sputum Assessment w Gram Stain, Rflx to Resp Cult     Status: None   Collection Time: 06/14/21  6:40 PM   Specimen: Sputum  Result Value Ref Range Status   Specimen Description SPUTUM  Final   Special Requests NONE  Final   Sputum evaluation   Final    THIS SPECIMEN IS ACCEPTABLE FOR SPUTUM CULTURE Performed at [facility], [address], [place], [place] [address]    Report Status 06/18/2021 FINAL  Final  Culture, Respiratory w Gram Stain     Status: None    Collection Time: 06/14/21  6:40 PM   Specimen: SPU  Result Value Ref Range Status   Specimen Description   Final    SPUTUM Performed at [facility], [address], [place], [place] [address]    Special Requests   Final    NONE Reflexed from [id number] Performed at [facility], [address] [place], [place] [address]    Gram Stain   Final    RARE SQUAMOUS EPITHELIAL CELLS PRESENT FEW WBC PRESENT,BOTH PMN AND MONONUCLEAR RARE Lonell Grandchild  POSITIVE COCCI    Culture   Final    FEW Normal respiratory flora-no Staph aureus or Pseudomonas seen Performed at [facility], [place] [address], [place], [place] [address]    Report Status 06/17/2021 FINAL  Final  Respiratory (~20 pathogens) panel by PCR     Status: None   Collection Time: 06/14/21  8:20 PM   Specimen: Expectorated Sputum; Respiratory  Result Value Ref Range Status   Adenovirus NOT DETECTED NOT DETECTED Final   Coronavirus 229E NOT DETECTED NOT DETECTED Final    Comment: (NOTE) The Coronavirus on the Respiratory Panel, DOES NOT test for the novel  Coronavirus (2019 nCoV)    Coronavirus HKU1 NOT DETECTED NOT DETECTED Final   Coronavirus NL63 NOT DETECTED NOT DETECTED Final   Coronavirus OC43 NOT DETECTED NOT DETECTED Final   Metapneumovirus NOT DETECTED NOT DETECTED Final   Rhinovirus / Enterovirus NOT DETECTED NOT DETECTED Final   Influenza A NOT DETECTED NOT DETECTED Final   Influenza B NOT DETECTED NOT DETECTED Final   Parainfluenza Virus 1 NOT DETECTED NOT DETECTED Final   Parainfluenza Virus 2 NOT DETECTED NOT DETECTED Final   Parainfluenza Virus 3 NOT DETECTED NOT DETECTED Final   Parainfluenza Virus 4 NOT DETECTED NOT DETECTED Final   Respiratory Syncytial Virus NOT DETECTED NOT DETECTED Final   Bordetella pertussis NOT DETECTED NOT DETECTED Final   Bordetella Parapertussis NOT DETECTED NOT DETECTED Final   Chlamydophila pneumoniae NOT DETECTED NOT DETECTED Final   Mycoplasma pneumoniae NOT DETECTED NOT DETECTED Final     Comment: Performed at [facility], [place]. [address], [place], [place] [address]  Culture, blood (Routine X 2) w Reflex to ID Panel     Status: None (Preliminary result)   Collection Time: 06/15/21  1:45 AM   Specimen: BLOOD RIGHT HAND  Result Value Ref Range Status   Specimen Description BLOOD RIGHT HAND  Final   Special Requests   Final    BOTTLES DRAWN AEROBIC AND ANAEROBIC Blood Culture adequate volume   Culture   Final    NO GROWTH 3 DAYS Performed at [facility], [address], [place],  [address]    Report Status PENDING  Incomplete     Radiology Studies: No results found.   Marzetta Board, MD, PhD [facility]  Between 7 am - 7 pm I am available, please contact me via Amion (for emergencies) or Securechat (non urgent messages)  Between 7 pm - 7 am I am not available, please contact night coverage MD/APP via Amion

## 2021-06-18 NOTE — Progress Notes (Signed)
Pt has shown gradual improvement in behavior today, still asking for beer and cigarettes but not cursing at staff. Pt oriented x4 at times then begins with rambling conversation about his home, his ex-wife, his weight loss, etc. Still wants to leave, worried about his home, asking why we took his house away. Reassured pt that his home is intact.   Pt has slept off and on all day, but awakens without difficulty, sits up to eat, feeds self, taking oral food and fluids well.  Congested cough but denies SOB. HR remains in 90's at rest and in low 100's with activity. Pt refused to bathe or shower today, refused oral care, refuses to change clothes, states, "I'll take a bath when I get home. I got Pantene and body wash at home! I got a flat screen and a brand new washing machine!" Pt becomes very irritable when pressed to complete tasks, refuses SQ Lovenox but has taken insulin without issue. Impulse when gets OOB to use bathroom, gait unsteady, requires one assist for safety. Redirectable when attempting to leave room. 1:1 safety sitter remains at bedside.  At 1615, pt called out and stated that he was due to be in court in [place] tomorrow and asked if we could alert court that he was in hospital. Pt agreeable to sign release of information form and MD dictated letter that was faxed to [employer] in [place] notifying them of pt's current hospital admission and inability to be in court tomorrow.
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
## 2021-06-19 DIAGNOSIS — I5020 Unspecified systolic (congestive) heart failure: Secondary | ICD-10-CM

## 2021-06-19 LAB — GLUCOSE, CAPILLARY: Glucose-Capillary: 111 mg/dL — ABNORMAL HIGH (ref 70–99)

## 2021-06-19 MED ORDER — ALBUTEROL SULFATE HFA 108 (90 BASE) MCG/ACT IN AERS: 2.0000 | INHALATION_SPRAY | Freq: Four times a day (QID) | RESPIRATORY_TRACT | 2 refills | Status: DC | PRN

## 2021-06-19 MED ORDER — NICOTINE 21 MG/24HR TD PT24: 21.0000 mg | MEDICATED_PATCH | Freq: Every day | TRANSDERMAL | 0 refills | Status: DC

## 2021-06-19 MED ORDER — ASPIRIN 81 MG PO TBEC: 81.0000 mg | DELAYED_RELEASE_TABLET | Freq: Every day | ORAL | 0 refills | Status: DC

## 2021-06-19 MED ORDER — FUROSEMIDE 20 MG PO TABS: 20.0000 mg | ORAL_TABLET | Freq: Every day | ORAL | 0 refills | Status: DC

## 2021-06-19 MED ORDER — FLUTICASONE FUROATE-VILANTEROL 100-25 MCG/ACT IN AEPB: 1.0000 | INHALATION_SPRAY | Freq: Every day | RESPIRATORY_TRACT | 1 refills | Status: DC

## 2021-06-19 MED ORDER — PREDNISONE 20 MG PO TABS: ORAL_TABLET | ORAL | 0 refills | Status: AC

## 2021-06-19 MED ORDER — DOXYCYCLINE HYCLATE 100 MG PO TABS: 100.0000 mg | ORAL_TABLET | Freq: Two times a day (BID) | ORAL | 0 refills | Status: AC

## 2021-06-19 MED ORDER — ATORVASTATIN CALCIUM 40 MG PO TABS: 40.0000 mg | ORAL_TABLET | Freq: Every day | ORAL | 0 refills | Status: DC

## 2021-06-19 MED ORDER — HYDROXYZINE HCL 25 MG PO TABS: 25.0000 mg | ORAL_TABLET | Freq: Four times a day (QID) | ORAL | 0 refills | Status: AC | PRN

## 2021-06-19 NOTE — Plan of Care (Signed)
  Problem: Education: Goal: Knowledge of General Education information will improve Description: Including pain rating scale, medication(s)/side effects and non-pharmacologic comfort measures 06/19/2021 0847 by Emmaline Life, RN Outcome: Adequate for Discharge 06/19/2021 0847 by Emmaline Life, RN Outcome: Adequate for Discharge   Problem: Clinical Measurements: Goal: Ability to maintain clinical measurements within normal limits will improve 06/19/2021 0847 by Emmaline Life, RN Outcome: Adequate for Discharge 06/19/2021 0847 by Emmaline Life, RN Outcome: Adequate for Discharge Goal: Will remain free from infection 06/19/2021 0847 by Emmaline Life, RN Outcome: Adequate for Discharge 06/19/2021 0847 by Emmaline Life, RN Outcome: Adequate for Discharge Goal: Diagnostic test results will improve 06/19/2021 0847 by Emmaline Life, RN Outcome: Adequate for Discharge 06/19/2021 0847 by Emmaline Life, RN Outcome: Adequate for Discharge Goal: Respiratory complications will improve 06/19/2021 0847 by Emmaline Life, RN Outcome: Adequate for Discharge 06/19/2021 0847 by Emmaline Life, RN Outcome: Adequate for Discharge Goal: Cardiovascular complication will be avoided 06/19/2021 0847 by Emmaline Life, RN Outcome: Adequate for Discharge 06/19/2021 0847 by Emmaline Life, RN Outcome: Adequate for Discharge   Problem: Nutrition: Goal: Adequate nutrition will be maintained 06/19/2021 0847 by Emmaline Life, RN Outcome: Adequate for Discharge 06/19/2021 0847 by Emmaline Life, RN Outcome: Adequate for Discharge   Problem: Coping: Goal: Level of anxiety will decrease 06/19/2021 0847 by Emmaline Life, RN Outcome: Adequate for Discharge 06/19/2021 0847 by Emmaline Life, RN Outcome: Adequate for Discharge   Problem: Elimination: Goal: Will not experience complications related to bowel motility 06/19/2021 0847 by Emmaline Life, RN Outcome: Adequate for Discharge 06/19/2021 0847 by Emmaline Life, RN Outcome: Adequate for Discharge Goal: Will not experience complications related to urinary retention 06/19/2021 0847 by Emmaline Life, RN Outcome: Adequate for Discharge 06/19/2021 0847 by Emmaline Life, RN Outcome: Adequate for Discharge   Problem: Pain Managment: Goal: General experience of comfort will improve 06/19/2021 0847 by Emmaline Life, RN Outcome: Adequate for Discharge 06/19/2021 0847 by Emmaline Life, RN Outcome: Adequate for Discharge   Problem: Safety: Goal: Ability to remain free from injury will improve 06/19/2021 0847 by Emmaline Life, RN Outcome: Adequate for Discharge 06/19/2021 0847 by Emmaline Life, RN Outcome: Adequate for Discharge   Problem: Skin Integrity: Goal: Risk for impaired skin integrity will decrease 06/19/2021 0847 by Emmaline Life, RN Outcome: Adequate for Discharge 06/19/2021 0847 by Emmaline Life, RN Outcome: Adequate for Discharge   Problem: Acute Rehab PT Goals(only PT should resolve) Goal: Patient Will Transfer Sit To/From Stand Outcome: Adequate for Discharge Goal: Pt Will Transfer Bed To Chair/Chair To Bed Outcome: Adequate for Discharge Goal: Pt Will Ambulate Outcome: Adequate for Discharge
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
## 2021-06-19 NOTE — Plan of Care (Signed)

## 2021-06-19 NOTE — Progress Notes (Signed)
Discharge instructions, RX's and follow up appts explained and provided to patient, patient verbalized understanding. Patients friend came to pick him up as soon as patient seen friend he jumped out of bed and left floor walking. Patient didn't wait for staff or wheelchair for his discharge.   Jackelynn Hosie, Tivis Ringer, RN

## 2021-06-19 NOTE — Clinical Social Work Note (Signed)
TOC went to patient's room to do CHF assessment and to assess need for SA resources. Per staff, patient had left.    Maclain Cohron, Clydene Pugh, LCSW

## 2021-06-19 NOTE — Plan of Care (Signed)
?  Problem: Education: ?Goal: Knowledge of General Education information will improve ?Description: Including pain rating scale, medication(s)/side effects and non-pharmacologic comfort measures ?Outcome: Adequate for Discharge ?  ?Problem: Health Behavior/Discharge Planning: ?Goal: Ability to manage health-related needs will improve ?Outcome: Adequate for Discharge ?  ?Problem: Clinical Measurements: ?Goal: Ability to maintain clinical measurements within normal limits will improve ?Outcome: Adequate for Discharge ?Goal: Will remain free from infection ?Outcome: Adequate for Discharge ?Goal: Diagnostic test results will improve ?Outcome: Adequate for Discharge ?Goal: Respiratory complications will improve ?Outcome: Adequate for Discharge ?Goal: Cardiovascular complication will be avoided ?Outcome: Adequate for Discharge ?  ?Problem: Nutrition: ?Goal: Adequate nutrition will be maintained ?Outcome: Adequate for Discharge ?  ?Problem: Coping: ?Goal: Level of anxiety will decrease ?Outcome: Adequate for Discharge ?  ?Problem: Pain Managment: ?Goal: General experience of comfort will improve ?Outcome: Adequate for Discharge ?  ?Problem: Safety: ?Goal: Ability to remain free from injury will improve ?Outcome: Adequate for Discharge ?  ?Problem: Skin Integrity: ?Goal: Risk for impaired skin integrity will decrease ?Outcome: Adequate for Discharge ?  ?

## 2021-06-20 LAB — CULTURE, BLOOD (ROUTINE X 2)
Culture: NO GROWTH
Special Requests: ADEQUATE

## 2021-07-05 ENCOUNTER — Emergency Department (HOSPITAL_COMMUNITY): Payer: Medicaid Other

## 2021-07-05 ENCOUNTER — Inpatient Hospital Stay (HOSPITAL_COMMUNITY)
Admission: EM | Admit: 2021-07-05 | Discharge: 2021-07-07 | DRG: 193 | Disposition: A | Discharge status: Home / Self Care | Payer: Medicaid Other | Attending: Internal Medicine | Admitting: Internal Medicine

## 2021-07-05 ENCOUNTER — Encounter (HOSPITAL_COMMUNITY): Payer: Self-pay | Admitting: Emergency Medicine

## 2021-07-05 ENCOUNTER — Other Ambulatory Visit: Payer: Self-pay

## 2021-07-05 DIAGNOSIS — J449 Chronic obstructive pulmonary disease, unspecified: Secondary | ICD-10-CM | POA: Diagnosis present

## 2021-07-05 DIAGNOSIS — F10231 Alcohol dependence with withdrawal delirium: Secondary | ICD-10-CM | POA: Diagnosis present

## 2021-07-05 DIAGNOSIS — G40909 Epilepsy, unspecified, not intractable, without status epilepticus: Secondary | ICD-10-CM | POA: Diagnosis present

## 2021-07-05 DIAGNOSIS — I429 Cardiomyopathy, unspecified: Secondary | ICD-10-CM | POA: Diagnosis not present

## 2021-07-05 DIAGNOSIS — Z79899 Other long term (current) drug therapy: Secondary | ICD-10-CM | POA: Diagnosis not present

## 2021-07-05 DIAGNOSIS — F1010 Alcohol abuse, uncomplicated: Secondary | ICD-10-CM | POA: Diagnosis not present

## 2021-07-05 DIAGNOSIS — I252 Old myocardial infarction: Secondary | ICD-10-CM | POA: Diagnosis not present

## 2021-07-05 DIAGNOSIS — I2510 Atherosclerotic heart disease of native coronary artery without angina pectoris: Secondary | ICD-10-CM | POA: Diagnosis present

## 2021-07-05 DIAGNOSIS — J189 Pneumonia, unspecified organism: Secondary | ICD-10-CM | POA: Diagnosis present

## 2021-07-05 DIAGNOSIS — J441 Chronic obstructive pulmonary disease with (acute) exacerbation: Secondary | ICD-10-CM | POA: Diagnosis not present

## 2021-07-05 DIAGNOSIS — Z8673 Personal history of transient ischemic attack (TIA), and cerebral infarction without residual deficits: Secondary | ICD-10-CM | POA: Diagnosis not present

## 2021-07-05 DIAGNOSIS — Z20822 Contact with and (suspected) exposure to covid-19: Secondary | ICD-10-CM | POA: Diagnosis present

## 2021-07-05 DIAGNOSIS — E119 Type 2 diabetes mellitus without complications: Secondary | ICD-10-CM

## 2021-07-05 DIAGNOSIS — R000 Tachycardia, unspecified: Secondary | ICD-10-CM

## 2021-07-05 DIAGNOSIS — I255 Ischemic cardiomyopathy: Secondary | ICD-10-CM | POA: Diagnosis present

## 2021-07-05 DIAGNOSIS — I5042 Chronic combined systolic (congestive) and diastolic (congestive) heart failure: Secondary | ICD-10-CM | POA: Diagnosis present

## 2021-07-05 DIAGNOSIS — J9601 Acute respiratory failure with hypoxia: Secondary | ICD-10-CM | POA: Diagnosis present

## 2021-07-05 DIAGNOSIS — G9340 Encephalopathy, unspecified: Secondary | ICD-10-CM

## 2021-07-05 DIAGNOSIS — J440 Chronic obstructive pulmonary disease with acute lower respiratory infection: Secondary | ICD-10-CM | POA: Diagnosis present

## 2021-07-05 DIAGNOSIS — F17210 Nicotine dependence, cigarettes, uncomplicated: Secondary | ICD-10-CM | POA: Diagnosis present

## 2021-07-05 DIAGNOSIS — Z7982 Long term (current) use of aspirin: Secondary | ICD-10-CM

## 2021-07-05 LAB — BLOOD GAS, ARTERIAL
Acid-base deficit: 2.3 mmol/L — ABNORMAL HIGH (ref 0.0–2.0)
Bicarbonate: 23.2 mmol/L (ref 20.0–28.0)
Drawn by: 27016
FIO2: 40
O2 Saturation: 98.7 %
Patient temperature: 37
pCO2 arterial: 29.6 mmHg — ABNORMAL LOW (ref 32.0–48.0)
pH, Arterial: 7.463 — ABNORMAL HIGH (ref 7.350–7.450)
pO2, Arterial: 135 mmHg — ABNORMAL HIGH (ref 83.0–108.0)

## 2021-07-05 LAB — CBC WITH DIFFERENTIAL/PLATELET
Abs Immature Granulocytes: 0.07 10*3/uL (ref 0.00–0.07)
Basophils Absolute: 0.1 10*3/uL (ref 0.0–0.1)
Basophils Relative: 0 %
Eosinophils Absolute: 0.1 10*3/uL (ref 0.0–0.5)
Eosinophils Relative: 1 %
HCT: 49 % (ref 39.0–52.0)
Hemoglobin: 16.2 g/dL (ref 13.0–17.0)
Immature Granulocytes: 1 %
Lymphocytes Relative: 17 %
Lymphs Abs: 2.1 10*3/uL (ref 0.7–4.0)
MCH: 33.7 pg (ref 26.0–34.0)
MCHC: 33.1 g/dL (ref 30.0–36.0)
MCV: 101.9 fL — ABNORMAL HIGH (ref 80.0–100.0)
Monocytes Absolute: 0.4 10*3/uL (ref 0.1–1.0)
Monocytes Relative: 3 %
Neutro Abs: 10 10*3/uL — ABNORMAL HIGH (ref 1.7–7.7)
Neutrophils Relative %: 78 %
Platelets: 225 10*3/uL (ref 150–400)
RBC: 4.81 MIL/uL (ref 4.22–5.81)
RDW: 12.4 % (ref 11.5–15.5)
WBC: 12.7 10*3/uL — ABNORMAL HIGH (ref 4.0–10.5)
nRBC: 0 % (ref 0.0–0.2)

## 2021-07-05 LAB — URINALYSIS, ROUTINE W REFLEX MICROSCOPIC
Bilirubin Urine: NEGATIVE
Glucose, UA: NEGATIVE mg/dL
Hgb urine dipstick: NEGATIVE
Ketones, ur: NEGATIVE mg/dL
Leukocytes,Ua: NEGATIVE
Nitrite: NEGATIVE
Protein, ur: NEGATIVE mg/dL
Specific Gravity, Urine: 1.02 (ref 1.005–1.030)
pH: 6 (ref 5.0–8.0)

## 2021-07-05 LAB — COMPREHENSIVE METABOLIC PANEL
ALT: 57 U/L — ABNORMAL HIGH (ref 0–44)
AST: 122 U/L — ABNORMAL HIGH (ref 15–41)
Albumin: 3.3 g/dL — ABNORMAL LOW (ref 3.5–5.0)
Alkaline Phosphatase: 106 U/L (ref 38–126)
Anion gap: 10 (ref 5–15)
BUN: 8 mg/dL (ref 6–20)
CO2: 20 mmol/L — ABNORMAL LOW (ref 22–32)
Calcium: 9 mg/dL (ref 8.9–10.3)
Chloride: 107 mmol/L (ref 98–111)
Creatinine, Ser: 0.74 mg/dL (ref 0.61–1.24)
GFR, Estimated: >60 mL/min (ref >60)
Glucose, Bld: 112 mg/dL — ABNORMAL HIGH (ref 70–99)
Potassium: 4.4 mmol/L (ref 3.5–5.1)
Sodium: 137 mmol/L (ref 135–145)
Total Bilirubin: 0.8 mg/dL (ref 0.3–1.2)
Total Protein: 6.8 g/dL (ref 6.5–8.1)

## 2021-07-05 LAB — RESP PANEL BY RT-PCR (FLU A&B, COVID) ARPGX2
Influenza A by PCR: NEGATIVE
Influenza B by PCR: NEGATIVE
SARS Coronavirus 2 by RT PCR: NEGATIVE

## 2021-07-05 LAB — TROPONIN I (HIGH SENSITIVITY)
Troponin I (High Sensitivity): 9 ng/L
Troponin I (High Sensitivity): 16 ng/L (ref <18)

## 2021-07-05 LAB — LACTIC ACID, PLASMA
Lactic Acid, Venous: 1.5 mmol/L (ref 0.5–1.9)
Lactic Acid, Venous: 1.8 mmol/L (ref 0.5–1.9)

## 2021-07-05 LAB — BRAIN NATRIURETIC PEPTIDE: B Natriuretic Peptide: 1631 pg/mL — ABNORMAL HIGH (ref 0.0–100.0)

## 2021-07-05 LAB — ETHANOL: Alcohol, Ethyl (B): <10 mg/dL (ref <10)

## 2021-07-05 MED ORDER — ALBUTEROL SULFATE (2.5 MG/3ML) 0.083% IN NEBU
5.0000 mg | INHALATION_SOLUTION | Freq: Once | RESPIRATORY_TRACT | Status: AC
  Administered 2021-07-05: 5 mg via RESPIRATORY_TRACT
  Filled 2021-07-05: qty 6

## 2021-07-05 MED ORDER — IPRATROPIUM-ALBUTEROL 0.5-2.5 (3) MG/3ML IN SOLN
3.0000 mL | Freq: Four times a day (QID) | RESPIRATORY_TRACT | Status: DC
  Administered 2021-07-05 – 2021-07-06 (×4): 3 mL via RESPIRATORY_TRACT
  Filled 2021-07-05 (×5): qty 3

## 2021-07-05 MED ORDER — FENTANYL CITRATE PF 50 MCG/ML IJ SOSY
50.0000 ug | PREFILLED_SYRINGE | Freq: Once | INTRAMUSCULAR | Status: AC
  Administered 2021-07-05: 50 ug via INTRAVENOUS
  Filled 2021-07-05: qty 1

## 2021-07-05 MED ORDER — SODIUM CHLORIDE 0.9 % IV SOLN: 2.0000 g | INTRAVENOUS | Status: DC

## 2021-07-05 MED ORDER — SODIUM CHLORIDE 0.9 % IV SOLN
500.0000 mg | Freq: Once | INTRAVENOUS | Status: AC
  Administered 2021-07-05: 500 mg via INTRAVENOUS
  Filled 2021-07-05: qty 500

## 2021-07-05 MED ORDER — SODIUM CHLORIDE 0.9 % IV SOLN
2.0000 g | INTRAVENOUS | Status: DC
  Administered 2021-07-06: 12:00:00 2 g via INTRAVENOUS
  Filled 2021-07-05 (×2): qty 20

## 2021-07-05 MED ORDER — LORAZEPAM 2 MG/ML IJ SOLN
1.0000 mg | Freq: Once | INTRAMUSCULAR | Status: AC
  Administered 2021-07-05: 1 mg via INTRAVENOUS
  Filled 2021-07-05: qty 1

## 2021-07-05 MED ORDER — SODIUM CHLORIDE 0.9 % IV SOLN
1.0000 g | Freq: Once | INTRAVENOUS | Status: AC
  Administered 2021-07-05: 1 g via INTRAVENOUS
  Filled 2021-07-05: qty 10

## 2021-07-05 MED ORDER — SODIUM CHLORIDE 0.9 % IV SOLN
500.0000 mg | INTRAVENOUS | Status: DC
  Administered 2021-07-06: 19:00:00 500 mg via INTRAVENOUS
  Filled 2021-07-05: qty 500

## 2021-07-05 MED ORDER — NICOTINE 21 MG/24HR TD PT24
21.0000 mg | MEDICATED_PATCH | Freq: Every day | TRANSDERMAL | Status: DC
  Filled 2021-07-05 (×2): qty 1

## 2021-07-05 MED ORDER — FOLIC ACID 1 MG PO TABS
1.0000 mg | ORAL_TABLET | Freq: Every day | ORAL | Status: DC
  Administered 2021-07-05: 1 mg via ORAL
  Filled 2021-07-05 (×2): qty 1

## 2021-07-05 MED ORDER — THIAMINE HCL 100 MG/ML IJ SOLN: 100.0000 mg | Freq: Every day | INTRAMUSCULAR | Status: DC

## 2021-07-05 MED ORDER — THIAMINE HCL 100 MG PO TABS
100.0000 mg | ORAL_TABLET | Freq: Every day | ORAL | Status: DC
  Administered 2021-07-05: 100 mg via ORAL
  Filled 2021-07-05 (×2): qty 1

## 2021-07-05 MED ORDER — ADULT MULTIVITAMIN W/MINERALS CH
1.0000 | ORAL_TABLET | Freq: Every day | ORAL | Status: DC
  Administered 2021-07-05: 1 via ORAL
  Filled 2021-07-05 (×2): qty 1

## 2021-07-05 MED ORDER — HEPARIN SODIUM (PORCINE) 5000 UNIT/ML IJ SOLN
5000.0000 [IU] | Freq: Three times a day (TID) | INTRAMUSCULAR | Status: DC
  Administered 2021-07-05 – 2021-07-06 (×2): 5000 [IU] via SUBCUTANEOUS
  Filled 2021-07-05 (×4): qty 1

## 2021-07-05 MED ORDER — PREDNISONE 20 MG PO TABS
40.0000 mg | ORAL_TABLET | Freq: Every day | ORAL | Status: DC
  Administered 2021-07-06: 19:00:00 40 mg via ORAL
  Filled 2021-07-05 (×2): qty 2

## 2021-07-05 MED ORDER — FUROSEMIDE 20 MG PO TABS
20.0000 mg | ORAL_TABLET | Freq: Every day | ORAL | Status: DC
  Administered 2021-07-05 – 2021-07-06 (×2): 20 mg via ORAL
  Filled 2021-07-05 (×3): qty 1

## 2021-07-05 MED ORDER — ASPIRIN EC 81 MG PO TBEC
81.0000 mg | DELAYED_RELEASE_TABLET | Freq: Every day | ORAL | Status: DC
  Filled 2021-07-05: qty 1

## 2021-07-05 MED ORDER — CHLORHEXIDINE GLUCONATE CLOTH 2 % EX PADS: 6.0000 | MEDICATED_PAD | Freq: Every day | CUTANEOUS | Status: DC

## 2021-07-05 MED ORDER — ACETAMINOPHEN 325 MG PO TABS
650.0000 mg | ORAL_TABLET | Freq: Four times a day (QID) | ORAL | Status: DC | PRN
  Administered 2021-07-05: 650 mg via ORAL
  Filled 2021-07-05: qty 2

## 2021-07-05 MED ORDER — METHYLPREDNISOLONE SODIUM SUCC 40 MG IJ SOLR
40.0000 mg | Freq: Two times a day (BID) | INTRAMUSCULAR | Status: AC
  Administered 2021-07-05 – 2021-07-06 (×2): 40 mg via INTRAVENOUS
  Filled 2021-07-05 (×2): qty 1

## 2021-07-05 MED ORDER — ATORVASTATIN CALCIUM 40 MG PO TABS
40.0000 mg | ORAL_TABLET | Freq: Every day | ORAL | Status: DC
  Administered 2021-07-05 – 2021-07-06 (×2): 40 mg via ORAL
  Filled 2021-07-05 (×3): qty 1

## 2021-07-05 MED ORDER — LORAZEPAM 1 MG PO TABS
1.0000 mg | ORAL_TABLET | ORAL | Status: DC | PRN
  Administered 2021-07-05 – 2021-07-06 (×2): 2 mg via ORAL
  Administered 2021-07-06: 03:00:00 1 mg via ORAL
  Administered 2021-07-06 – 2021-07-07 (×3): 2 mg via ORAL
  Administered 2021-07-07: 07:00:00 4 mg via ORAL
  Filled 2021-07-05: qty 1
  Filled 2021-07-05: qty 2
  Filled 2021-07-05: qty 4
  Filled 2021-07-05 (×4): qty 2

## 2021-07-05 MED ORDER — LORAZEPAM 2 MG/ML IJ SOLN
1.0000 mg | INTRAMUSCULAR | Status: DC | PRN
  Administered 2021-07-05: 1 mg via INTRAVENOUS
  Administered 2021-07-06 – 2021-07-07 (×3): 2 mg via INTRAVENOUS
  Filled 2021-07-05 (×4): qty 1

## 2021-07-05 MED ORDER — IPRATROPIUM BROMIDE 0.02 % IN SOLN
0.5000 mg | Freq: Once | RESPIRATORY_TRACT | Status: AC
  Administered 2021-07-05: 0.5 mg via RESPIRATORY_TRACT
  Filled 2021-07-05: qty 2.5

## 2021-07-05 NOTE — ED Notes (Signed)
Resp in room to and placing patient on Bipap

## 2021-07-05 NOTE — Progress Notes (Signed)
Pt refused to let nurses do CHG bath, change his gown, or do a skin assessment. Stated, "nothing is wrong with my skin." Will pass on to night shift RN that patient is refusing certain aspects of care.

## 2021-07-05 NOTE — ED Notes (Signed)
Pt found standing at sink with Bipap off, attempting to get top off bleach wipes, stating he is trying to get something to drink. Informed he has to stay in bed. Bleach wipes placed in cabinet. EDP made aware. Respiratory called to place back on Bipap.
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
## 2021-07-05 NOTE — Progress Notes (Signed)
Patient is currently 96% on RA and resting comfortably in bed at this time.  Bipap is not needed at this time.  Bipap is a PRN order.

## 2021-07-05 NOTE — ED Notes (Signed)
Patient taken off Bi-pap and given water per EDP's approval. Patient tolerating well.
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
## 2021-07-05 NOTE — ED Triage Notes (Signed)
Patient brought in via EMS from home. Alert and oriented. Airway patent. Patient c/o shortness of breath that started suddenly with waking this morning. Patient recently released from hospital (ICU) for respiratory distress. Patient hyperventilating at this time. Per paramedic patient O2 sat 92% on room upon their arrival. O2 sat 98% with albuterol neb. Patient sinus tachy on EKG done by paramedics.

## 2021-07-06 DIAGNOSIS — F1010 Alcohol abuse, uncomplicated: Secondary | ICD-10-CM | POA: Diagnosis not present

## 2021-07-06 DIAGNOSIS — E119 Type 2 diabetes mellitus without complications: Secondary | ICD-10-CM | POA: Diagnosis not present

## 2021-07-06 DIAGNOSIS — J441 Chronic obstructive pulmonary disease with (acute) exacerbation: Secondary | ICD-10-CM | POA: Diagnosis not present

## 2021-07-06 DIAGNOSIS — J189 Pneumonia, unspecified organism: Secondary | ICD-10-CM | POA: Diagnosis not present

## 2021-07-06 LAB — CBC
HCT: 48.3 % (ref 39.0–52.0)
Hemoglobin: 16 g/dL (ref 13.0–17.0)
MCH: 33.5 pg (ref 26.0–34.0)
MCHC: 33.1 g/dL (ref 30.0–36.0)
MCV: 101 fL — ABNORMAL HIGH (ref 80.0–100.0)
Platelets: 197 10*3/uL (ref 150–400)
RBC: 4.78 MIL/uL (ref 4.22–5.81)
RDW: 12.5 % (ref 11.5–15.5)
WBC: 14.9 10*3/uL — ABNORMAL HIGH (ref 4.0–10.5)
nRBC: 0 % (ref 0.0–0.2)

## 2021-07-06 LAB — BASIC METABOLIC PANEL
Anion gap: 11 (ref 5–15)
BUN: 12 mg/dL (ref 6–20)
CO2: 22 mmol/L (ref 22–32)
Calcium: 9 mg/dL (ref 8.9–10.3)
Chloride: 104 mmol/L (ref 98–111)
Creatinine, Ser: 0.81 mg/dL (ref 0.61–1.24)
GFR, Estimated: >60 mL/min (ref >60)
Glucose, Bld: 180 mg/dL — ABNORMAL HIGH (ref 70–99)
Potassium: 3.7 mmol/L (ref 3.5–5.1)
Sodium: 137 mmol/L (ref 135–145)

## 2021-07-06 LAB — MRSA NEXT GEN BY PCR, NASAL: MRSA by PCR Next Gen: NOT DETECTED

## 2021-07-06 LAB — STREP PNEUMONIAE URINARY ANTIGEN: Strep Pneumo Urinary Antigen: NEGATIVE

## 2021-07-06 MED ORDER — FUROSEMIDE 10 MG/ML IJ SOLN
40.0000 mg | Freq: Once | INTRAMUSCULAR | Status: AC
  Administered 2021-07-06: 19:00:00 40 mg via INTRAVENOUS
  Filled 2021-07-06: qty 4

## 2021-07-06 MED ORDER — GUAIFENESIN ER 600 MG PO TB12
600.0000 mg | ORAL_TABLET | Freq: Two times a day (BID) | ORAL | Status: DC
  Administered 2021-07-06: 20:00:00 600 mg via ORAL
  Filled 2021-07-06 (×2): qty 1

## 2021-07-06 NOTE — Progress Notes (Signed)
PT Cancellation Note  Patient Details Name: Rook Maue MRN: [id number] DOB: April 26, 1961   Cancelled Treatment:    Reason Eval/Treat Not Completed: PT screened, no needs identified, will sign off; Patient expresses frustrations about having to participate with PT. Patient jumped out of bed and hopped/ran around the room and got back into bed. Patient without therapy needs at this time.  11:27 AM, 07/06/21 Mearl Latin PT, DPT Physical Therapist at [facility]

## 2021-07-06 NOTE — Progress Notes (Signed)
PROGRESS NOTE    Francisco Robinson  TDS:[id number] DOB: [birth year] DOA: 07/05/2021 PCP: Rosita Fire, MD    Brief Narrative:  60 year old male with a history of COPD, alcohol use, diabetes, admitted to the hospital with shortness of breath.  X-ray indicated developing pneumonia.  He was started on antibiotics.  Also noted to have signs of alcohol withdrawal and was treated with Ativan.   Assessment & Plan:   Principal Problem:   Pneumonia Active Problems:   Alcohol abuse   COPD (chronic obstructive pulmonary disease) (HCC)   History of stroke   Diabetes mellitus without complication (HCC)  Community-acquired pneumonia -Admitted with fever and shortness of breath -Currently on ceftriaxone and azithromycin -Follow-up cultures  COPD -Currently on steroids, antibiotics and bronchodilators -Overall respiratory status appears to be improving  Acute respiratory failure with hypoxia -Required BiPAP on admission -Has since been weaned down to room air  Acute alcohol withdrawal -Continues to have tremors -Reports his last drink was on Thanksgiving day -Continue on CIWA protocol with Ativan -Reports that he has taken part in a residential alcohol rehab program in the past, but is not interested at this time.  Chronic combined CHF -Ejection fraction of 30 to 35% on last echocardiogram -Blood pressures are borderline low, limiting therapy of beta-blockers and ARB's -He is on Lasix -We will likely discuss outpatient management with cardiology  History of CVA -Continue on aspirin and statin    DVT prophylaxis: heparin injection 5,000 Units Start: 07/05/21 2200 SCDs Start: 07/05/21 1758  Code Status: Full code Family Communication: Discussed with patient Disposition Plan: Status is: Inpatient  Remains inpatient appropriate because: Continue treatment of pneumonia and alcohol withdrawal         Consultants:    Procedures:    Antimicrobials:  Ceftriaxone  11/27 > Azithromycin 11/27 >   Subjective: He says he feeling better today.  Overall respiratory status is improving.  He is feeling shaky.  Objective: Vitals:   07/06/21 0752 07/06/21 0844 07/06/21 1324 07/06/21 1357  BP:  103/80 100/68   Pulse:  100 99   Resp:  20 20   Temp:  98.9 F (37.2 C) 97.9 F (36.6 C)   TempSrc:  Oral    SpO2: 95% 98% 100% 93%  Weight:      Height:        Intake/Output Summary (Last 24 hours) at 07/06/2021 1735 Last data filed at 07/06/2021 1700 Gross per 24 hour  Intake 1130.11 ml  Output 300 ml  Net 830.11 ml   Filed Weights   07/05/21 1023  Weight: 54.4 kg    Examination:  General exam: Appears calm and comfortable  Respiratory system: Clear to auscultation. Respiratory effort normal. Cardiovascular system: S1 & S2 heard, RRR. No JVD, murmurs, rubs, gallops or clicks. No pedal edema. Gastrointestinal system: Abdomen is nondistended, soft and nontender. No organomegaly or masses felt. Normal bowel sounds heard. Central nervous system: Alert and oriented. No focal neurological deficits. Mildly tremulous Extremities: Symmetric 5 x 5 power. Skin: No rashes, lesions or ulcers Psychiatry: Judgement and insight appear normal. Mood & affect appropriate.     Data Reviewed: I have personally reviewed following labs and imaging studies  CBC: Recent Labs  Lab 07/05/21 1048 07/06/21 0300  WBC 12.7* 14.9*  NEUTROABS 10.0*  --   HGB 16.2 16.0  HCT 49.0 48.3  MCV 101.9* 101.0*  PLT 225 262   Basic Metabolic Panel: Recent Labs  Lab 07/05/21 1132 07/06/21 0300  NA 137 137  K 4.4 3.7  CL 107 104  CO2 20* 22  GLUCOSE 112* 180*  BUN 8 12  CREATININE 0.74 0.81  CALCIUM 9.0 9.0   GFR: Estimated Creatinine Clearance: 75.6 mL/min (by C-G formula based on SCr of 0.81 mg/dL). Liver Function Tests: Recent Labs  Lab 07/05/21 1132  AST 122*  ALT 57*  ALKPHOS 106  BILITOT 0.8  PROT 6.8  ALBUMIN 3.3*   No results for input(s):  LIPASE, AMYLASE in the last 168 hours. No results for input(s): AMMONIA in the last 168 hours. Coagulation Profile: No results for input(s): INR, PROTIME in the last 168 hours. Cardiac Enzymes: No results for input(s): CKTOTAL, CKMB, CKMBINDEX, TROPONINI in the last 168 hours. BNP (last 3 results) No results for input(s): PROBNP in the last 8760 hours. HbA1C: No results for input(s): HGBA1C in the last 72 hours. CBG: No results for input(s): GLUCAP in the last 168 hours. Lipid Profile: No results for input(s): CHOL, HDL, LDLCALC, TRIG, CHOLHDL, LDLDIRECT in the last 72 hours. Thyroid Function Tests: No results for input(s): TSH, T4TOTAL, FREET4, T3FREE, THYROIDAB in the last 72 hours. Anemia Panel: No results for input(s): VITAMINB12, FOLATE, FERRITIN, TIBC, IRON, RETICCTPCT in the last 72 hours. Sepsis Labs: Recent Labs  Lab 07/05/21 1101 07/05/21 1450  LATICACIDVEN 1.5 1.8    Recent Results (from the past 240 hour(s))  Resp Panel by RT-PCR (Flu A&B, Covid) Nasopharyngeal Swab     Status: None   Collection Time: 07/05/21 10:45 AM   Specimen: Nasopharyngeal Swab; Nasopharyngeal(NP) swabs in vial transport medium  Result Value Ref Range Status   SARS Coronavirus 2 by RT PCR NEGATIVE NEGATIVE Final    Comment: (NOTE) SARS-CoV-2 target nucleic acids are NOT DETECTED.  The SARS-CoV-2 RNA is generally detectable in upper respiratory specimens during the acute phase of infection. The lowest concentration of SARS-CoV-2 viral copies this assay can detect is 138 copies/mL. A negative result does not preclude SARS-Cov-2 infection and should not be used as the sole basis for treatment or other patient management decisions. A negative result may occur with  improper specimen collection/handling, submission of specimen other than nasopharyngeal swab, presence of viral mutation(s) within the areas targeted by this assay, and inadequate number of viral copies(<138 copies/mL). A negative  result must be combined with clinical observations, patient history, and epidemiological information. The expected result is Negative.  Fact Sheet for Patients:  EntrepreneurPulse.com.au  Fact Sheet for Healthcare Providers:  IncredibleEmployment.be  This test is no t yet approved or cleared by the [place] FDA and  has been authorized for detection and/or diagnosis of SARS-CoV-2 by FDA under an Emergency Use Authorization (EUA). This EUA will remain  in effect (meaning this test can be used) for the duration of the COVID-19 declaration under Section 564(b)(1) of the Act, 21 U.S.C.section 360bbb-3(b)(1), unless the authorization is terminated  or revoked sooner.       Influenza A by PCR NEGATIVE NEGATIVE Final   Influenza B by PCR NEGATIVE NEGATIVE Final    Comment: (NOTE) The Xpert Xpress SARS-CoV-2/FLU/RSV plus assay is intended as an aid in the diagnosis of influenza from Nasopharyngeal swab specimens and should not be used as a sole basis for treatment. Nasal washings and aspirates are unacceptable for Xpert Xpress SARS-CoV-2/FLU/RSV testing.  Fact Sheet for Patients: EntrepreneurPulse.com.au  Fact Sheet for Healthcare Providers: IncredibleEmployment.be  This test is not yet approved or cleared by the [place] FDA and has been authorized for detection and/or diagnosis of SARS-CoV-2  by FDA under an Emergency Use Authorization (EUA). This EUA will remain in effect (meaning this test can be used) for the duration of the COVID-19 declaration under Section 564(b)(1) of the Act, 21 U.S.C. section 360bbb-3(b)(1), unless the authorization is terminated or revoked.  Performed at [facility], [address] [place], [place] [address]   Culture, blood (routine x 2)     Status: None (Preliminary result)   Collection Time: 07/05/21 11:01 AM   Specimen: Right Antecubital; Blood  Result Value Ref  Range Status   Specimen Description   Final    RIGHT ANTECUBITAL BOTTLES DRAWN AEROBIC AND ANAEROBIC   Special Requests Blood Culture adequate volume  Final   Culture   Final    NO GROWTH 1 DAY Performed at [facility], [address], [place], [place] [address]    Report Status PENDING  Incomplete  Culture, blood (routine x 2)     Status: None (Preliminary result)   Collection Time: 07/05/21 11:32 AM   Specimen: Left Antecubital; Blood  Result Value Ref Range Status   Specimen Description LEFT ANTECUBITAL BOTTLES DRAWN AEROBIC ONLY  Final   Special Requests   Final    Blood Culture results may not be optimal due to an inadequate volume of blood received in culture bottles   Culture   Final    NO GROWTH 1 DAY Performed at [facility], [address] [place], [place] [address]    Report Status PENDING  Incomplete  MRSA Next Gen by PCR, Nasal     Status: None   Collection Time: 07/06/21  3:00 AM  Result Value Ref Range Status   MRSA by PCR Next Gen NOT DETECTED NOT DETECTED Final    Comment: (NOTE) The GeneXpert MRSA Assay (FDA approved for NASAL specimens only), is one component of a comprehensive MRSA colonization surveillance program. It is not intended to diagnose MRSA infection nor to guide or monitor treatment for MRSA infections. Test performance is not FDA approved in patients less than 88 years old. Performed at [facility], [address], [place], [place] [address]          Radiology Studies: DG Chest Portable 1 View  Result Date: 07/05/2021 CLINICAL DATA:  Shortness of breath. EXAM: PORTABLE CHEST 1 VIEW COMPARISON:  06/16/2021 prior studies FINDINGS: The cardiomediastinal silhouette is unremarkable. New RIGHT UPPER lung airspace disease is noted likely representing pneumonia. Mild interstitial opacities bilaterally are again identified. There is no evidence of pneumothorax or pleural effusion. No acute bony abnormalities are present. IMPRESSION: New RIGHT UPPER  lung airspace disease likely representing pneumonia. Unchanged bilateral interstitial opacities. Electronically Signed   By: Margarette Canada M.D.   On: 07/05/2021 11:07        Scheduled Meds:  aspirin EC  81 mg Oral Q breakfast   atorvastatin  40 mg Oral Daily   Chlorhexidine Gluconate Cloth  6 each Topical Daily   folic acid  1 mg Oral Daily   furosemide  40 mg Intravenous Once   furosemide  20 mg Oral Daily   guaiFENesin  600 mg Oral BID   heparin  5,000 Units Subcutaneous Q8H   ipratropium-albuterol  3 mL Nebulization Q6H   multivitamin with minerals  1 tablet Oral Daily   nicotine  21 mg Transdermal Daily   predniSONE  40 mg Oral Q breakfast   thiamine  100 mg Oral Daily   Or   thiamine  100 mg Intravenous Daily   Continuous Infusions:  azithromycin     cefTRIAXone (ROCEPHIN)  IV 2 g (07/06/21 1227)     LOS: 1 day    Time spent: 52mins    Kathie Dike, MD [facility]   If 7PM-7AM, please contact night-coverage [email or web address]  07/06/2021, 5:35 PM

## 2021-07-07 DIAGNOSIS — I429 Cardiomyopathy, unspecified: Secondary | ICD-10-CM

## 2021-07-07 DIAGNOSIS — J189 Pneumonia, unspecified organism: Secondary | ICD-10-CM | POA: Diagnosis not present

## 2021-07-07 DIAGNOSIS — F1010 Alcohol abuse, uncomplicated: Secondary | ICD-10-CM | POA: Diagnosis not present

## 2021-07-07 MED ORDER — THIAMINE HCL 100 MG PO TABS: 100.0000 mg | ORAL_TABLET | Freq: Every day | ORAL | 1 refills | Status: DC

## 2021-07-07 MED ORDER — FOLIC ACID 1 MG PO TABS: 1.0000 mg | ORAL_TABLET | Freq: Every day | ORAL | 1 refills | Status: DC

## 2021-07-07 MED ORDER — PREDNISONE 10 MG PO TABS: ORAL_TABLET | ORAL | 0 refills | Status: DC

## 2021-07-07 MED ORDER — ALBUTEROL SULFATE HFA 108 (90 BASE) MCG/ACT IN AERS
2.0000 | INHALATION_SPRAY | RESPIRATORY_TRACT | Status: DC
  Filled 2021-07-07: qty 6.7

## 2021-07-07 MED ORDER — FLUTICASONE FUROATE-VILANTEROL 100-25 MCG/ACT IN AEPB: 1.0000 | INHALATION_SPRAY | Freq: Every day | RESPIRATORY_TRACT | 1 refills | Status: DC

## 2021-07-07 MED ORDER — CEFADROXIL 500 MG PO CAPS: 500.0000 mg | ORAL_CAPSULE | Freq: Two times a day (BID) | ORAL | 0 refills | Status: DC

## 2021-07-07 MED ORDER — GUAIFENESIN ER 600 MG PO TB12: 600.0000 mg | ORAL_TABLET | Freq: Two times a day (BID) | ORAL | 0 refills | Status: DC

## 2021-07-07 MED ORDER — AZITHROMYCIN 500 MG PO TABS: 500.0000 mg | ORAL_TABLET | Freq: Every day | ORAL | 0 refills | Status: AC

## 2021-07-07 MED ORDER — LORAZEPAM 1 MG PO TABS: 1.0000 mg | ORAL_TABLET | Freq: Four times a day (QID) | ORAL | 0 refills | Status: DC | PRN

## 2021-07-07 NOTE — TOC Transition Note (Signed)
Transition of Care [facility]) - CM/SW Discharge Note   Patient Details  Name: Francisco Robinson MRN: [id number] Date of Birth: [birth year]  Transition of Care [facility]) CM/SW Contact:  Boneta Lucks, RN Phone Number: 07/07/2021, 10:24 AM   Clinical Narrative:   Patient admitted with pneumonia. TOC consulted for substance abuse. Patient refused on 11/7. Per MD patient is refusing any resources this admission. Patient wanting to leave AMA, patient also asking for a ride home. RN will assist with transportation home if needed.    Final next level of care: Home/Self Care Barriers to Discharge: No Barriers Identified   Patient Goals and CMS Choice     Discharge Placement          Patient and family notified of of transfer: 07/07/21

## 2021-07-07 NOTE — Progress Notes (Signed)
Pt refusing tele. On call doc shaloub notified and acknowledged. No new orders

## 2021-07-07 NOTE — Discharge Summary (Signed)
Physician Discharge Summary  Francisco Robinson RDE:[id number] DOB: 1960/09/19 DOA: 07/05/2021  PCP: Rosita Fire, MD  Admit date: 07/05/2021 Discharge date: 07/07/2021  Admitted From: Home Disposition: Home   Recommendations for Outpatient Follow-up:  Follow up with PCP in 1-2 weeks Please obtain BMP/CBC in one week Outpatient referral to cardiology has been made for further work-up of cardiomyopathy Repeat chest x-ray in 3 to 4 weeks to ensure resolution pneumonia  Home Health: Equipment/Devices:  Discharge Condition: Stable CODE STATUS: Full code Diet recommendation: Heart healthy  Brief/Interim Summary: 60 year old male with a history of COPD, alcohol use, diabetes, admitted to the hospital with shortness of breath.  Chest x-ray indicated developing pneumonia.  He was started on antibiotics.  Also noted to have signs of alcohol withdrawal and was treated with Ativan.  Discharge Diagnoses:  Principal Problem:   Pneumonia Active Problems:   Alcohol abuse   COPD (chronic obstructive pulmonary disease) (HCC)   History of stroke   Diabetes mellitus without complication ([place])   Cardiomyopathy ([place])  Community-acquired pneumonia -Admitted with fever and shortness of breath -Currently on ceftriaxone and azithromycin -Overall respiratory status has improved and has been transitioned to oral antibiotics   COPD -Currently on steroids, antibiotics and bronchodilators -Transition to prednisone taper, continue bronchodilators   Acute respiratory failure with hypoxia -Required BiPAP on admission -Has since been weaned down to room air   Acute alcohol withdrawal -Patient was treated with Ativan per [facility] protocol -Continues to require Ativan on the day of discharge -Insistent that he wants to go home today -He is alert and oriented, and can identify potential risks of going home too early from a withdrawal standpoint -He insist that he will not be consuming alcohol on  returning home -I offered him alcohol rehab programs/resources, but he reported he is not interested at this time.   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 -He is noted to have wall motion abnormalities on echo, implying component of ischemic cardiomyopathy -Troponins were negative on last admission as well as this admission, making acute MI unlikely -Large issue with further work-up/treatment of cardiomyopathy will be patient compliance -Referral has been made for outpatient cardiology work-up   History of CVA -Continue on aspirin and statin  Discharge Instructions  Discharge Instructions     Diet - low sodium heart healthy   Complete by: As directed    Increase activity slowly   Complete by: As directed       Allergies as of 07/07/2021   No Known Allergies      Medication List     STOP taking these medications    aspirin 81 MG EC tablet       TAKE these medications    albuterol 108 (90 Base) MCG/ACT inhaler Commonly known as: VENTOLIN HFA Inhale 2 puffs into the lungs every 6 (six) hours as needed for wheezing or shortness of breath.   atorvastatin 40 MG tablet Commonly known as: LIPITOR Take 1 tablet (40 mg total) by mouth daily.   azithromycin 500 MG tablet Commonly known as: Zithromax Take 1 tablet (500 mg total) by mouth daily for 3 days. Take 1 tablet daily for 3 days.   cefadroxil 500 MG capsule Commonly known as: DURICEF Take 1 capsule (500 mg total) by mouth 2 (two) times  daily.   fluticasone furoate-vilanterol 100-25 MCG/ACT Aepb Commonly known as: BREO ELLIPTA Inhale 1 puff into the lungs daily.   folic acid 1 MG tablet Commonly known as: FOLVITE Take 1 tablet (1 mg total) by mouth  daily. Start taking on: July 08, 2021   furosemide 20 MG tablet Commonly known as: LASIX Take 1 tablet (20 mg total) by mouth daily.   guaiFENesin 600 MG 12 hr tablet Commonly known as: MUCINEX Take 1 tablet (600 mg total) by mouth 2 (two) times daily.   LORazepam 1 MG tablet Commonly known as: Ativan Take 1 tablet (1 mg total) by mouth every 6 (six) hours as needed for anxiety.   nicotine 21 mg/24hr patch Commonly known as: NICODERM CQ - dosed in mg/24 hours Place 1 patch (21 mg total) onto the skin daily.   predniSONE 10 MG tablet Commonly known as: DELTASONE Take 40mg  po daily for 2 days then 30mg  daily for 2 days then 20mg  daily for 2 days then 10mg  daily for 2 days then stop   thiamine 100 MG tablet Take 1 tablet (100 mg total) by mouth daily. Start taking on: July 08, 2021        No Known Allergies  Consultations:    Procedures/Studies: DG Chest 2 View  Result Date: 06/16/2021 CLINICAL DATA:  Shortness of breath, cough EXAM: CHEST - 2 VIEW COMPARISON:  06/14/2021 FINDINGS: Transverse diameter of heart is slightly increased. Pulmonary vascularity is unremarkable. There is interval clearing of increased interstitial markings in both lungs, more so on the right side suggesting interval resolution of pneumonitis or asymmetric pulmonary edema. There are no new infiltrates. There is no pleural effusion or pneumothorax. IMPRESSION: Interval resolution of diffuse interstitial edema/interstitial pneumonia since 06/14/2021. There are no new infiltrates. There is no pleural effusion or pneumothorax. Electronically Signed   By: Elmer Picker M.D.   On: 06/16/2021 09:56   DG Chest Portable 1 View  Result Date: 07/05/2021 CLINICAL DATA:  Shortness of breath. EXAM: PORTABLE CHEST 1 VIEW COMPARISON:  06/16/2021 prior studies FINDINGS: The cardiomediastinal silhouette is unremarkable. New RIGHT UPPER lung airspace disease is noted likely representing pneumonia. Mild  interstitial opacities bilaterally are again identified. There is no evidence of pneumothorax or pleural effusion. No acute bony abnormalities are present. IMPRESSION: New RIGHT UPPER lung airspace disease likely representing pneumonia. Unchanged bilateral interstitial opacities. Electronically Signed   By: Margarette Canada M.D.   On: 07/05/2021 11:07   DG Chest Port 1 View  Result Date: 06/14/2021 CLINICAL DATA:  60 year old male with shortness of breath EXAM: PORTABLE CHEST 1 VIEW COMPARISON:  01/27/2016 FINDINGS: Cardiomediastinal silhouette unchanged in size and contour. Interval development of reticulonodular opacities, asymmetrically of the right greater than left lungs with interlobular septal thickening. Emphysematous changes again noted. No pneumothorax or pleural effusion. No displaced fracture. IMPRESSION: New asymmetric reticulonodular opacities of the right greater than left lungs with interlobular septal thickening. Atypical infection is favored, although early asymmetric edema could have this appearance. Electronically Signed   By: Corrie Mckusick D.O.   On: 06/14/2021 14:02   ECHOCARDIOGRAM COMPLETE  Result Date: 06/16/2021    ECHOCARDIOGRAM REPORT   Patient Name:   BRENNYN HAISLEY Date of Exam: 06/16/2021 Medical Rec #:  [id number]        Height:       66.0 in Accession #:    [id number]       Weight:       113.5 lb Date of Birth:  1961/06/17  BSA:          1.572 m Patient Age:    78 years         BP:           90/68 mmHg Patient Gender: M                HR:           98 bpm. Exam Location:  [facility] Procedure: 2D Echo, Cardiac Doppler and Color Doppler Indications:    Congestive Heart Failure  History:        Patient has no prior history of Echocardiogram examinations.                 Previous Myocardial Infarction, COPD and Stroke; Risk                 Factors:Diabetes. Alcohol abuse.  Sonographer:    Wenda Low Referring Phys: [place]  1. Left ventricular  ejection fraction, by estimation, is 30 to 35%. The left ventricle has moderately decreased function. The left ventricle demonstrates regional wall motion abnormalities (suggestive of prior RCA infact). There is mild concentric left ventricular hypertrophy. Left ventricular diastolic parameters are consistent with Grade I diastolic dysfunction (impaired relaxation).  2. Right ventricular systolic function is normal. The right ventricular size is normal. Tricuspid regurgitation signal is inadequate for assessing PA pressure.  3. The mitral valve is grossly normal. Trivial mitral valve regurgitation. No evidence of mitral stenosis.  4. The aortic valve was not well visualized but calcified greater than expected for age. Cannot excluded bicuspid valve. There is moderate calcification of the aortic valve. Aortic valve regurgitation is not visualized. Mild to moderate aortic valve sclerosis/calcification is present, without any evidence of aortic stenosis. Aortic valve mean gradient measures 11.0 mmHg. Aortic valve Vmax measures 2.22 m/s. Comparison(s): No prior Echocardiogram. FINDINGS  Left Ventricle: Left ventricular ejection fraction, by estimation, is 30 to 35%. The left ventricle has moderately decreased function. The left ventricle demonstrates regional wall motion abnormalities. The left ventricular internal cavity size was normal in size. There is mild concentric left ventricular hypertrophy. Left ventricular diastolic parameters are consistent with Grade I diastolic dysfunction (impaired relaxation).  LV Wall Scoring: The mid and distal lateral wall, entire inferior wall, posterior wall, mid anterolateral segment, mid inferoseptal segment, and basal inferoseptal segment are hypokinetic. Right Ventricle: The right ventricular size is normal. No increase in right ventricular wall thickness. Right ventricular systolic function is normal. Tricuspid regurgitation signal is inadequate for assessing PA pressure. Left  Atrium: Left atrial size was normal in size. Right Atrium: Right atrial size was normal in size. Pericardium: There is no evidence of pericardial effusion. Mitral Valve: The mitral valve is grossly normal. Mild mitral annular calcification. Trivial mitral valve regurgitation. No evidence of mitral valve stenosis. MV peak gradient, 3.2 mmHg. The mean mitral valve gradient is 2.0 mmHg. Tricuspid Valve: The tricuspid valve is normal in structure. Tricuspid valve regurgitation is not demonstrated. Aortic Valve: The aortic valve was not well visualized. There is moderate calcification of the aortic valve. Aortic valve regurgitation is not visualized. Mild to moderate aortic valve sclerosis/calcification is present, without any evidence of aortic stenosis. Aortic valve mean gradient measures 11.0 mmHg. Aortic valve peak gradient measures 19.8 mmHg. Aortic valve area, by VTI measures 1.55 cm. Pulmonic Valve: The pulmonic valve was grossly normal. Pulmonic valve regurgitation is trivial. No evidence of pulmonic stenosis. Aorta: The aortic root and ascending aorta are  structurally normal, with no evidence of dilitation. IAS/Shunts: The atrial septum is grossly normal.  LEFT VENTRICLE PLAX 2D LVIDd:         5.20 cm      Diastology LVIDs:         4.30 cm      LV e' medial:    6.85 cm/s LV PW:         1.20 cm      LV E/e' medial:  12.5 LV IVS:        1.20 cm      LV e' lateral:   8.70 cm/s LVOT diam:     2.20 cm      LV E/e' lateral: 9.8 LV SV:         61 LV SV Index:   39 LVOT Area:     3.80 cm  LV Volumes (MOD) LV vol d, MOD A2C: 101.0 ml LV vol d, MOD A4C: 92.4 ml LV vol s, MOD A2C: 55.9 ml LV vol s, MOD A4C: 68.7 ml LV SV MOD A2C:     45.1 ml LV SV MOD A4C:     92.4 ml LV SV MOD BP:      37.5 ml RIGHT VENTRICLE RV Basal diam:  3.30 cm RV Mid diam:    2.60 cm RV S prime:     18.10 cm/s TAPSE (M-mode): 1.6 cm LEFT ATRIUM             Index        RIGHT ATRIUM           Index LA diam:        3.40 cm 2.16 cm/m   RA Area:      15.10 cm LA Vol (A2C):   42.2 ml 26.84 ml/m  RA Volume:   42.00 ml  26.72 ml/m LA Vol (A4C):   41.8 ml 26.59 ml/m LA Biplane Vol: 43.5 ml 27.67 ml/m  AORTIC VALVE                     PULMONIC VALVE AV Area (Vmax):    1.57 cm      PV Vmax:       0.79 m/s AV Area (Vmean):   1.68 cm      PV Peak grad:  2.5 mmHg AV Area (VTI):     1.55 cm AV Vmax:           222.50 cm/s AV Vmean:          150.500 cm/s AV VTI:            0.395 m AV Peak Grad:      19.8 mmHg AV Mean Grad:      11.0 mmHg LVOT Vmax:         92.00 cm/s LVOT Vmean:        66.500 cm/s LVOT VTI:          0.161 m LVOT/AV VTI ratio: 0.41  AORTA Ao Root diam: 3.30 cm MITRAL VALVE MV Area (PHT): 5.62 cm    SHUNTS MV Area VTI:   3.12 cm    Systemic VTI:  0.16 m MV Peak grad:  3.2 mmHg    Systemic Diam: 2.20 cm MV Mean grad:  2.0 mmHg MV Vmax:       0.90 m/s MV Vmean:      60.1 cm/s MV Decel Time: 135 msec MV E velocity: 85.30 cm/s MV A velocity: 46.70 cm/s MV E/A ratio:  1.83 Rudean Haskell MD Electronically signed by Rudean Haskell MD Signature Date/Time: 06/16/2021/1:52:44 PM    Final       Subjective: Reports that breathing is better.  No shortness of breath.  Very insistent on being discharged home today.  Discharge Exam: Vitals:   07/06/21 1944 07/06/21 2200 07/07/21 0519 07/07/21 0700  BP: 101/81  96/76 100/79  Pulse: (!) 116 99 92 100  Resp: 20  20   Temp: 98 F (36.7 C)  98.3 F (36.8 C)   TempSrc:   Oral   SpO2: 98%  100%   Weight:      Height:        General: Pt is alert, awake, not in acute distress Cardiovascular: RRR, S1/S2 +, no rubs, no gallops Respiratory: CTA bilaterally, no wheezing, no rhonchi Abdominal: Soft, NT, ND, bowel sounds + Extremities: no edema, no cyanosis    The results of significant diagnostics from this hospitalization (including imaging, microbiology, ancillary and laboratory) are listed below for reference.     Microbiology: Recent Results (from the past 240 hour(s))  Resp  Panel by RT-PCR (Flu A&B, Covid) Nasopharyngeal Swab     Status: None   Collection Time: 07/05/21 10:45 AM   Specimen: Nasopharyngeal Swab; Nasopharyngeal(NP) swabs in vial transport medium  Result Value Ref Range Status   SARS Coronavirus 2 by RT PCR NEGATIVE NEGATIVE Final    Comment: (NOTE) SARS-CoV-2 target nucleic acids are NOT DETECTED.  The SARS-CoV-2 RNA is generally detectable in upper respiratory specimens during the acute phase of infection. The lowest concentration of SARS-CoV-2 viral copies this assay can detect is 138 copies/mL. A negative result does not preclude SARS-Cov-2 infection and should not be used as the sole basis for treatment or other patient management decisions. A negative result may occur with  improper specimen collection/handling, submission of specimen other than nasopharyngeal swab, presence of viral mutation(s) within the areas targeted by this assay, and inadequate number of viral copies(<138 copies/mL). A negative result must be combined with clinical observations, patient history, and epidemiological information. The expected result is Negative.  Fact Sheet for Patients:  EntrepreneurPulse.com.au  Fact Sheet for Healthcare Providers:  IncredibleEmployment.be  This test is no t yet approved or cleared by the [place] FDA and  has been authorized for detection and/or diagnosis of SARS-CoV-2 by FDA under an Emergency Use Authorization (EUA). This EUA will remain  in effect (meaning this test can be used) for the duration of the COVID-19 declaration under Section 564(b)(1) of the Act, 21 U.S.C.section 360bbb-3(b)(1), unless the authorization is terminated  or revoked sooner.       Influenza A by PCR NEGATIVE NEGATIVE Final   Influenza B by PCR NEGATIVE NEGATIVE Final    Comment: (NOTE) The Xpert Xpress SARS-CoV-2/FLU/RSV plus assay is intended as an aid in the diagnosis of influenza from Nasopharyngeal  swab specimens and should not be used as a sole basis for treatment. Nasal washings and aspirates are unacceptable for Xpert Xpress SARS-CoV-2/FLU/RSV testing.  Fact Sheet for Patients: EntrepreneurPulse.com.au  Fact Sheet for Healthcare Providers: IncredibleEmployment.be  This test is not yet approved or cleared by the [place] FDA and has been authorized for detection and/or diagnosis of SARS-CoV-2 by FDA under an Emergency Use Authorization (EUA). This EUA will remain in effect (meaning this test can be used) for the duration of the COVID-19 declaration under Section 564(b)(1) of the Act, 21 U.S.C. section 360bbb-3(b)(1), unless the authorization is terminated or revoked.  Performed at [facility]  [facility], [address], [place], [place] [address]   Culture, blood (routine x 2)     Status: None (Preliminary result)   Collection Time: 07/05/21 11:01 AM   Specimen: Right Antecubital; Blood  Result Value Ref Range Status   Specimen Description   Final    RIGHT ANTECUBITAL BOTTLES DRAWN AEROBIC AND ANAEROBIC   Special Requests Blood Culture adequate volume  Final   Culture   Final    NO GROWTH 2 DAYS Performed at [facility], [address] [place], [place] [address]    Report Status PENDING  Incomplete  Culture, blood (routine x 2)     Status: None (Preliminary result)   Collection Time: 07/05/21 11:32 AM   Specimen: Left Antecubital; Blood  Result Value Ref Range Status   Specimen Description LEFT ANTECUBITAL BOTTLES DRAWN AEROBIC ONLY  Final   Special Requests   Final    Blood Culture results may not be optimal due to an inadequate volume of blood received in culture bottles   Culture   Final    NO GROWTH 2 DAYS Performed at [facility], [address] [place], [place] [address]    Report Status PENDING  Incomplete  MRSA Next Gen by PCR, Nasal     Status: None   Collection Time: 07/06/21  3:00 AM  Result Value Ref Range Status    MRSA by PCR Next Gen NOT DETECTED NOT DETECTED Final    Comment: (NOTE) The GeneXpert MRSA Assay (FDA approved for NASAL specimens only), is one component of a comprehensive MRSA colonization surveillance program. It is not intended to diagnose MRSA infection nor to guide or monitor treatment for MRSA infections. Test performance is not FDA approved in patients less than 93 years old. Performed at [facility], [address], [place], [place] [address]      Labs: BNP (last 3 results) Recent Labs    06/14/21 1250 07/05/21 1048  BNP 2,838.0* 7,616.0*   Basic Metabolic Panel: Recent Labs  Lab 07/05/21 1132 07/06/21 0300  NA 137 137  K 4.4 3.7  CL 107 104  CO2 20* 22  GLUCOSE 112* 180*  BUN 8 12  CREATININE 0.74 0.81  CALCIUM 9.0 9.0   Liver Function Tests: Recent Labs  Lab 07/05/21 1132  AST 122*  ALT 57*  ALKPHOS 106  BILITOT 0.8  PROT 6.8  ALBUMIN 3.3*   No results for input(s): LIPASE, AMYLASE in the last 168 hours. No results for input(s): AMMONIA in the last 168 hours. CBC: Recent Labs  Lab 07/05/21 1048 07/06/21 0300  WBC 12.7* 14.9*  NEUTROABS 10.0*  --   HGB 16.2 16.0  HCT 49.0 48.3  MCV 101.9* 101.0*  PLT 225 197   Cardiac Enzymes: No results for input(s): CKTOTAL, CKMB, CKMBINDEX, TROPONINI in the last 168 hours. BNP: Invalid input(s): POCBNP CBG: No results for input(s): GLUCAP in the last 168 hours. D-Dimer No results for input(s): DDIMER in the last 72 hours. Hgb A1c No results for input(s): HGBA1C in the last 72 hours. Lipid Profile No results for input(s): CHOL, HDL, LDLCALC, TRIG, CHOLHDL, LDLDIRECT in the last 72 hours. Thyroid function studies No results for input(s): TSH, T4TOTAL, T3FREE, THYROIDAB in the last 72 hours.  Invalid input(s): FREET3 Anemia work up No results for input(s): VITAMINB12, FOLATE, FERRITIN, TIBC, IRON, RETICCTPCT in the last 72 hours. Urinalysis    Component Value Date/Time   COLORURINE YELLOW  07/05/2021 [place] 07/05/2021 1634   LABSPEC  1.020 07/05/2021 1634   PHURINE 6.0 07/05/2021 1634   GLUCOSEU NEGATIVE 07/05/2021 [place] 07/05/2021 [place] 07/05/2021 [place] 07/05/2021 [place] 07/05/2021 1634   NITRITE NEGATIVE 07/05/2021 [place] 07/05/2021 1634   Sepsis Labs Invalid input(s): PROCALCITONIN,  WBC,  LACTICIDVEN Microbiology Recent Results (from the past 240 hour(s))  Resp Panel by RT-PCR (Flu A&B, Covid) Nasopharyngeal Swab     Status: None   Collection Time: 07/05/21 10:45 AM   Specimen: Nasopharyngeal Swab; Nasopharyngeal(NP) swabs in vial transport medium  Result Value Ref Range Status   SARS Coronavirus 2 by RT PCR NEGATIVE NEGATIVE Final    Comment: (NOTE) SARS-CoV-2 target nucleic acids are NOT DETECTED.  The SARS-CoV-2 RNA is generally detectable in upper respiratory specimens during the acute phase of infection. The lowest concentration of SARS-CoV-2 viral copies this assay can detect is 138 copies/mL. A negative result does not preclude SARS-Cov-2 infection and should not be used as the sole basis for treatment or other patient management decisions. A negative result may occur with  improper specimen collection/handling, submission of specimen other than nasopharyngeal swab, presence of viral mutation(s) within the areas targeted by this assay, and inadequate number of viral copies(<138 copies/mL). A negative result must be combined with clinical observations, patient history, and epidemiological information. The expected result is Negative.  Fact Sheet for Patients:  EntrepreneurPulse.com.au  Fact Sheet for Healthcare Providers:  IncredibleEmployment.be  This test is no t yet approved or cleared by the [place] FDA and  has been authorized for detection and/or diagnosis of SARS-CoV-2 by FDA under an  Emergency Use Authorization (EUA). This EUA will remain  in effect (meaning this test can be used) for the duration of the COVID-19 declaration under Section 564(b)(1) of the Act, 21 U.S.C.section 360bbb-3(b)(1), unless the authorization is terminated  or revoked sooner.       Influenza A by PCR NEGATIVE NEGATIVE Final   Influenza B by PCR NEGATIVE NEGATIVE Final    Comment: (NOTE) The Xpert Xpress SARS-CoV-2/FLU/RSV plus assay is intended as an aid in the diagnosis of influenza from Nasopharyngeal swab specimens and should not be used as a sole basis for treatment. Nasal washings and aspirates are unacceptable for Xpert Xpress SARS-CoV-2/FLU/RSV testing.  Fact Sheet for Patients: EntrepreneurPulse.com.au  Fact Sheet for Healthcare Providers: IncredibleEmployment.be  This test is not yet approved or cleared by the [place] FDA and has been authorized for detection and/or diagnosis of SARS-CoV-2 by FDA under an Emergency Use Authorization (EUA). This EUA will remain in effect (meaning this test can be used) for the duration of the COVID-19 declaration under Section 564(b)(1) of the Act, 21 U.S.C. section 360bbb-3(b)(1), unless the authorization is terminated or revoked.  Performed at [facility], [address], [place], [place] [address]   Culture, blood (routine x 2)     Status: None (Preliminary result)   Collection Time: 07/05/21 11:01 AM   Specimen: Right Antecubital; Blood  Result Value Ref Range Status   Specimen Description   Final    RIGHT ANTECUBITAL BOTTLES DRAWN AEROBIC AND ANAEROBIC   Special Requests Blood Culture adequate volume  Final   Culture   Final    NO GROWTH 2 DAYS Performed at [facility], [address] [place],  [address]    Report Status PENDING  Incomplete  Culture, blood (routine x 2)     Status: None (Preliminary result)  Collection Time: 07/05/21 11:32 AM   Specimen: Left Antecubital; Blood   Result Value Ref Range Status   Specimen Description LEFT ANTECUBITAL BOTTLES DRAWN AEROBIC ONLY  Final   Special Requests   Final    Blood Culture results may not be optimal due to an inadequate volume of blood received in culture bottles   Culture   Final    NO GROWTH 2 DAYS Performed at [facility], [address] [place], [place] [address]    Report Status PENDING  Incomplete  MRSA Next Gen by PCR, Nasal     Status: None   Collection Time: 07/06/21  3:00 AM  Result Value Ref Range Status   MRSA by PCR Next Gen NOT DETECTED NOT DETECTED Final    Comment: (NOTE) The GeneXpert MRSA Assay (FDA approved for NASAL specimens only), is one component of a comprehensive MRSA colonization surveillance program. It is not intended to diagnose MRSA infection nor to guide or monitor treatment for MRSA infections. Test performance is not FDA approved in patients less than 60 years old. Performed at [facility], [address] [place], [place] [address]      Time coordinating discharge: 8mins  SIGNED:   Kathie Dike, MD  [facility] 07/07/2021, 5:21 PM   If 7PM-7AM, please contact night-coverage [email or web address]

## 2021-07-09 LAB — LEGIONELLA PNEUMOPHILA SEROGP 1 UR AG: L. pneumophila Serogp 1 Ur Ag: NEGATIVE

## 2021-07-10 LAB — CULTURE, BLOOD (ROUTINE X 2)
Culture: NO GROWTH
Culture: NO GROWTH
Special Requests: ADEQUATE

## 2021-07-26 ENCOUNTER — Other Ambulatory Visit: Payer: Self-pay

## 2021-07-26 ENCOUNTER — Encounter (HOSPITAL_COMMUNITY): Payer: Self-pay

## 2021-07-26 ENCOUNTER — Emergency Department (HOSPITAL_COMMUNITY)
Admission: EM | Admit: 2021-07-26 | Discharge: 2021-07-26 | Disposition: A | Discharge status: Home / Self Care | Payer: Medicaid Other | Attending: Emergency Medicine | Admitting: Emergency Medicine

## 2021-07-26 ENCOUNTER — Emergency Department (HOSPITAL_COMMUNITY): Payer: Medicaid Other

## 2021-07-26 DIAGNOSIS — J449 Chronic obstructive pulmonary disease, unspecified: Secondary | ICD-10-CM | POA: Diagnosis not present

## 2021-07-26 DIAGNOSIS — R0602 Shortness of breath: Secondary | ICD-10-CM | POA: Insufficient documentation

## 2021-07-26 DIAGNOSIS — E119 Type 2 diabetes mellitus without complications: Secondary | ICD-10-CM | POA: Diagnosis not present

## 2021-07-26 DIAGNOSIS — F17210 Nicotine dependence, cigarettes, uncomplicated: Secondary | ICD-10-CM | POA: Diagnosis not present

## 2021-07-26 LAB — TROPONIN I (HIGH SENSITIVITY): Troponin I (High Sensitivity): 24 ng/L — ABNORMAL HIGH (ref <18)

## 2021-07-26 LAB — COMPREHENSIVE METABOLIC PANEL
ALT: 20 U/L (ref 0–44)
AST: 16 U/L (ref 15–41)
Albumin: 3.6 g/dL (ref 3.5–5.0)
Alkaline Phosphatase: 54 U/L (ref 38–126)
Anion gap: 15 (ref 5–15)
BUN: 100 mg/dL — ABNORMAL HIGH (ref 6–20)
CO2: 21 mmol/L — ABNORMAL LOW (ref 22–32)
Calcium: 9.1 mg/dL (ref 8.9–10.3)
Chloride: 101 mmol/L (ref 98–111)
Creatinine, Ser: 7.46 mg/dL — ABNORMAL HIGH (ref 0.61–1.24)
GFR, Estimated: 8 mL/min — ABNORMAL LOW (ref >60)
Glucose, Bld: 128 mg/dL — ABNORMAL HIGH (ref 70–99)
Potassium: 4.7 mmol/L (ref 3.5–5.1)
Sodium: 137 mmol/L (ref 135–145)
Total Bilirubin: 0.3 mg/dL (ref 0.3–1.2)
Total Protein: 7.8 g/dL (ref 6.5–8.1)

## 2021-07-26 LAB — CBC WITH DIFFERENTIAL/PLATELET
Abs Immature Granulocytes: 0.02 10*3/uL (ref 0.00–0.07)
Basophils Absolute: 0 10*3/uL (ref 0.0–0.1)
Basophils Relative: 0 %
Eosinophils Absolute: 0.3 10*3/uL (ref 0.0–0.5)
Eosinophils Relative: 3 %
HCT: 33.7 % — ABNORMAL LOW (ref 39.0–52.0)
Hemoglobin: 11 g/dL — ABNORMAL LOW (ref 13.0–17.0)
Immature Granulocytes: 0 %
Lymphocytes Relative: 18 %
Lymphs Abs: 1.5 10*3/uL (ref 0.7–4.0)
MCH: 27.5 pg (ref 26.0–34.0)
MCHC: 32.6 g/dL (ref 30.0–36.0)
MCV: 84.3 fL (ref 80.0–100.0)
Monocytes Absolute: 0.7 10*3/uL (ref 0.1–1.0)
Monocytes Relative: 9 %
Neutro Abs: 5.8 10*3/uL (ref 1.7–7.7)
Neutrophils Relative %: 70 %
Platelets: 198 10*3/uL (ref 150–400)
RBC: 4 MIL/uL — ABNORMAL LOW (ref 4.22–5.81)
RDW: 13.7 % (ref 11.5–15.5)
WBC: 8.2 10*3/uL (ref 4.0–10.5)
nRBC: 0 % (ref 0.0–0.2)

## 2021-07-26 LAB — BRAIN NATRIURETIC PEPTIDE: B Natriuretic Peptide: 122 pg/mL — ABNORMAL HIGH (ref 0.0–100.0)

## 2021-07-26 MED ORDER — ALBUTEROL SULFATE HFA 108 (90 BASE) MCG/ACT IN AERS
2.0000 | INHALATION_SPRAY | Freq: Once | RESPIRATORY_TRACT | Status: AC
  Administered 2021-07-26: 2 via RESPIRATORY_TRACT
  Filled 2021-07-26: qty 6.7

## 2021-07-26 NOTE — Discharge Instructions (Signed)
You were seen in the emergency room today with shortness of breath.  You are choosing to leave [facility].  We do not have any of your test results back including blood work looking for heart attack or your chest x-ray results.  Concerned that this could be a result of your COPD but we have not evaluated for other serious conditions that could lead to you getting worse.  If certain conditions go missed or there is a delay in treatment it could lead to death.  We have discussed this in detail.  You are welcome to return to this or any other facility should you change your mind about treatment or further evaluation.  Please call [phone] if your symptoms worsen.
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
## 2021-07-26 NOTE — ED Notes (Signed)
Pt is refusing labs and IV.  States that he want to leave.  Pt did allow xray and o2 via [place] @ 2lpm.

## 2021-07-26 NOTE — ED Triage Notes (Signed)
Pt presents to ED via [facility] for SOB x 1 hour. Pt was in ICU last month for the same complaint. Pt currently 100% on RA.

## 2021-08-17 ENCOUNTER — Emergency Department (HOSPITAL_COMMUNITY): Payer: Medicaid Other

## 2021-08-17 ENCOUNTER — Other Ambulatory Visit: Payer: Self-pay

## 2021-08-17 ENCOUNTER — Encounter (HOSPITAL_COMMUNITY): Payer: Self-pay

## 2021-08-17 ENCOUNTER — Inpatient Hospital Stay (HOSPITAL_COMMUNITY)
Admission: EM | Admit: 2021-08-17 | Discharge: 2021-08-19 | DRG: 190 | Discharge status: Against Medical Advice | Payer: Medicaid Other | Attending: Internal Medicine | Admitting: Internal Medicine

## 2021-08-17 DIAGNOSIS — I5043 Acute on chronic combined systolic (congestive) and diastolic (congestive) heart failure: Secondary | ICD-10-CM | POA: Diagnosis present

## 2021-08-17 DIAGNOSIS — Z79899 Other long term (current) drug therapy: Secondary | ICD-10-CM | POA: Diagnosis not present

## 2021-08-17 DIAGNOSIS — F10231 Alcohol dependence with withdrawal delirium: Secondary | ICD-10-CM | POA: Diagnosis present

## 2021-08-17 DIAGNOSIS — F10931 Alcohol use, unspecified with withdrawal delirium: Secondary | ICD-10-CM | POA: Diagnosis present

## 2021-08-17 DIAGNOSIS — Z8673 Personal history of transient ischemic attack (TIA), and cerebral infarction without residual deficits: Secondary | ICD-10-CM | POA: Diagnosis not present

## 2021-08-17 DIAGNOSIS — Z515 Encounter for palliative care: Secondary | ICD-10-CM

## 2021-08-17 DIAGNOSIS — Z8619 Personal history of other infectious and parasitic diseases: Secondary | ICD-10-CM

## 2021-08-17 DIAGNOSIS — J439 Emphysema, unspecified: Secondary | ICD-10-CM | POA: Diagnosis present

## 2021-08-17 DIAGNOSIS — Z781 Physical restraint status: Secondary | ICD-10-CM | POA: Diagnosis not present

## 2021-08-17 DIAGNOSIS — R569 Unspecified convulsions: Secondary | ICD-10-CM | POA: Diagnosis not present

## 2021-08-17 DIAGNOSIS — I426 Alcoholic cardiomyopathy: Secondary | ICD-10-CM | POA: Diagnosis not present

## 2021-08-17 DIAGNOSIS — I252 Old myocardial infarction: Secondary | ICD-10-CM | POA: Diagnosis not present

## 2021-08-17 DIAGNOSIS — R0603 Acute respiratory distress: Secondary | ICD-10-CM

## 2021-08-17 DIAGNOSIS — G40909 Epilepsy, unspecified, not intractable, without status epilepticus: Secondary | ICD-10-CM | POA: Diagnosis present

## 2021-08-17 DIAGNOSIS — Z91199 Patient's noncompliance with other medical treatment and regimen due to unspecified reason: Secondary | ICD-10-CM

## 2021-08-17 DIAGNOSIS — J441 Chronic obstructive pulmonary disease with (acute) exacerbation: Secondary | ICD-10-CM | POA: Diagnosis not present

## 2021-08-17 DIAGNOSIS — I5021 Acute systolic (congestive) heart failure: Principal | ICD-10-CM

## 2021-08-17 DIAGNOSIS — F1010 Alcohol abuse, uncomplicated: Secondary | ICD-10-CM | POA: Diagnosis not present

## 2021-08-17 DIAGNOSIS — Z9114 Patient's other noncompliance with medication regimen: Secondary | ICD-10-CM | POA: Diagnosis not present

## 2021-08-17 DIAGNOSIS — K7030 Alcoholic cirrhosis of liver without ascites: Secondary | ICD-10-CM | POA: Diagnosis present

## 2021-08-17 DIAGNOSIS — Z91148 Patient's other noncompliance with medication regimen for other reason: Secondary | ICD-10-CM

## 2021-08-17 DIAGNOSIS — J449 Chronic obstructive pulmonary disease, unspecified: Secondary | ICD-10-CM | POA: Diagnosis present

## 2021-08-17 DIAGNOSIS — F17210 Nicotine dependence, cigarettes, uncomplicated: Secondary | ICD-10-CM | POA: Diagnosis present

## 2021-08-17 DIAGNOSIS — Z20822 Contact with and (suspected) exposure to covid-19: Secondary | ICD-10-CM | POA: Diagnosis present

## 2021-08-17 DIAGNOSIS — J431 Panlobular emphysema: Secondary | ICD-10-CM | POA: Diagnosis not present

## 2021-08-17 DIAGNOSIS — Z7189 Other specified counseling: Secondary | ICD-10-CM | POA: Diagnosis not present

## 2021-08-17 DIAGNOSIS — I429 Cardiomyopathy, unspecified: Secondary | ICD-10-CM | POA: Diagnosis present

## 2021-08-17 DIAGNOSIS — K7460 Unspecified cirrhosis of liver: Secondary | ICD-10-CM | POA: Diagnosis present

## 2021-08-17 DIAGNOSIS — E119 Type 2 diabetes mellitus without complications: Secondary | ICD-10-CM | POA: Diagnosis present

## 2021-08-17 DIAGNOSIS — Z5329 Procedure and treatment not carried out because of patient's decision for other reasons: Secondary | ICD-10-CM | POA: Diagnosis present

## 2021-08-17 LAB — CBC WITH DIFFERENTIAL/PLATELET
Abs Immature Granulocytes: 0.01 10*3/uL (ref 0.00–0.07)
Basophils Absolute: 0 10*3/uL (ref 0.0–0.1)
Basophils Relative: 1 %
Eosinophils Absolute: 0.1 10*3/uL (ref 0.0–0.5)
Eosinophils Relative: 1 %
HCT: 41.5 % (ref 39.0–52.0)
Hemoglobin: 14.1 g/dL (ref 13.0–17.0)
Immature Granulocytes: 0 %
Lymphocytes Relative: 22 %
Lymphs Abs: 1.3 10*3/uL (ref 0.7–4.0)
MCH: 33.5 pg (ref 26.0–34.0)
MCHC: 34 g/dL (ref 30.0–36.0)
MCV: 98.6 fL (ref 80.0–100.0)
Monocytes Absolute: 0.4 10*3/uL (ref 0.1–1.0)
Monocytes Relative: 7 %
Neutro Abs: 4.3 10*3/uL (ref 1.7–7.7)
Neutrophils Relative %: 69 %
Platelets: 176 10*3/uL (ref 150–400)
RBC: 4.21 MIL/uL — ABNORMAL LOW (ref 4.22–5.81)
RDW: 14.2 % (ref 11.5–15.5)
WBC: 6.2 10*3/uL (ref 4.0–10.5)
nRBC: 0 % (ref 0.0–0.2)

## 2021-08-17 LAB — BASIC METABOLIC PANEL
Anion gap: 13 (ref 5–15)
BUN: 6 mg/dL (ref 6–20)
CO2: 19 mmol/L — ABNORMAL LOW (ref 22–32)
Calcium: 8.6 mg/dL — ABNORMAL LOW (ref 8.9–10.3)
Chloride: 102 mmol/L (ref 98–111)
Creatinine, Ser: 0.72 mg/dL (ref 0.61–1.24)
GFR, Estimated: >60 mL/min (ref >60)
Glucose, Bld: 121 mg/dL — ABNORMAL HIGH (ref 70–99)
Potassium: 3.7 mmol/L (ref 3.5–5.1)
Sodium: 134 mmol/L — ABNORMAL LOW (ref 135–145)

## 2021-08-17 LAB — BLOOD GAS, VENOUS
Acid-base deficit: 5.5 mmol/L — ABNORMAL HIGH (ref 0.0–2.0)
Bicarbonate: 20.4 mmol/L (ref 20.0–28.0)
Drawn by: 4981
FIO2: 28
O2 Saturation: 88.1 %
Patient temperature: 36.8
pCO2, Ven: 28.6 mmHg — ABNORMAL LOW (ref 44.0–60.0)
pH, Ven: 7.419 (ref 7.250–7.430)
pO2, Ven: 56.2 mmHg — ABNORMAL HIGH (ref 32.0–45.0)

## 2021-08-17 LAB — ETHANOL: Alcohol, Ethyl (B): 53 mg/dL — ABNORMAL HIGH (ref <10)

## 2021-08-17 LAB — BRAIN NATRIURETIC PEPTIDE: B Natriuretic Peptide: 2475 pg/mL — ABNORMAL HIGH (ref 0.0–100.0)

## 2021-08-17 LAB — TROPONIN I (HIGH SENSITIVITY)
Troponin I (High Sensitivity): 17 ng/L (ref <18)
Troponin I (High Sensitivity): 17 ng/L (ref <18)
Troponin I (High Sensitivity): 19 ng/L — ABNORMAL HIGH (ref <18)

## 2021-08-17 LAB — RESP PANEL BY RT-PCR (FLU A&B, COVID) ARPGX2
Influenza A by PCR: NEGATIVE
Influenza B by PCR: NEGATIVE
SARS Coronavirus 2 by RT PCR: NEGATIVE

## 2021-08-17 LAB — AMMONIA: Ammonia: 27 umol/L (ref 9–35)

## 2021-08-17 LAB — TSH: TSH: 1.937 u[IU]/mL (ref 0.350–4.500)

## 2021-08-17 LAB — PROCALCITONIN: Procalcitonin: <0.1 ng/mL

## 2021-08-17 MED ORDER — FUROSEMIDE 10 MG/ML IJ SOLN
20.0000 mg | Freq: Once | INTRAMUSCULAR | Status: AC
  Administered 2021-08-17: 20 mg via INTRAVENOUS
  Filled 2021-08-17: qty 2

## 2021-08-17 MED ORDER — FOLIC ACID 1 MG PO TABS
1.0000 mg | ORAL_TABLET | Freq: Every day | ORAL | Status: DC
  Administered 2021-08-17 – 2021-08-19 (×3): 1 mg via ORAL
  Filled 2021-08-17 (×3): qty 1

## 2021-08-17 MED ORDER — LORAZEPAM 2 MG/ML IJ SOLN
0.0000 mg | Freq: Four times a day (QID) | INTRAMUSCULAR | Status: DC
  Administered 2021-08-17: 2 mg via INTRAVENOUS
  Filled 2021-08-17: qty 1

## 2021-08-17 MED ORDER — IPRATROPIUM BROMIDE 0.02 % IN SOLN
0.5000 mg | Freq: Once | RESPIRATORY_TRACT | Status: AC
  Administered 2021-08-17: 0.5 mg via RESPIRATORY_TRACT
  Filled 2021-08-17: qty 2.5

## 2021-08-17 MED ORDER — ALBUTEROL SULFATE (2.5 MG/3ML) 0.083% IN NEBU
10.0000 mg | INHALATION_SOLUTION | Freq: Once | RESPIRATORY_TRACT | Status: AC
  Administered 2021-08-17: 10 mg via RESPIRATORY_TRACT
  Filled 2021-08-17: qty 12

## 2021-08-17 MED ORDER — FLUTICASONE FUROATE-VILANTEROL 100-25 MCG/ACT IN AEPB
1.0000 | INHALATION_SPRAY | Freq: Every day | RESPIRATORY_TRACT | Status: DC
  Administered 2021-08-18 – 2021-08-19 (×2): 1 via RESPIRATORY_TRACT
  Filled 2021-08-17: qty 28

## 2021-08-17 MED ORDER — THIAMINE HCL 100 MG/ML IJ SOLN: 100.0000 mg | Freq: Every day | INTRAMUSCULAR | Status: DC

## 2021-08-17 MED ORDER — SODIUM CHLORIDE 0.9 % IV SOLN
1.0000 g | INTRAVENOUS | Status: DC
  Administered 2021-08-17 – 2021-08-18 (×2): 1 g via INTRAVENOUS
  Filled 2021-08-17 (×2): qty 10

## 2021-08-17 MED ORDER — HEPARIN SODIUM (PORCINE) 5000 UNIT/ML IJ SOLN
5000.0000 [IU] | Freq: Three times a day (TID) | INTRAMUSCULAR | Status: DC
  Administered 2021-08-17 – 2021-08-19 (×5): 5000 [IU] via SUBCUTANEOUS
  Filled 2021-08-17 (×5): qty 1

## 2021-08-17 MED ORDER — LORAZEPAM 2 MG/ML IJ SOLN
0.0000 mg | INTRAMUSCULAR | Status: DC
  Administered 2021-08-17: 21:00:00 1 mg via INTRAVENOUS
  Administered 2021-08-17 – 2021-08-18 (×5): 2 mg via INTRAVENOUS
  Administered 2021-08-18: 1 mg via INTRAVENOUS
  Administered 2021-08-18: 08:00:00 2 mg via INTRAVENOUS
  Administered 2021-08-18: 1 mg via INTRAVENOUS
  Administered 2021-08-19: 4 mg via INTRAVENOUS
  Filled 2021-08-17 (×4): qty 1
  Filled 2021-08-17: qty 2
  Filled 2021-08-17 (×5): qty 1

## 2021-08-17 MED ORDER — SODIUM CHLORIDE 0.9 % IV BOLUS
500.0000 mL | Freq: Once | INTRAVENOUS | Status: AC
  Administered 2021-08-17: 500 mL via INTRAVENOUS

## 2021-08-17 MED ORDER — ACETAMINOPHEN 650 MG RE SUPP: 650.0000 mg | Freq: Four times a day (QID) | RECTAL | Status: DC | PRN

## 2021-08-17 MED ORDER — LORAZEPAM 2 MG/ML IJ SOLN
1.0000 mg | INTRAMUSCULAR | Status: DC | PRN
  Administered 2021-08-18 (×2): 2 mg via INTRAVENOUS
  Filled 2021-08-17 (×2): qty 1

## 2021-08-17 MED ORDER — LORAZEPAM 1 MG PO TABS: 0.0000 mg | ORAL_TABLET | Freq: Four times a day (QID) | ORAL | Status: DC

## 2021-08-17 MED ORDER — IPRATROPIUM-ALBUTEROL 0.5-2.5 (3) MG/3ML IN SOLN
3.0000 mL | Freq: Four times a day (QID) | RESPIRATORY_TRACT | Status: DC
  Administered 2021-08-17 – 2021-08-18 (×3): 3 mL via RESPIRATORY_TRACT
  Filled 2021-08-17 (×3): qty 3

## 2021-08-17 MED ORDER — ADULT MULTIVITAMIN W/MINERALS CH
1.0000 | ORAL_TABLET | Freq: Every day | ORAL | Status: DC
  Administered 2021-08-17 – 2021-08-19 (×3): 1 via ORAL
  Filled 2021-08-17 (×3): qty 1

## 2021-08-17 MED ORDER — ACETAMINOPHEN 325 MG PO TABS: 650.0000 mg | ORAL_TABLET | Freq: Four times a day (QID) | ORAL | Status: DC | PRN

## 2021-08-17 MED ORDER — ATORVASTATIN CALCIUM 40 MG PO TABS
40.0000 mg | ORAL_TABLET | Freq: Every day | ORAL | Status: DC
  Administered 2021-08-17 – 2021-08-19 (×3): 40 mg via ORAL
  Filled 2021-08-17 (×3): qty 1

## 2021-08-17 MED ORDER — LORAZEPAM 1 MG PO TABS: 0.0000 mg | ORAL_TABLET | Freq: Two times a day (BID) | ORAL | Status: DC

## 2021-08-17 MED ORDER — LORAZEPAM 2 MG/ML IJ SOLN
1.0000 mg | Freq: Once | INTRAMUSCULAR | Status: AC
  Administered 2021-08-17: 1 mg via INTRAVENOUS
  Filled 2021-08-17: qty 1

## 2021-08-17 MED ORDER — GUAIFENESIN ER 600 MG PO TB12
600.0000 mg | ORAL_TABLET | Freq: Two times a day (BID) | ORAL | Status: DC
  Administered 2021-08-17 – 2021-08-19 (×5): 600 mg via ORAL
  Filled 2021-08-17 (×5): qty 1

## 2021-08-17 MED ORDER — LORAZEPAM 1 MG PO TABS
1.0000 mg | ORAL_TABLET | ORAL | Status: DC | PRN
  Administered 2021-08-18: 22:00:00 3 mg via ORAL
  Administered 2021-08-19: 1 mg via ORAL
  Filled 2021-08-17: qty 1
  Filled 2021-08-17: qty 3

## 2021-08-17 MED ORDER — LORAZEPAM 2 MG/ML IJ SOLN: 0.0000 mg | Freq: Three times a day (TID) | INTRAMUSCULAR | Status: DC

## 2021-08-17 MED ORDER — THIAMINE HCL 100 MG PO TABS
100.0000 mg | ORAL_TABLET | Freq: Every day | ORAL | Status: DC
  Administered 2021-08-18: 100 mg via ORAL
  Filled 2021-08-17: qty 1

## 2021-08-17 MED ORDER — THIAMINE HCL 100 MG PO TABS: 100.0000 mg | ORAL_TABLET | Freq: Every day | ORAL | Status: DC

## 2021-08-17 MED ORDER — LORAZEPAM 2 MG/ML IJ SOLN
2.0000 mg | Freq: Once | INTRAMUSCULAR | Status: AC
  Administered 2021-08-17: 2 mg via INTRAVENOUS
  Filled 2021-08-17: qty 1

## 2021-08-17 MED ORDER — METHYLPREDNISOLONE SODIUM SUCC 125 MG IJ SOLR
80.0000 mg | Freq: Two times a day (BID) | INTRAMUSCULAR | Status: AC
  Administered 2021-08-17 – 2021-08-18 (×2): 80 mg via INTRAVENOUS
  Filled 2021-08-17 (×2): qty 2

## 2021-08-17 MED ORDER — OXYCODONE HCL 5 MG PO TABS: 5.0000 mg | ORAL_TABLET | ORAL | Status: DC | PRN

## 2021-08-17 MED ORDER — LORAZEPAM 2 MG/ML IJ SOLN: 0.0000 mg | Freq: Two times a day (BID) | INTRAMUSCULAR | Status: DC

## 2021-08-17 MED ORDER — IPRATROPIUM BROMIDE 0.02 % IN SOLN
0.5000 mg | Freq: Four times a day (QID) | RESPIRATORY_TRACT | Status: DC
  Administered 2021-08-17: 0.5 mg via RESPIRATORY_TRACT
  Filled 2021-08-17: qty 2.5

## 2021-08-17 MED ORDER — THIAMINE HCL 100 MG/ML IJ SOLN
100.0000 mg | Freq: Every day | INTRAMUSCULAR | Status: DC
Start: 2021-08-17 — End: 2021-08-19
  Administered 2021-08-17 – 2021-08-19 (×2): 100 mg via INTRAVENOUS
  Filled 2021-08-17 (×2): qty 2

## 2021-08-17 MED ORDER — NICOTINE 21 MG/24HR TD PT24
21.0000 mg | MEDICATED_PATCH | Freq: Every day | TRANSDERMAL | Status: DC
  Administered 2021-08-17 – 2021-08-19 (×3): 21 mg via TRANSDERMAL
  Filled 2021-08-17 (×3): qty 1

## 2021-08-17 MED ORDER — ASPIRIN EC 81 MG PO TBEC
81.0000 mg | DELAYED_RELEASE_TABLET | Freq: Every day | ORAL | Status: DC
  Administered 2021-08-17 – 2021-08-19 (×3): 81 mg via ORAL
  Filled 2021-08-17 (×3): qty 1
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
## 2021-08-17 NOTE — ED Notes (Signed)
Bipap removed. Pt 98% on room air.

## 2021-08-17 NOTE — ED Provider Notes (Signed)
[facility] Provider Note   CSN: [id number] Arrival date & time: 08/17/21  1008     History  Chief Complaint  Patient presents with   Shortness of Breath    Francisco Robinson is a 61 y.o. male.  HPI     61 year old male with history of advanced COPD with emphysema, CHF comes in with chief complaint of shortness of breath.  He reports that he started feeling short of breath yesterday.  However, he has been having some cough and congestion with clear phlegm for the last few days.  Patient denies any associated fevers, chills.  He has run out of his albuterol.  Patient admits to alcoholism, reports that his cigarette consumption has gone down.  He does not have severe chest pain at this time.  Patient recently admitted to [facility] with shortness of breath.  Home Medications Prior to Admission medications   Medication Sig Start Date End Date Taking? Authorizing Provider  albuterol (VENTOLIN HFA) 108 (90 Base) MCG/ACT inhaler Inhale 2 puffs into the lungs every 6 (six) hours as needed for wheezing or shortness of breath. 06/19/21  Yes Gherghe, Vella Redhead, MD  atorvastatin (LIPITOR) 40 MG tablet Take 1 tablet (40 mg total) by mouth daily. Patient not taking: Reported on 07/06/2021 06/19/21 07/19/21  Caren Griffins, MD  cefadroxil (DURICEF) 500 MG capsule Take 1 capsule (500 mg total) by mouth 2 (two) times daily. Patient not taking: Reported on 08/17/2021 07/07/21   Kathie Dike, MD  fluticasone furoate-vilanterol (BREO ELLIPTA) 100-25 MCG/ACT AEPB Inhale 1 puff into the lungs daily. Patient not taking: Reported on 08/17/2021 07/07/21   Kathie Dike, MD  folic acid (FOLVITE) 1 MG tablet Take 1 tablet (1 mg total) by mouth daily. Patient not taking: Reported on 08/17/2021 07/08/21   Kathie Dike, MD  furosemide (LASIX) 20 MG tablet Take 1 tablet (20 mg total) by mouth daily. Patient not taking: Reported on 07/06/2021 06/19/21   Caren Griffins,  MD  guaiFENesin (MUCINEX) 600 MG 12 hr tablet Take 1 tablet (600 mg total) by mouth 2 (two) times daily. Patient not taking: Reported on 08/17/2021 07/07/21   Kathie Dike, MD  LORazepam (ATIVAN) 1 MG tablet Take 1 tablet (1 mg total) by mouth every 6 (six) hours as needed for anxiety. Patient not taking: Reported on 08/17/2021 07/07/21 07/07/22  Kathie Dike, MD  nicotine (NICODERM CQ - DOSED IN MG/24 HOURS) 21 mg/24hr patch Place 1 patch (21 mg total) onto the skin daily. Patient not taking: Reported on 07/06/2021 06/19/21   Caren Griffins, MD  predniSONE (DELTASONE) 10 MG tablet Take 40mg  po daily for 2 days then 30mg  daily for 2 days then 20mg  daily for 2 days then 10mg  daily for 2 days then stop Patient not taking: Reported on 08/17/2021 07/07/21   Kathie Dike, MD  thiamine 100 MG tablet Take 1 tablet (100 mg total) by mouth daily. Patient not taking: Reported on 08/17/2021 07/08/21   Kathie Dike, MD      Allergies    Patient has no known allergies.    Review of Systems   Review of Systems  Constitutional:  Positive for activity change.  Respiratory:  Positive for cough and shortness of breath.    Physical Exam Updated Vital Signs BP 112/86    Pulse (!) 107    Temp 98.3 F (36.8 C) (Oral)    Resp 19    Ht 5\' 7"  (1.702 m)    Wt 59  kg    SpO2 99%    BMI 20.36 kg/m  Physical Exam Vitals and nursing note reviewed.  Constitutional:      Appearance: He is well-developed.  HENT:     Head: Atraumatic.  Cardiovascular:     Rate and Rhythm: Normal rate.  Pulmonary:     Effort: Tachypnea present. No accessory muscle usage.     Breath sounds: Decreased breath sounds present.  Musculoskeletal:     Cervical back: Neck supple.     Right lower leg: No edema.     Left lower leg: No edema.  Skin:    General: Skin is warm.  Neurological:     Mental Status: He is alert and oriented to person, place, and time.    ED Results / Procedures / Treatments   Labs (all labs ordered  are listed, but only abnormal results are displayed) Labs Reviewed  BASIC METABOLIC PANEL - Abnormal; Notable for the following components:      Result Value   Sodium 134 (*)    CO2 19 (*)    Glucose, Bld 121 (*)    Calcium 8.6 (*)    All other components within normal limits  CBC WITH DIFFERENTIAL/PLATELET - Abnormal; Notable for the following components:   RBC 4.21 (*)    All other components within normal limits  BLOOD GAS, VENOUS - Abnormal; Notable for the following components:   pCO2, Ven 28.6 (*)    pO2, Ven 56.2 (*)    Acid-base deficit 5.5 (*)    All other components within normal limits  BRAIN NATRIURETIC PEPTIDE - Abnormal; Notable for the following components:   B Natriuretic Peptide 2,475.0 (*)    All other components within normal limits  ETHANOL - Abnormal; Notable for the following components:   Alcohol, Ethyl (B) 53 (*)    All other components within normal limits  RESP PANEL BY RT-PCR (FLU A&B, COVID) [id number]    EKG EKG Interpretation  Date/Time:  Sunday August 17 2021 10:32:06 EST Ventricular Rate:  107 PR Interval:  168 QRS Duration: 96 QT Interval:  338 QTC Calculation: 451 R Axis:   -21 Text Interpretation: Sinus tachycardia Borderline left axis deviation Anteroseptal infarct, old Nonspecific repol abnormality, lateral leads unchanged TWI No significant change since last tracing Confirmed by Varney Biles ([id number]) on 08/17/2021 11:54:28 AM  Radiology DG Chest Port 1 View  Result Date: 08/17/2021 CLINICAL DATA:  Shortness of breath. EXAM: PORTABLE CHEST 1 VIEW COMPARISON:  07/26/2021 FINDINGS: The lungs are clear without focal pneumonia, edema, pneumothorax or pleural effusion. Interstitial markings are diffusely coarsened with chronic features. Hyperexpansion suggests emphysema. Cardiopericardial silhouette is at upper limits of normal for size. The visualized bony structures of the thorax show no acute abnormality. Telemetry leads overlie the chest.  IMPRESSION: Emphysema without acute cardiopulmonary findings. Electronically Signed   By: Misty Stanley M.D.   On: 08/17/2021 11:18    Procedures .Critical Care Performed by: Varney Biles, MD Authorized by: Varney Biles, MD   Critical care provider statement:    Critical care time (minutes):  40   Critical care was necessary to treat or prevent imminent or life-threatening deterioration of the following conditions:  Cardiac failure   Critical care was time spent personally by me on the following activities:  Development of treatment plan with patient or surrogate, discussions with consultants, evaluation of patient's response to treatment, examination of patient, ordering and review of laboratory studies, ordering and review of radiographic studies, ordering and performing  treatments and interventions, pulse oximetry, re-evaluation of patient's condition and review of old charts    Medications Ordered in ED Medications  LORazepam (ATIVAN) injection 0-4 mg (2 mg Intravenous Given 08/17/21 1153)    Or  LORazepam (ATIVAN) tablet 0-4 mg ( Oral See Alternative 08/17/21 1153)  LORazepam (ATIVAN) injection 0-4 mg (has no administration in time range)    Or  LORazepam (ATIVAN) tablet 0-4 mg (has no administration in time range)  thiamine tablet 100 mg ( Oral See Alternative 08/17/21 1153)    Or  thiamine (B-1) injection 100 mg (100 mg Intravenous Given 08/17/21 1153)  albuterol (PROVENTIL) (2.5 MG/3ML) 0.083% nebulizer solution 10 mg (10 mg Nebulization Given 08/17/21 1131)  ipratropium (ATROVENT) nebulizer solution 0.5 mg (0.5 mg Nebulization Given 08/17/21 1131)  furosemide (LASIX) injection 20 mg (20 mg Intravenous Given 08/17/21 1154)    ED Course/ Medical Decision Making/ A&P                           Medical Decision Making This patient presents to the ED with chief complaint(s) of shortness of breath, URI-like symptoms with pertinent past medical history of advanced COPD, CHF and alcoholism.  The complaint involves an extensive differential diagnosis and treatment options and also carries with it a high risk of complications and morbidity.    Differential diagnosis includes COVID-19, influenza, COPD exacerbation, pneumonia, pulmonary edema, CHF exacerbation, pulmonary embolism.  Patient is feeling short of breath.  He is not moving air really well.  He is tachypneic and wants to be placed on BiPAP.  He has exertional component to the shortness of breath and has orthopnea.  The initial plan is to stabilize the patient on BiPAP from respiratory perspective, at the minimum and will reduce to work of breathing.  We will start him on breathing treatments.  Chest x-ray ordered.   Additional Test Considered: Troponins.  Patient has had similar presentations in the past with normal troponins, and ACS being ruled out.  Given the familiarity of presentation in this case, we will refrain from ordering troponin as it does not add significant value.  We do not think patient has ACS clinically, CHF and COPD are much higher on the differential.  Comorbidities that complicate the patient evaluation: Patient's presentation is complicated by their history of : Combined CHF, COPD diagnosis along with noncompliance with his medication use.  Social Determinants of Health: Patient's poor compliance to medication, alcoholism increases the complexity of managing their presentation  Additional history obtained: Records reviewed previous admission documents  Reassessment and review: Lab Tests: I Ordered, and personally interpreted labs.  The pertinent results include: Elevated BNP, normal venous blood gas without respiratory acidosis, slightly elevated alcohol level, normal CBC.  Imaging Studies ordered: I independently visualized and interpreted the following imaging X-ray of the chest which showed no evidence of pulmonary edema, pneumothorax. I agree with the radiologist interpretation of the acute  finding.  Critical Interventions:       Initiate BiPAP -     Give DuoNeb and IV Lasix -      Initiate CIWA protocol  Cardiac Monitoring: The patient was maintained on a cardiac monitor.  I personally viewed and interpreted the cardiac monitor which showed an underlying rhythm of:  sinus rhythm  Medicines ordered and prescription drug management: I ordered medications, including DuoNeb and IV Lasix for what appears to be combined CHF, COPD presentation, leaning towards CHF being the more driver for  shortness of breath.  Reevaluation of the patient after these medicines showed that the patient    improved  Consultations Obtained: I requested consultation with the admitting physician, and discussed  findings as well as pertinent plan - they agree with the recommendation for admission.  Complexity of problems addressed: Patient's presentation is most consistent with  severe exacerbation of chronic illness     During patient's assessment  Disposition: After consideration of the diagnostic results and the patient's response to treatment,  I feel that the patent would benefit from admission to medicine.      Amount and/or Complexity of Data Reviewed External Data Reviewed: labs, radiology, ECG and notes. Labs: ordered. Decision-making details documented in ED Course. Radiology: ordered and independent interpretation performed. ECG/medicine tests: ordered and independent interpretation performed.  Risk OTC drugs. Prescription drug management. Decision regarding hospitalization.          Final Clinical Impression(s) / ED Diagnoses Final diagnoses:  Acute systolic congestive heart failure ([place])  Acute respiratory distress    Rx / DC Orders ED Discharge Orders     None         Varney Biles, MD 08/17/21 1315

## 2021-08-17 NOTE — Progress Notes (Signed)
°   08/17/21 1658  Vitals  Temp 99.6 F (37.6 C)  Temp Source Oral  BP 121/85  MAP (mmHg) 96  BP Location Left Arm  BP Method Automatic  Patient Position (if appropriate) Lying  Pulse Rate (!) 119  Pulse Rate Source Monitor  Resp 20  MEWS COLOR  MEWS Score Color Yellow  Oxygen Therapy  SpO2 98 %  O2 Device Room Air  Height and Weight  Height 5\' 7"  (1.702 m)  Weight 59 kg  Type of Scale Used Standing  Type of Weight Actual  BSA (Calculated - sq m) 1.67 sq meters  BMI (Calculated) 20.37  Weight in (lb) to have BMI = 25 159.3  MEWS Score  MEWS Temp 0  MEWS Systolic 0  MEWS Pulse 2  MEWS RR 0  MEWS LOC 0  MEWS Score 2

## 2021-08-17 NOTE — Progress Notes (Signed)
Patient's heart rate was noted to be 152, sustained per telemetry. Checked on patient and he was sleeping, some jerking movements. Notified MD who ordered a 500 ml bolus and an additional 1 mg of ativan.

## 2021-08-17 NOTE — Progress Notes (Signed)
°   08/17/21 2109  Vitals  Temp 98.9 F (37.2 C)  BP 97/77  MAP (mmHg) 85  BP Location Left Arm  BP Method Automatic  Patient Position (if appropriate) Lying  Pulse Rate (!) 120  Pulse Rate Source Monitor  Resp 20  MEWS COLOR  MEWS Score Color Yellow  Oxygen Therapy  SpO2 99 %  O2 Device Room Air  MEWS Score  MEWS Temp 0  MEWS Systolic 1  MEWS Pulse 2  MEWS RR 0  MEWS LOC 0  MEWS Score 3   Will continue to monitor patient, vital signs, and give medication as needed.

## 2021-08-17 NOTE — ED Notes (Signed)
Edp notified of elevated CIWA, instructed to give 2mg  ativan and remove bipap at this time. Will recheck ciwa after 30 min

## 2021-08-17 NOTE — ED Notes (Signed)
Tolerating bipap well

## 2021-08-17 NOTE — ED Notes (Signed)
Dr. Sherral Hammers notified about low etCO2 , noted low on vbg results as well earlier today.

## 2021-08-17 NOTE — ED Triage Notes (Signed)
Pt presents to ED via [facility] for SOB since 0400. Pt O2 sat 100% on RA.

## 2021-08-18 ENCOUNTER — Inpatient Hospital Stay (HOSPITAL_COMMUNITY): Payer: Medicaid Other

## 2021-08-18 ENCOUNTER — Other Ambulatory Visit (HOSPITAL_COMMUNITY): Payer: Self-pay | Admitting: *Deleted

## 2021-08-18 ENCOUNTER — Encounter (HOSPITAL_COMMUNITY): Payer: Self-pay | Admitting: Internal Medicine

## 2021-08-18 DIAGNOSIS — F1010 Alcohol abuse, uncomplicated: Secondary | ICD-10-CM

## 2021-08-18 DIAGNOSIS — I5021 Acute systolic (congestive) heart failure: Secondary | ICD-10-CM

## 2021-08-18 LAB — CBC
HCT: 41.3 % (ref 39.0–52.0)
Hemoglobin: 13.9 g/dL (ref 13.0–17.0)
MCH: 32.5 pg (ref 26.0–34.0)
MCHC: 33.7 g/dL (ref 30.0–36.0)
MCV: 96.5 fL (ref 80.0–100.0)
Platelets: 167 10*3/uL (ref 150–400)
RBC: 4.28 MIL/uL (ref 4.22–5.81)
RDW: 14 % (ref 11.5–15.5)
WBC: 3.2 10*3/uL — ABNORMAL LOW (ref 4.0–10.5)
nRBC: 0 % (ref 0.0–0.2)

## 2021-08-18 LAB — ECHOCARDIOGRAM COMPLETE
AR max vel: 0.73 cm2
AV Area VTI: 0.6 cm2
AV Area mean vel: 0.67 cm2
AV Mean grad: 18 mmHg
AV Peak grad: 30.8 mmHg
Ao pk vel: 2.78 m/s
Calc EF: 33.8 %
Height: 67 in
S' Lateral: 4.6 cm
Single Plane A2C EF: 34.6 %
Single Plane A4C EF: 32.9 %
Weight: 1960 oz

## 2021-08-18 LAB — COMPREHENSIVE METABOLIC PANEL
ALT: 58 U/L — ABNORMAL HIGH (ref 0–44)
AST: 114 U/L — ABNORMAL HIGH (ref 15–41)
Albumin: 3 g/dL — ABNORMAL LOW (ref 3.5–5.0)
Alkaline Phosphatase: 127 U/L — ABNORMAL HIGH (ref 38–126)
Anion gap: 10 (ref 5–15)
BUN: 13 mg/dL (ref 6–20)
CO2: 23 mmol/L (ref 22–32)
Calcium: 8.6 mg/dL — ABNORMAL LOW (ref 8.9–10.3)
Chloride: 102 mmol/L (ref 98–111)
Creatinine, Ser: 0.88 mg/dL (ref 0.61–1.24)
GFR, Estimated: >60 mL/min (ref >60)
Glucose, Bld: 207 mg/dL — ABNORMAL HIGH (ref 70–99)
Potassium: 3.8 mmol/L (ref 3.5–5.1)
Sodium: 135 mmol/L (ref 135–145)
Total Bilirubin: 2.5 mg/dL — ABNORMAL HIGH (ref 0.3–1.2)
Total Protein: 6.7 g/dL (ref 6.5–8.1)

## 2021-08-18 LAB — RAPID URINE DRUG SCREEN, HOSP PERFORMED
Amphetamines: NOT DETECTED
Barbiturates: NOT DETECTED
Benzodiazepines: POSITIVE — AB
Cocaine: POSITIVE — AB
Opiates: NOT DETECTED
Tetrahydrocannabinol: NOT DETECTED

## 2021-08-18 LAB — PROCALCITONIN: Procalcitonin: <0.1 ng/mL

## 2021-08-18 LAB — MAGNESIUM: Magnesium: 1.9 mg/dL (ref 1.7–2.4)

## 2021-08-18 LAB — AMMONIA: Ammonia: 131 umol/L — ABNORMAL HIGH (ref 9–35)

## 2021-08-18 LAB — PHOSPHORUS: Phosphorus: 3.9 mg/dL (ref 2.5–4.6)

## 2021-08-18 MED ORDER — SODIUM CHLORIDE 0.9 % IV BOLUS
500.0000 mL | Freq: Once | INTRAVENOUS | Status: AC
  Administered 2021-08-19: 500 mL via INTRAVENOUS

## 2021-08-18 MED ORDER — IPRATROPIUM-ALBUTEROL 0.5-2.5 (3) MG/3ML IN SOLN: 3.0000 mL | Freq: Three times a day (TID) | RESPIRATORY_TRACT | Status: DC

## 2021-08-18 MED ORDER — METHYLPREDNISOLONE SODIUM SUCC 125 MG IJ SOLR
80.0000 mg | Freq: Two times a day (BID) | INTRAMUSCULAR | Status: DC
  Administered 2021-08-18 – 2021-08-19 (×2): 80 mg via INTRAVENOUS
  Filled 2021-08-18 (×2): qty 2

## 2021-08-18 MED ORDER — LIVING BETTER WITH HEART FAILURE BOOK: Freq: Once | Status: AC

## 2021-08-18 MED ORDER — LEVALBUTEROL HCL 0.63 MG/3ML IN NEBU
0.6300 mg | INHALATION_SOLUTION | Freq: Three times a day (TID) | RESPIRATORY_TRACT | Status: DC
  Administered 2021-08-18 – 2021-08-19 (×2): 0.63 mg via RESPIRATORY_TRACT
  Filled 2021-08-18 (×3): qty 3

## 2021-08-18 MED ORDER — FUROSEMIDE 20 MG PO TABS
20.0000 mg | ORAL_TABLET | Freq: Every day | ORAL | Status: DC
  Administered 2021-08-19: 20 mg via ORAL
  Filled 2021-08-18: qty 1

## 2021-08-18 MED ORDER — LORAZEPAM 2 MG/ML IJ SOLN
1.0000 mg | Freq: Once | INTRAMUSCULAR | Status: AC
  Administered 2021-08-19: 1 mg via INTRAVENOUS
  Filled 2021-08-18: qty 1

## 2021-08-18 NOTE — Progress Notes (Addendum)
Progress Note    Francisco Robinson  JAS:[id number] DOB: 1960-10-06  DOA: 08/17/2021 PCP: Rosita Fire, MD      Brief Narrative:    Medical records reviewed and are as summarized below:  Francisco Robinson is a 61 y.o. male with medical history significant for COPD, chronic systolic and diastolic CHF (EF 30 to 41%, grade 1 diastolic dysfunction), tobacco use disorder, alcohol use disorder, alcoholic liver cirrhosis, stroke, seizures.  He presented to the hospital because of increasing shortness of breath, cough and congestion.      Assessment/Plan:   Principal Problem:   COPD exacerbation (HCC) Active Problems:   Hepatic cirrhosis ([place])   Alcohol abuse   Seizure ([place])   Cardiomyopathy ([place])   Noncompliance with medication regimen   Body mass index is 19.19 kg/m.   COPD exacerbation: Continue IV steroids, empiric IV antibiotics and bronchodilators  Alcohol use disorder with alcohol withdrawal syndrome: Use Ativan as needed per CIWA protocol.  Counseled to quit drinking alcohol.  Chronic systolic and diastolic CHF: Compensated.  Resume Lasix tomorrow  Alcoholic liver cirrhosis: No acute issues.  Ammonia level is elevated but he is not encephalopathic.  Tobacco use disorder: Counseled to quit smoking cigarettes.  Seizure disorder: He is not on any antiepileptics.  Medical nonadherence: The importance of medical adherence was reiterated.  Palliative care consult is pending.   Diet Order             Diet heart healthy/carb modified Room service appropriate? Yes; Fluid consistency: Thin; Fluid restriction: 1200 mL Fluid  Diet effective now                      Consultants: None  Procedures: None    Medications:    aspirin EC  81 mg Oral Daily   atorvastatin  40 mg Oral Daily   fluticasone furoate-vilanterol  1 puff Inhalation Daily   folic acid  1 mg Oral Daily   guaiFENesin  600 mg Oral BID   heparin  5,000 Units Subcutaneous Q8H    ipratropium-albuterol  3 mL Nebulization TID   LORazepam  0-4 mg Intravenous Q4H   Followed by   Derrill Memo ON 08/19/2021] LORazepam  0-4 mg Intravenous Q8H   multivitamin with minerals  1 tablet Oral Daily   nicotine  21 mg Transdermal Daily   thiamine  100 mg Oral Daily   Or   thiamine  100 mg Intravenous Daily   Continuous Infusions:  cefTRIAXone (ROCEPHIN)  IV 1 g (08/18/21 1631)     Anti-infectives (From admission, onward)    Start     Dose/Rate Route Frequency Ordered Stop   08/17/21 1600  cefTRIAXone (ROCEPHIN) 1 g in sodium chloride 0.9 % 100 mL IVPB        1 g 200 mL/hr over 30 Minutes Intravenous Every 24 hours 08/17/21 1528 08/22/21 1559              Family Communication/Anticipated D/C date and plan/Code Status   DVT prophylaxis: heparin injection 5,000 Units Start: 08/17/21 2200     Code Status: Full Code  Family Communication: None Disposition Plan: Plan to discharge home in 2 to 3 days   Status is: Inpatient  Remains inpatient appropriate because: IV antibiotics           Subjective:   Interval events noted.  He complains of shaking from alcohol withdrawal.  Objective:    Vitals:   08/18/21 0750 08/18/21 0756 08/18/21 1011 08/18/21  1342  BP:   115/85 109/80  Pulse:   (!) 104 (!) 108  Resp:   16 20  Temp:   98.5 F (36.9 C) 98.2 F (36.8 C)  TempSrc:      SpO2: 96% 100% 95% 98%  Weight:      Height:       No data found.   Intake/Output Summary (Last 24 hours) at 08/18/2021 1700 Last data filed at 08/18/2021 1300 Gross per 24 hour  Intake 2003.5 ml  Output 1050 ml  Net 953.5 ml   Filed Weights   08/17/21 1015 08/17/21 1658 08/18/21 0456  Weight: 59 kg 59 kg 55.6 kg    Exam:  GEN: NAD SKIN: No rash EYES: EOMI ENT: MMM CV: RRR PULM: CTA B ABD: soft, ND, NT, +BS CNS: AAO x 3, non focal, tremors of bilateral hands EXT: No edema or tenderness        Data Reviewed:   I have personally reviewed following labs  and imaging studies:  Labs: Labs show the following:   Basic Metabolic Panel: Recent Labs  Lab 08/17/21 1035 08/18/21 0417  NA 134* 135  K 3.7 3.8  CL 102 102  CO2 19* 23  GLUCOSE 121* 207*  BUN 6 13  CREATININE 0.72 0.88  CALCIUM 8.6* 8.6*  MG  --  1.9  PHOS  --  3.9   GFR Estimated Creatinine Clearance: 71.1 mL/min (by C-G formula based on SCr of 0.88 mg/dL). Liver Function Tests: Recent Labs  Lab 08/18/21 0417  AST 114*  ALT 58*  ALKPHOS 127*  BILITOT 2.5*  PROT 6.7  ALBUMIN 3.0*   No results for input(s): LIPASE, AMYLASE in the last 168 hours. Recent Labs  Lab 08/17/21 1604 08/18/21 0417  AMMONIA 27 131*   Coagulation profile No results for input(s): INR, PROTIME in the last 168 hours.  CBC: Recent Labs  Lab 08/17/21 1035 08/18/21 0417  WBC 6.2 3.2*  NEUTROABS 4.3  --   HGB 14.1 13.9  HCT 41.5 41.3  MCV 98.6 96.5  PLT 176 167   Cardiac Enzymes: No results for input(s): CKTOTAL, CKMB, CKMBINDEX, TROPONINI in the last 168 hours. BNP (last 3 results) No results for input(s): PROBNP in the last 8760 hours. CBG: No results for input(s): GLUCAP in the last 168 hours. D-Dimer: No results for input(s): DDIMER in the last 72 hours. Hgb A1c: No results for input(s): HGBA1C in the last 72 hours. Lipid Profile: No results for input(s): CHOL, HDL, LDLCALC, TRIG, CHOLHDL, LDLDIRECT in the last 72 hours. Thyroid function studies: Recent Labs    08/17/21 1526  TSH 1.937   Anemia work up: No results for input(s): VITAMINB12, FOLATE, FERRITIN, TIBC, IRON, RETICCTPCT in the last 72 hours. Sepsis Labs: Recent Labs  Lab 08/17/21 1035 08/17/21 1604 08/18/21 0417  PROCALCITON  --  <0.10 <0.10  WBC 6.2  --  3.2*    Microbiology Recent Results (from the past 240 hour(s))  Culture, blood (Routine X 2) w Reflex to ID Panel     Status: None (Preliminary result)   Collection Time: 08/17/21 10:35 AM   Specimen: Right Antecubital; Blood  Result Value  Ref Range Status   Specimen Description   Final    RIGHT ANTECUBITAL BOTTLES DRAWN AEROBIC AND ANAEROBIC   Special Requests   Final    Blood Culture results may not be optimal due to an excessive volume of blood received in culture bottles Performed at [facility], [place]  [address], [place], [place] [address]    Culture PENDING  Incomplete   Report Status PENDING  Incomplete  Resp Panel by RT-PCR (Flu A&B, Covid) Nasopharyngeal Swab     Status: None   Collection Time: 08/17/21 10:55 AM   Specimen: Nasopharyngeal Swab; Nasopharyngeal(NP) swabs in vial transport medium  Result Value Ref Range Status   SARS Coronavirus 2 by RT PCR NEGATIVE NEGATIVE Final    Comment: (NOTE) SARS-CoV-2 target nucleic acids are NOT DETECTED.  The SARS-CoV-2 RNA is generally detectable in upper respiratory specimens during the acute phase of infection. The lowest concentration of SARS-CoV-2 viral copies this assay can detect is 138 copies/mL. A negative result does not preclude SARS-Cov-2 infection and should not be used as the sole basis for treatment or other patient management decisions. A negative result may occur with  improper specimen collection/handling, submission of specimen other than nasopharyngeal swab, presence of viral mutation(s) within the areas targeted by this assay, and inadequate number of viral copies(<138 copies/mL). A negative result must be combined with clinical observations, patient history, and epidemiological information. The expected result is Negative.  Fact Sheet for Patients:  EntrepreneurPulse.com.au  Fact Sheet for Healthcare Providers:  IncredibleEmployment.be  This test is no t yet approved or cleared by the [place] FDA and  has been authorized for detection and/or diagnosis of SARS-CoV-2 by FDA under an Emergency Use Authorization (EUA). This EUA will remain  in effect (meaning this test can be used) for the duration of  the COVID-19 declaration under Section 564(b)(1) of the Act, 21 U.S.C.section 360bbb-3(b)(1), unless the authorization is terminated  or revoked sooner.       Influenza A by PCR NEGATIVE NEGATIVE Final   Influenza B by PCR NEGATIVE NEGATIVE Final    Comment: (NOTE) The Xpert Xpress SARS-CoV-2/FLU/RSV plus assay is intended as an aid in the diagnosis of influenza from Nasopharyngeal swab specimens and should not be used as a sole basis for treatment. Nasal washings and aspirates are unacceptable for Xpert Xpress SARS-CoV-2/FLU/RSV testing.  Fact Sheet for Patients: EntrepreneurPulse.com.au  Fact Sheet for Healthcare Providers: IncredibleEmployment.be  This test is not yet approved or cleared by the [place] FDA and has been authorized for detection and/or diagnosis of SARS-CoV-2 by FDA under an Emergency Use Authorization (EUA). This EUA will remain in effect (meaning this test can be used) for the duration of the COVID-19 declaration under Section 564(b)(1) of the Act, 21 U.S.C. section 360bbb-3(b)(1), unless the authorization is terminated or revoked.  Performed at [facility], [address], [place], [place] [address]   Culture, blood (Routine X 2) w Reflex to ID Panel     Status: None (Preliminary result)   Collection Time: 08/17/21  3:40 PM   Specimen: Right Antecubital; Blood  Result Value Ref Range Status   Specimen Description   Final    RIGHT ANTECUBITAL BOTTLES DRAWN AEROBIC AND ANAEROBIC   Special Requests   Final    Blood Culture adequate volume Performed at [facility], [address] [place], [place] [address]    Culture PENDING  Incomplete   Report Status PENDING  Incomplete    Procedures and diagnostic studies:  DG Chest 2 View  Result Date: 08/18/2021 CLINICAL DATA:  61 year old male with history of intermittent shortness of breath and weakness. History of COPD. EXAM: CHEST - 2 VIEW COMPARISON:  Chest x-ray  08/17/2021. FINDINGS: Lung volumes are normal. Diffuse peribronchial cuffing. No consolidative airspace disease. No pleural effusions. No pneumothorax. No pulmonary nodule  or mass noted. Pulmonary vasculature and the cardiomediastinal silhouette are within normal limits. Atherosclerosis in the thoracic aorta. IMPRESSION: 1. Diffuse peribronchial cuffing, which could be indicative of acute or chronic bronchitis. 2. Aortic atherosclerosis. Electronically Signed   By: Vinnie Langton M.D.   On: 08/18/2021 06:52   DG Chest Port 1 View  Result Date: 08/17/2021 CLINICAL DATA:  Shortness of breath. EXAM: PORTABLE CHEST 1 VIEW COMPARISON:  07/26/2021 FINDINGS: The lungs are clear without focal pneumonia, edema, pneumothorax or pleural effusion. Interstitial markings are diffusely coarsened with chronic features. Hyperexpansion suggests emphysema. Cardiopericardial silhouette is at upper limits of normal for size. The visualized bony structures of the thorax show no acute abnormality. Telemetry leads overlie the chest. IMPRESSION: Emphysema without acute cardiopulmonary findings. Electronically Signed   By: Misty Stanley M.D.   On: 08/17/2021 11:18               LOS: 1 day   Francisco Robinson  [facility]   Pager on [email or web address]. If 7PM-7AM, please contact night-coverage at [email or web address]     08/18/2021, 5:00 PM

## 2021-08-18 NOTE — Progress Notes (Signed)
°  Echocardiogram 2D Echocardiogram has been performed.  Francisco Robinson F 08/18/2021, 3:13 PM

## 2021-08-18 NOTE — Progress Notes (Signed)
°   08/18/21 1309  Provider Notification  Provider Name/Title Dr Mal Misty  Date Provider Notified 08/18/21  Time Provider Notified 1309  Notification Type  (secure chat message)  Notification Reason Other (Comment) (HR elevated EKG results, provider was already aware of elevated HR so secure chat message sent instead of page)  Provider response See new orders (hold off on duoneb at this time d/t elevated HR, RT notified)  Date of Provider Response 08/18/21  Time of Provider Response 1312

## 2021-08-18 NOTE — TOC Initial Note (Signed)
Transition of Care [facility]) - Initial/Assessment Note    Patient Details  Name: Francisco Robinson MRN: [id number] Date of Birth: January 13, 1961  [facility] CM/SW Contact:    Shade Flood, LCSW Phone Number: 08/18/2021, 1:44 PM  Clinical Narrative:                  Pt admitted from home. TOC consulted for SA treatment resource provision and HF HH screen. Met with pt at bedside today to assess. Pt A&O x4 at the time of assessment. Pt states that he lives alone and is independent in ADLs. He does not drive but has neighbors who help him out with a ride when he needs one. Per pt, he is down to smoking one cigarette a day but he still drinks a fair amount of ETOH. Treatment resources offered but pt declined. Pt reports that he does not follow CHF diet/fluid guidelines/weighing recommendations. He states that Dr. Legrand Rams is his PCP but he hasn't seen him in a very long time. Pt agreeable to [facility] trying to get him a hospital follow up appointment with Dr. Legrand Rams.  Pt expressed concern about needing RX for albuterol inhaler at dc and also wondering if he needs O2 at home. In addition, pt asking if he should be getting Albuterol inhaler while he is here in the hospital. Explained to pt that this LCSW would relay his concerns to the attending MD and message was relayed a few minutes ago.  Attempted to call Dr. Josephine Cables office to schedule appointment but had to leave a message requesting return call.  TOC will follow and continue to assess and assist with dc planning.  Expected Discharge Plan: Home/Self Care Barriers to Discharge: Continued Medical Work up   Patient Goals and CMS Choice Patient states their goals for this hospitalization and ongoing recovery are:: go home      Expected Discharge Plan and Services Expected Discharge Plan: Home/Self Care In-house Referral: Clinical Social Work     Living arrangements for the past 2 months: Mobile Home                                       Prior Living Arrangements/Services Living arrangements for the past 2 months: Mobile Home Lives with:: Self Patient language and need for interpreter reviewed:: Yes Do you feel safe going back to the place where you live?: Yes      Need for Family Participation in Patient Care: No (Comment) Care giver support system in place?: No (comment)   Criminal Activity/Legal Involvement Pertinent to Current Situation/Hospitalization: No - Comment as needed  Activities of Daily Living Home Assistive Devices/Equipment: Cane (specify quad or straight) ADL Screening (condition at time of admission) Patient's cognitive ability adequate to safely complete daily activities?: Yes Is the patient deaf or have difficulty hearing?: No Does the patient have difficulty seeing, even when wearing glasses/contacts?: No Does the patient have difficulty concentrating, remembering, or making decisions?: No Patient able to express need for assistance with ADLs?: Yes Does the patient have difficulty dressing or bathing?: No Independently performs ADLs?: Yes (appropriate for developmental age) Does the patient have difficulty walking or climbing stairs?: No Weakness of Legs: Both Weakness of Arms/Hands: Both  Permission Sought/Granted                  Emotional Assessment Appearance:: Appears stated age Attitude/Demeanor/Rapport: Engaged Affect (typically observed): Pleasant Orientation: :  Oriented to Self, Oriented to Place, Oriented to  Time, Oriented to Situation Alcohol / Substance Use: Alcohol Use Psych Involvement: No (comment)  Admission diagnosis:  COPD (chronic obstructive pulmonary disease) ([place]) [J44.9] Acute respiratory distress [R06.03] COPD exacerbation (HCC) [D42.8] Acute systolic congestive heart failure ([place]) [I50.21] Patient Active Problem List   Diagnosis Date Noted   COPD exacerbation ([place]) 08/17/2021   Noncompliance with medication regimen 08/17/2021   Cardiomyopathy  ([place]) 07/07/2021   Pneumonia 07/05/2021   Delirium tremens/[place] Withdrawal 06/17/2021   Acute respiratory failure with hypoxia ([place]) 06/14/2021   History of stroke 06/14/2021   Seizure ([place]) 06/14/2021   History of MI (myocardial infarction) 06/14/2021   History of ETOH abuse 06/14/2021   Diabetes mellitus without complication ([place]) 76/81/1572   Hepatic cirrhosis ([place]) 06/11/2014   Constipation 06/11/2014   Alcohol abuse 07/15/2011   COPD (chronic obstructive pulmonary disease) ([place]) 07/15/2011   Chronic pain due to trauma 07/15/2011   PCP:  Rosita Fire, MD Pharmacy:   [facility] [place] [address] [place] [address] Phone: [phone] Fax: [phone]     Social Determinants of Health (SDOH) Interventions    Readmission Risk Interventions Readmission Risk Prevention Plan 08/18/2021  Transportation Screening Complete  Home Care Screening Complete  Medication Review (RN CM) Complete  Some recent data might be hidden

## 2021-08-19 ENCOUNTER — Encounter (HOSPITAL_COMMUNITY): Payer: Self-pay | Admitting: Internal Medicine

## 2021-08-19 DIAGNOSIS — Z515 Encounter for palliative care: Secondary | ICD-10-CM

## 2021-08-19 DIAGNOSIS — Z7189 Other specified counseling: Secondary | ICD-10-CM

## 2021-08-19 DIAGNOSIS — J441 Chronic obstructive pulmonary disease with (acute) exacerbation: Secondary | ICD-10-CM

## 2021-08-19 DIAGNOSIS — F10931 Alcohol use, unspecified with withdrawal delirium: Secondary | ICD-10-CM

## 2021-08-19 LAB — CBC
HCT: 38 % — ABNORMAL LOW (ref 39.0–52.0)
Hemoglobin: 12.4 g/dL — ABNORMAL LOW (ref 13.0–17.0)
MCH: 32.7 pg (ref 26.0–34.0)
MCHC: 32.6 g/dL (ref 30.0–36.0)
MCV: 100.3 fL — ABNORMAL HIGH (ref 80.0–100.0)
Platelets: 141 10*3/uL — ABNORMAL LOW (ref 150–400)
RBC: 3.79 MIL/uL — ABNORMAL LOW (ref 4.22–5.81)
RDW: 14.4 % (ref 11.5–15.5)
WBC: 11.8 10*3/uL — ABNORMAL HIGH (ref 4.0–10.5)
nRBC: 0 % (ref 0.0–0.2)

## 2021-08-19 LAB — PROCALCITONIN: Procalcitonin: <0.1 ng/mL

## 2021-08-19 LAB — PHOSPHORUS: Phosphorus: 4.3 mg/dL (ref 2.5–4.6)

## 2021-08-19 LAB — MAGNESIUM: Magnesium: 1.9 mg/dL (ref 1.7–2.4)

## 2021-08-19 LAB — COMPREHENSIVE METABOLIC PANEL
ALT: 54 U/L — ABNORMAL HIGH (ref 0–44)
AST: 81 U/L — ABNORMAL HIGH (ref 15–41)
Albumin: 3.1 g/dL — ABNORMAL LOW (ref 3.5–5.0)
Alkaline Phosphatase: 128 U/L — ABNORMAL HIGH (ref 38–126)
Anion gap: 10 (ref 5–15)
BUN: 17 mg/dL (ref 6–20)
CO2: 22 mmol/L (ref 22–32)
Calcium: 8.7 mg/dL — ABNORMAL LOW (ref 8.9–10.3)
Chloride: 104 mmol/L (ref 98–111)
Creatinine, Ser: 0.73 mg/dL (ref 0.61–1.24)
GFR, Estimated: >60 mL/min (ref >60)
Glucose, Bld: 137 mg/dL — ABNORMAL HIGH (ref 70–99)
Potassium: 4.2 mmol/L (ref 3.5–5.1)
Sodium: 136 mmol/L (ref 135–145)
Total Bilirubin: 1.2 mg/dL (ref 0.3–1.2)
Total Protein: 6.6 g/dL (ref 6.5–8.1)

## 2021-08-19 LAB — AMMONIA: Ammonia: 32 umol/L (ref 9–35)

## 2021-08-19 LAB — MRSA NEXT GEN BY PCR, NASAL: MRSA by PCR Next Gen: NOT DETECTED

## 2021-08-19 MED ORDER — LORAZEPAM 2 MG/ML IJ SOLN
1.0000 mg | Freq: Once | INTRAMUSCULAR | Status: AC
  Administered 2021-08-19: 1 mg via INTRAVENOUS
  Filled 2021-08-19: qty 1

## 2021-08-19 MED ORDER — DIAZEPAM 5 MG PO TABS
10.0000 mg | ORAL_TABLET | Freq: Three times a day (TID) | ORAL | Status: DC
  Administered 2021-08-19: 10 mg via ORAL
  Filled 2021-08-19: qty 2

## 2021-08-19 MED ORDER — DEXMEDETOMIDINE HCL IN NACL 200 MCG/50ML IV SOLN: 0.4000 ug/kg/h | INTRAVENOUS | Status: DC

## 2021-08-19 MED ORDER — LORAZEPAM 2 MG/ML IJ SOLN
3.0000 mg | Freq: Once | INTRAMUSCULAR | Status: AC
  Administered 2021-08-19: 3 mg via INTRAVENOUS
  Filled 2021-08-19: qty 2

## 2021-08-19 MED ORDER — CHLORHEXIDINE GLUCONATE CLOTH 2 % EX PADS
6.0000 | MEDICATED_PAD | Freq: Every day | CUTANEOUS | Status: DC
  Administered 2021-08-19: 6 via TOPICAL

## 2021-08-19 MED ORDER — DEXMEDETOMIDINE HCL IN NACL 400 MCG/100ML IV SOLN
0.4000 ug/kg/h | INTRAVENOUS | Status: DC
  Administered 2021-08-19: 0.4 ug/kg/h via INTRAVENOUS
  Filled 2021-08-19: qty 100

## 2021-08-19 MED ORDER — HALOPERIDOL LACTATE 5 MG/ML IJ SOLN
5.0000 mg | Freq: Four times a day (QID) | INTRAMUSCULAR | Status: DC | PRN
  Administered 2021-08-19: 5 mg via INTRAVENOUS
  Filled 2021-08-19: qty 1

## 2021-08-19 NOTE — Progress Notes (Signed)
Patient adamant about leaving AMA, Dr. Mal Misty notified and came to bedside. Patient signed AMA paper and left ICU. Security made aware to keep an eye out for patient leaving hospital. IV removed prior to patient leaving.

## 2021-08-19 NOTE — Progress Notes (Signed)
Patient has been agitated and restless this shift. He has received several doses of ativan. He states he has a low tolerance and needs more. When giving him ativan in pill form he asked me to crush them, mix with water, and let him "shoot them up". He then said he could snort them. He has been begging for ativan, asking for a dose even after receiving one. He has been up and out of bed, pulling and biting at his IV, and getting up out of bed and pacing the room. Bed alarm is on so we can be sure he is safe, doesn't fall. Rounding done every hour, usually more often because he get out of bed and sets the alarm off. He is a yellow MEWS due to pulse. MD is aware. Extra dose of ativan and bolus of 500 ml was given for this. Patient continually asking for beer as well. UA positive for cocaine. States last time he used cocaine was a few days ago.

## 2021-08-19 NOTE — Progress Notes (Signed)
°   08/19/21 0602  Assess: MEWS Score  Temp 97.9 F (36.6 C)  BP 121/82  Pulse Rate (!) 113  Resp 20  SpO2 99 %  O2 Device Room Air  Assess: MEWS Score  MEWS Temp 0  MEWS Systolic 0  MEWS Pulse 2  MEWS RR 0  MEWS LOC 0  MEWS Score 2  MEWS Score Color Yellow  Assess: if the MEWS score is Yellow or Red  Were vital signs taken at a resting state? Yes  Focused Assessment No change from prior assessment  Early Detection of Sepsis Score *See Row Information* Low  MEWS guidelines implemented *See Row Information* No, previously yellow, continue vital signs every 4 hours

## 2021-08-19 NOTE — Progress Notes (Signed)
°   08/19/21 0043  Assess: MEWS Score  Temp 97.9 F (36.6 C)  BP 124/90  Pulse Rate (!) 124  Resp 20  SpO2 100 %  O2 Device Room Air  Assess: MEWS Score  MEWS Temp 0  MEWS Systolic 0  MEWS Pulse 2  MEWS RR 0  MEWS LOC 0  MEWS Score 2  MEWS Score Color Yellow  Assess: if the MEWS score is Yellow or Red  Were vital signs taken at a resting state? Yes  Focused Assessment No change from prior assessment  Early Detection of Sepsis Score *See Row Information* Low  MEWS guidelines implemented *See Row Information* No, previously yellow, continue vital signs every 4 hours  Treat  MEWS Interventions Administered scheduled meds/treatments  Take Vital Signs  Increase Vital Sign Frequency  Yellow: Q 2hr X 2 then Q 4hr X 2, if remains yellow, continue Q 4hrs  Notify: Charge Nurse/RN  Name of Charge Nurse/RN Notified Reather Converse RN  Date Charge Nurse/RN Notified 08/19/21  Time Charge Nurse/RN Notified [phone]

## 2021-08-19 NOTE — Discharge Summary (Addendum)
Mr. Francisco Robinson is a 61 y.o. male with medical history significant for COPD, chronic systolic and diastolic CHF (EF 30 to 82%, grade 1 diastolic dysfunction), tobacco use disorder, alcohol use disorder, alcoholic liver cirrhosis, stroke, seizures.  He presented to the hospital because of increasing shortness of breath, cough and congestion.  He was admitted to the hospital for COPD exacerbation. He probably had CHF exacerbation as well. He was given IV Lasix in the ED. He was also treated with steroids and bronchodilators. He developed alcohol withdrawal syndrome with delirium/agitation requiring multiple doses of IV Ativan.  He was subsequently transferred to the ICU for IV Precedex infusion.  He became calm and cooperative but he insisted on leaving the hospital [place].  He said he was feeling better and wanted to go home.  He said he lives just across from the hospital.  He was told he was not quite ready for discharge home.  However he was adamant about going home.  He is alert, oriented to person, place, time and situation.  [place], RN, was at the bedside during this encounter.

## 2021-08-19 NOTE — Progress Notes (Addendum)
Progress Note    [name]  PJK:[id number] DOB: May 17, 1961  DOA: 08/17/2021 PCP: Rosita Fire, MD      Brief Narrative:    Medical records reviewed and are as summarized below:  Francisco Robinson is a 61 y.o. male with medical history significant for COPD, chronic systolic and diastolic CHF (EF 30 to 80%, grade 1 diastolic dysfunction), tobacco use disorder, alcohol use disorder, alcoholic liver cirrhosis, stroke, seizures.  He presented to the hospital because of increasing shortness of breath, cough and congestion.      Assessment/Plan:   Principal Problem:   COPD exacerbation ([place]) Active Problems:   Hepatic cirrhosis ([place])   Alcohol abuse   Seizure ([place])   Delirium tremens/[place] Withdrawal   Cardiomyopathy ([place])   Noncompliance with medication regimen   Body mass index is 19.51 kg/m.   COPD exacerbation: Continue IV steroids, antibiotics and bronchodilators.  Alcohol use disorder with alcohol withdrawal syndrome/delirium tremens: Transfer patient to ICU for IV Precedex infusion per protocol.  Chronic systolic and diastolic CHF: Compensated.  Continue Lasix as able  Alcoholic liver cirrhosis, hyperammonemia: Ammonia level has normalized.  Tobacco use disorder: Counseled to quit smoking cigarettes.  Seizure disorder: He is not on any antiepileptics.  Medical nonadherence: The importance of medical adherence was reiterated. Palliative care team has been consulted.  CRITICAL CARE Performed by: Jennye Boroughs   Total critical care time: 32 minutes   Critical care time was exclusive of separately billable procedures and treating other patients.  Critical care was necessary to treat or prevent imminent or life-threatening deterioration.  Critical care was time spent personally by me on the following activities: development of treatment plan with patient and/or surrogate as well as nursing, discussions with consultants, evaluation of patient's  response to treatment, examination of patient, obtaining history from patient or surrogate, ordering and performing treatments and interventions, ordering and review of laboratory studies, ordering and review of radiographic studies, pulse oximetry and re-evaluation of patient's condition.    Diet Order             Diet heart healthy/carb modified Room service appropriate? Yes; Fluid consistency: Thin; Fluid restriction: 1200 mL Fluid  Diet effective now                      Consultants: None  Procedures: None    Medications:    aspirin EC  81 mg Oral Daily   atorvastatin  40 mg Oral Daily   Chlorhexidine Gluconate Cloth  6 each Topical Daily   diazepam  10 mg Oral TID   fluticasone furoate-vilanterol  1 puff Inhalation Daily   folic acid  1 mg Oral Daily   furosemide  20 mg Oral Daily   guaiFENesin  600 mg Oral BID   heparin  5,000 Units Subcutaneous Q8H   levalbuterol  0.63 mg Nebulization TID   LORazepam  0-4 mg Intravenous Q4H   Followed by   LORazepam  0-4 mg Intravenous Q8H   methylPREDNISolone (SOLU-MEDROL) injection  80 mg Intravenous Q12H   multivitamin with minerals  1 tablet Oral Daily   nicotine  21 mg Transdermal Daily   thiamine  100 mg Oral Daily   Or   thiamine  100 mg Intravenous Daily   Continuous Infusions:  cefTRIAXone (ROCEPHIN)  IV 1 g (08/18/21 1631)   dexmedetomidine 0.4 mcg/kg/hr (08/19/21 1134)     Anti-infectives (From admission, onward)    Start     Dose/Rate Route Frequency  Ordered Stop   08/17/21 1600  cefTRIAXone (ROCEPHIN) 1 g in sodium chloride 0.9 % 100 mL IVPB        1 g 200 mL/hr over 30 Minutes Intravenous Every 24 hours 08/17/21 1528 08/22/21 1559              Family Communication/Anticipated D/C date and plan/Code Status   DVT prophylaxis: heparin injection 5,000 Units Start: 08/17/21 2200     Code Status: Full Code  Family Communication: None Disposition Plan: Plan to discharge home  when medically  stable   Status is: Inpatient  Remains inpatient appropriate because: Alcohol withdrawal syndrome requiring Precedex drip           Subjective:   Interval events noted.  Patient had received several doses of Ativan but was still agitated and combative.  He had to be placed in wrist restraints. He said "Doc, see what they've done to me".   Objective:    Vitals:   08/19/21 1133 08/19/21 1137 08/19/21 1139 08/19/21 1200  BP: 115/89 115/89  126/88  Pulse: (!) 118 (!) 115 (!) 114 (!) 113  Resp: 19 (!) 21 20 17   Temp:   (!) 97.2 F (36.2 C)   TempSrc:   Oral   SpO2: 98% 97% 97% 98%  Weight:   56.5 kg   Height:   5\' 7"  (1.702 m)    No data found.   Intake/Output Summary (Last 24 hours) at 08/19/2021 1249 Last data filed at 08/19/2021 0900 Gross per 24 hour  Intake 1799.86 ml  Output 850 ml  Net 949.86 ml   Filed Weights   08/18/21 0456 08/19/21 0205 08/19/21 1139  Weight: 55.6 kg 57.9 kg 56.5 kg    Exam:  GEN: NAD, in soft wrist restraints SKIN: Warm and dry EYES: EOMI ENT: MMM CV: RRR PULM: CTA B ABD: soft, ND, NT, +BS CNS: Alert, tremors of bilateral hands EXT: No edema or tenderness         Data Reviewed:   I have personally reviewed following labs and imaging studies:  Labs: Labs show the following:   Basic Metabolic Panel: Recent Labs  Lab 08/17/21 1035 08/18/21 0417 08/19/21 0503  NA 134* 135 136  K 3.7 3.8 4.2  CL 102 102 104  CO2 19* 23 22  GLUCOSE 121* 207* 137*  BUN 6 13 17   CREATININE 0.72 0.88 0.73  CALCIUM 8.6* 8.6* 8.7*  MG  --  1.9 1.9  PHOS  --  3.9 4.3   GFR Estimated Creatinine Clearance: 79.5 mL/min (by C-G formula based on SCr of 0.73 mg/dL). Liver Function Tests: Recent Labs  Lab 08/18/21 0417 08/19/21 0503  AST 114* 81*  ALT 58* 54*  ALKPHOS 127* 128*  BILITOT 2.5* 1.2  PROT 6.7 6.6  ALBUMIN 3.0* 3.1*   No results for input(s): LIPASE, AMYLASE in the last 168 hours. Recent Labs  Lab 08/17/21 1604  08/18/21 0417 08/19/21 0503  AMMONIA 27 131* 32   Coagulation profile No results for input(s): INR, PROTIME in the last 168 hours.  CBC: Recent Labs  Lab 08/17/21 1035 08/18/21 0417 08/19/21 0503  WBC 6.2 3.2* 11.8*  NEUTROABS 4.3  --   --   HGB 14.1 13.9 12.4*  HCT 41.5 41.3 38.0*  MCV 98.6 96.5 100.3*  PLT 176 167 141*   Cardiac Enzymes: No results for input(s): CKTOTAL, CKMB, CKMBINDEX, TROPONINI in the last 168 hours. BNP (last 3 results) No results for input(s): PROBNP in the  last 8760 hours. CBG: No results for input(s): GLUCAP in the last 168 hours. D-Dimer: No results for input(s): DDIMER in the last 72 hours. Hgb A1c: No results for input(s): HGBA1C in the last 72 hours. Lipid Profile: No results for input(s): CHOL, HDL, LDLCALC, TRIG, CHOLHDL, LDLDIRECT in the last 72 hours. Thyroid function studies: Recent Labs    08/17/21 1526  TSH 1.937   Anemia work up: No results for input(s): VITAMINB12, FOLATE, FERRITIN, TIBC, IRON, RETICCTPCT in the last 72 hours. Sepsis Labs: Recent Labs  Lab 08/17/21 1035 08/17/21 1604 08/18/21 0417 08/19/21 0503  PROCALCITON  --  <0.10 <0.10 <0.10  WBC 6.2  --  3.2* 11.8*    Microbiology Recent Results (from the past 240 hour(s))  Culture, blood (Routine X 2) w Reflex to ID Panel     Status: None (Preliminary result)   Collection Time: 08/17/21 10:35 AM   Specimen: Right Antecubital; Blood  Result Value Ref Range Status   Specimen Description   Final    RIGHT ANTECUBITAL BOTTLES DRAWN AEROBIC AND ANAEROBIC   Special Requests   Final    Blood Culture results may not be optimal due to an excessive volume of blood received in culture bottles   Culture   Final    NO GROWTH 2 DAYS Performed at [facility], [address] [place], [place] [address]    Report Status PENDING  Incomplete  Resp Panel by RT-PCR (Flu A&B, Covid) Nasopharyngeal Swab     Status: None   Collection Time: 08/17/21 10:55 AM   Specimen:  Nasopharyngeal Swab; Nasopharyngeal(NP) swabs in vial transport medium  Result Value Ref Range Status   SARS Coronavirus 2 by RT PCR NEGATIVE NEGATIVE Final    Comment: (NOTE) SARS-CoV-2 target nucleic acids are NOT DETECTED.  The SARS-CoV-2 RNA is generally detectable in upper respiratory specimens during the acute phase of infection. The lowest concentration of SARS-CoV-2 viral copies this assay can detect is 138 copies/mL. A negative result does not preclude SARS-Cov-2 infection and should not be used as the sole basis for treatment or other patient management decisions. A negative result may occur with  improper specimen collection/handling, submission of specimen other than nasopharyngeal swab, presence of viral mutation(s) within the areas targeted by this assay, and inadequate number of viral copies(<138 copies/mL). A negative result must be combined with clinical observations, patient history, and epidemiological information. The expected result is Negative.  Fact Sheet for Patients:  EntrepreneurPulse.com.au  Fact Sheet for Healthcare Providers:  IncredibleEmployment.be  This test is no t yet approved or cleared by the [place] FDA and  has been authorized for detection and/or diagnosis of SARS-CoV-2 by FDA under an Emergency Use Authorization (EUA). This EUA will remain  in effect (meaning this test can be used) for the duration of the COVID-19 declaration under Section 564(b)(1) of the Act, 21 U.S.C.section 360bbb-3(b)(1), unless the authorization is terminated  or revoked sooner.       Influenza A by PCR NEGATIVE NEGATIVE Final   Influenza B by PCR NEGATIVE NEGATIVE Final    Comment: (NOTE) The Xpert Xpress SARS-CoV-2/FLU/RSV plus assay is intended as an aid in the diagnosis of influenza from Nasopharyngeal swab specimens and should not be used as a sole basis for treatment. Nasal washings and aspirates are unacceptable for  Xpert Xpress SARS-CoV-2/FLU/RSV testing.  Fact Sheet for Patients: EntrepreneurPulse.com.au  Fact Sheet for Healthcare Providers: IncredibleEmployment.be  This test is not yet approved or cleared by the [place] FDA  and has been authorized for detection and/or diagnosis of SARS-CoV-2 by FDA under an Emergency Use Authorization (EUA). This EUA will remain in effect (meaning this test can be used) for the duration of the COVID-19 declaration under Section 564(b)(1) of the Act, 21 U.S.C. section 360bbb-3(b)(1), unless the authorization is terminated or revoked.  Performed at [facility], [address], [place], [place] [address]   Culture, blood (Routine X 2) w Reflex to ID Panel     Status: None (Preliminary result)   Collection Time: 08/17/21  3:40 PM   Specimen: Right Antecubital; Blood  Result Value Ref Range Status   Specimen Description   Final    RIGHT ANTECUBITAL BOTTLES DRAWN AEROBIC AND ANAEROBIC   Special Requests Blood Culture adequate volume  Final   Culture   Final    NO GROWTH 2 DAYS Performed at [facility], [address] [place], [place] [address]    Report Status PENDING  Incomplete    Procedures and diagnostic studies:  DG Chest 2 View  Result Date: 08/18/2021 CLINICAL DATA:  61 year old male with history of intermittent shortness of breath and weakness. History of COPD. EXAM: CHEST - 2 VIEW COMPARISON:  Chest x-ray 08/17/2021. FINDINGS: Lung volumes are normal. Diffuse peribronchial cuffing. No consolidative airspace disease. No pleural effusions. No pneumothorax. No pulmonary nodule or mass noted. Pulmonary vasculature and the cardiomediastinal silhouette are within normal limits. Atherosclerosis in the thoracic aorta. IMPRESSION: 1. Diffuse peribronchial cuffing, which could be indicative of acute or chronic bronchitis. 2. Aortic atherosclerosis. Electronically Signed   By: Vinnie Langton M.D.   On: 08/18/2021 06:52    ECHOCARDIOGRAM COMPLETE  Result Date: 08/18/2021    ECHOCARDIOGRAM REPORT   Patient Name:   Francisco Robinson Date of Exam: 08/18/2021 Medical Rec #:  [id number]        Height:       67.0 in Accession #:    [id number]       Weight:       122.5 lb Date of Birth:  07-06-1961        BSA:          1.642 m Patient Age:    30 years         BP:           109/80 mmHg Patient Gender: M                HR:           125 bpm. Exam Location:  [facility] Procedure: 2D Echo, Cardiac Doppler, Color Doppler and 3D Echo Indications:    CHF-Acute Systolic T88.82  History:        Patient has prior history of Echocardiogram examinations, most                 recent 06/16/2021. Cardiomyopathy, Previous Myocardial                 Infarction, COPD and Stroke; Risk Factors:Diabetes. Alcohol                 abuse.  Sonographer:    Merrie Roof RDCS Referring Phys: [id number] [place]  1. LVEF is depressed with severe hypokinesis of the inferior, distal anterior, apical, distal lateral, septal walls. Compared to images fro echo in Nov 2022, no significant change. Left ventricular ejection fraction, by estimation, is 30%%. The left ventricle has severely decreased function. The left ventricular internal cavity size was moderately dilated. There is mild left  ventricular hypertrophy. Left ventricular diastolic parameters are indeterminate.  2. Right ventricular systolic function is normal. The right ventricular size is normal. There is mildly elevated pulmonary artery systolic pressure.  3. Mild mitral valve regurgitation.  4. AV is thickened, calcified with restricted motion. Peak and mean gradients through the valve aer 33 and 18 mm Hg respectively. These gradients are low due to SVI being 15. AVA (VTI) is 0.6 cm2 Dimensionless index is 0.21 consistent with severe AS. Compared to echo from Nov 2022, mean gradient is increased and dimensionless index is now in severe range (0.41 to 0.21).. Aortic valve regurgitation is not  visualized.  5. Aortic dilatation noted. There is mild dilatation of the ascending aorta, measuring 38 mm.  6. The inferior vena cava is normal in size with greater than 50% respiratory variability, suggesting right atrial pressure of 3 mmHg. FINDINGS  Left Ventricle: LVEF is depressed with severe hypokinesis of the inferior, distal anterior, apical, distal lateral, septal walls. Compared to images fro echo in Nov 2022, no significant change. Left ventricular ejection fraction, by estimation, is 30%%.  The left ventricle has severely decreased function. The left ventricular internal cavity size was moderately dilated. There is mild left ventricular hypertrophy. Left ventricular diastolic parameters are indeterminate. Right Ventricle: The right ventricular size is normal. Right vetricular wall thickness was not assessed. Right ventricular systolic function is normal. There is mildly elevated pulmonary artery systolic pressure. The tricuspid regurgitant velocity is 2.96 m/s, and with an assumed right atrial pressure of 3 mmHg, the estimated right ventricular systolic pressure is 26.3 mmHg. Left Atrium: Left atrial size was normal in size. Right Atrium: Right atrial size was normal in size. Pericardium: There is no evidence of pericardial effusion. Mitral Valve: There is mild thickening of the mitral valve leaflet(s). Mild mitral valve regurgitation. Tricuspid Valve: The tricuspid valve is normal in structure. Tricuspid valve regurgitation is mild. Aortic Valve: AV is thickened, calcified with restricted motion. Peak and mean gradients through the valve aer 33 and 18 mm Hg respectively. These gradients are low due to SVI being 15. AVA (VTI) is 0.6 cm2 Dimensionless index is 0.21 consistent with severe AS. Compared to echo from Nov 2022, mean gradient is increased and dimensionless index is now in severe range (0.41 to 0.21). Aortic valve regurgitation is not visualized. Aortic valve mean gradient measures 18.0 mmHg.  Aortic valve peak gradient measures 30.8 mmHg. Aortic valve area, by VTI measures 0.60 cm. Pulmonic Valve: The pulmonic valve was not well visualized. Pulmonic valve regurgitation is not visualized. Aorta: The aortic root is normal in size and structure and aortic dilatation noted. There is mild dilatation of the ascending aorta, measuring 38 mm. Venous: The inferior vena cava is normal in size with greater than 50% respiratory variability, suggesting right atrial pressure of 3 mmHg. IAS/Shunts: The interatrial septum was not assessed.  LEFT VENTRICLE PLAX 2D LVIDd:         5.50 cm LVIDs:         4.60 cm LV PW:         1.20 cm LV IVS:        0.90 cm LVOT diam:     1.90 cm      3D Volume EF: LV SV:         25           3D EF:        31 % LV SV Index:   15  LV EDV:       185 ml LVOT Area:     2.84 cm     LV ESV:       127 ml                             LV SV:        58 ml  LV Volumes (MOD) LV vol d, MOD A2C: 112.0 ml LV vol d, MOD A4C: 120.0 ml LV vol s, MOD A2C: 73.2 ml LV vol s, MOD A4C: 80.5 ml LV SV MOD A2C:     38.8 ml LV SV MOD A4C:     120.0 ml LV SV MOD BP:      40.5 ml RIGHT VENTRICLE RV Basal diam:  3.40 cm LEFT ATRIUM             Index        RIGHT ATRIUM           Index LA diam:        4.00 cm 2.44 cm/m   RA Area:     15.90 cm LA Vol (A2C):   84.0 ml 51.16 ml/m  RA Volume:   40.60 ml  24.73 ml/m LA Vol (A4C):   35.9 ml 21.86 ml/m LA Biplane Vol: 57.7 ml 35.14 ml/m  AORTIC VALVE AV Area (Vmax):    0.73 cm AV Area (Vmean):   0.67 cm AV Area (VTI):     0.60 cm AV Vmax:           277.50 cm/s AV Vmean:          199.500 cm/s AV VTI:            0.418 m AV Peak Grad:      30.8 mmHg AV Mean Grad:      18.0 mmHg LVOT Vmax:         71.00 cm/s LVOT Vmean:        47.000 cm/s LVOT VTI:          0.089 m LVOT/AV VTI ratio: 0.21  AORTA Ao Root diam: 3.40 cm Ao Asc diam:  3.80 cm TRICUSPID VALVE TR Peak grad:   35.0 mmHg TR Vmax:        296.00 cm/s  SHUNTS Systemic VTI:  0.09 m Systemic Diam: 1.90 cm  Dorris Carnes MD Electronically signed by Dorris Carnes MD Signature Date/Time: 08/18/2021/6:40:48 PM    Final                LOS: 2 days   Jennye Boroughs  [facility]   Pager on [email or web address]. If 7PM-7AM, please contact night-coverage at [email or web address]     08/19/2021, 12:49 PM

## 2021-08-19 NOTE — Progress Notes (Signed)
Patient given a total of 10mg  of IV Ativan since 08/18/2021 at 1900. Patient frequently getting up, pulling at lines. Patient fully dressed sitting on the side of the bed. Refusing to stay in bed. Patient yelling at nursing staff "Im going to [facility] to get me a damn beer". Patient got up out of bed and began drinking the hand sanitizer off the wall. Unable to manage patient at this time. Attempted to sit with patient, dim lights, reduce stimulation with no change in patient behavior. Notified Dr. Clearence Ped. Order for Haldol 5mg  IV once ordered. Patient kicking and attempting to hit nursing staff. Restraints ordered. Security notified and at bedside. AC notified.

## 2021-08-19 NOTE — Progress Notes (Signed)
Patient was placed on Precedex starting at 4. Patient was calm and said he would stay in bed so we removed restraints for a trial period. At 1310 Patient decided he was going to get dress and still wants to go home. He is awake and answers all questions correctly. However due to order to help keep patient here and calm precedex was increased to allow for comfort.
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
## 2021-08-19 NOTE — Progress Notes (Signed)
Called and gave report to ICU nurse. Transported pt to ICU via bed by this nurse and staff to room 9. Pt still in restraints.

## 2021-08-19 NOTE — Progress Notes (Signed)
°   08/19/21 0149  Restraint Order  Length of Order Daily  Assessment  Less Restrictive Interventions Attempted Yes  Health history reviewed prior to applying restraint Yes  Justification  Clinical Justification Pulling lines;Pulling tubes;Removal of equipment;Removal of dressing  Plan to Progress Out Continue safety plan;Interdisciplinary collaboration  Education  Discontinuation Criteria Follows instructions;No longer interfering with care  Discontinuation Criteria Explained Yes  Family Notification Family not available at this time  Restraint Every 2 Hour Monitoring  Airway Clear with Spontaneous Respirations Yes  Circulation / Skin Integrity No signs of injury  Emotional / Mental Status Agitated/restless;Confused;Verbally abusive  Range of Motion Performed  Food and Fluids Patient declined  Elimination External urinary catheter  Patient's rights, dignity, safety maintained Yes  Can Restraints be Less Restrictive or Discontinued? No  Medical Device Prophylactic Dressing Under Restraints  Skin Assessed Under All Medical Device Prophylactic Dressings (if applicable) Done - Skin Intact  Non-violent Restraints  Soft Restraint Right Wrist Start  Soft Restraint Left Wrist Start

## 2021-08-22 LAB — CULTURE, BLOOD (ROUTINE X 2)
Culture: NO GROWTH
Culture: NO GROWTH
Special Requests: ADEQUATE

## 2021-10-05 ENCOUNTER — Encounter (HOSPITAL_COMMUNITY): Payer: Self-pay

## 2021-10-05 ENCOUNTER — Inpatient Hospital Stay (HOSPITAL_COMMUNITY)
Admission: EM | Admit: 2021-10-05 | Discharge: 2021-10-07 | DRG: 291 | Disposition: A | Discharge status: Home / Self Care | Payer: Medicaid Other | Attending: Family Medicine | Admitting: Family Medicine

## 2021-10-05 ENCOUNTER — Emergency Department (HOSPITAL_COMMUNITY): Payer: Medicaid Other

## 2021-10-05 ENCOUNTER — Other Ambulatory Visit: Payer: Self-pay

## 2021-10-05 DIAGNOSIS — E876 Hypokalemia: Secondary | ICD-10-CM | POA: Diagnosis present

## 2021-10-05 DIAGNOSIS — F1010 Alcohol abuse, uncomplicated: Secondary | ICD-10-CM | POA: Diagnosis present

## 2021-10-05 DIAGNOSIS — E119 Type 2 diabetes mellitus without complications: Secondary | ICD-10-CM

## 2021-10-05 DIAGNOSIS — E871 Hypo-osmolality and hyponatremia: Secondary | ICD-10-CM | POA: Diagnosis present

## 2021-10-05 DIAGNOSIS — F419 Anxiety disorder, unspecified: Secondary | ICD-10-CM | POA: Diagnosis present

## 2021-10-05 DIAGNOSIS — I2510 Atherosclerotic heart disease of native coronary artery without angina pectoris: Secondary | ICD-10-CM | POA: Diagnosis present

## 2021-10-05 DIAGNOSIS — I426 Alcoholic cardiomyopathy: Secondary | ICD-10-CM

## 2021-10-05 DIAGNOSIS — F1410 Cocaine abuse, uncomplicated: Secondary | ICD-10-CM | POA: Diagnosis present

## 2021-10-05 DIAGNOSIS — K649 Unspecified hemorrhoids: Secondary | ICD-10-CM | POA: Diagnosis present

## 2021-10-05 DIAGNOSIS — I252 Old myocardial infarction: Secondary | ICD-10-CM

## 2021-10-05 DIAGNOSIS — F10239 Alcohol dependence with withdrawal, unspecified: Secondary | ICD-10-CM | POA: Diagnosis present

## 2021-10-05 DIAGNOSIS — R3915 Urgency of urination: Secondary | ICD-10-CM | POA: Diagnosis present

## 2021-10-05 DIAGNOSIS — F17210 Nicotine dependence, cigarettes, uncomplicated: Secondary | ICD-10-CM | POA: Diagnosis present

## 2021-10-05 DIAGNOSIS — Z9114 Patient's other noncompliance with medication regimen: Secondary | ICD-10-CM | POA: Diagnosis not present

## 2021-10-05 DIAGNOSIS — J440 Chronic obstructive pulmonary disease with acute lower respiratory infection: Secondary | ICD-10-CM | POA: Diagnosis present

## 2021-10-05 DIAGNOSIS — J431 Panlobular emphysema: Secondary | ICD-10-CM | POA: Diagnosis not present

## 2021-10-05 DIAGNOSIS — I429 Cardiomyopathy, unspecified: Secondary | ICD-10-CM | POA: Diagnosis present

## 2021-10-05 DIAGNOSIS — I5043 Acute on chronic combined systolic (congestive) and diastolic (congestive) heart failure: Secondary | ICD-10-CM | POA: Diagnosis present

## 2021-10-05 DIAGNOSIS — Z6822 Body mass index (BMI) 22.0-22.9, adult: Secondary | ICD-10-CM

## 2021-10-05 DIAGNOSIS — R350 Frequency of micturition: Secondary | ICD-10-CM | POA: Diagnosis present

## 2021-10-05 DIAGNOSIS — Z79899 Other long term (current) drug therapy: Secondary | ICD-10-CM

## 2021-10-05 DIAGNOSIS — Z8673 Personal history of transient ischemic attack (TIA), and cerebral infarction without residual deficits: Secondary | ICD-10-CM

## 2021-10-05 DIAGNOSIS — K7030 Alcoholic cirrhosis of liver without ascites: Secondary | ICD-10-CM | POA: Diagnosis present

## 2021-10-05 DIAGNOSIS — J441 Chronic obstructive pulmonary disease with (acute) exacerbation: Secondary | ICD-10-CM | POA: Diagnosis present

## 2021-10-05 DIAGNOSIS — R636 Underweight: Secondary | ICD-10-CM | POA: Diagnosis present

## 2021-10-05 DIAGNOSIS — J189 Pneumonia, unspecified organism: Secondary | ICD-10-CM | POA: Diagnosis present

## 2021-10-05 DIAGNOSIS — I509 Heart failure, unspecified: Secondary | ICD-10-CM

## 2021-10-05 DIAGNOSIS — Z20822 Contact with and (suspected) exposure to covid-19: Secondary | ICD-10-CM | POA: Diagnosis present

## 2021-10-05 DIAGNOSIS — J449 Chronic obstructive pulmonary disease, unspecified: Secondary | ICD-10-CM | POA: Diagnosis present

## 2021-10-05 DIAGNOSIS — Z7951 Long term (current) use of inhaled steroids: Secondary | ICD-10-CM | POA: Diagnosis not present

## 2021-10-05 DIAGNOSIS — Z91148 Patient's other noncompliance with medication regimen for other reason: Secondary | ICD-10-CM

## 2021-10-05 DIAGNOSIS — R0600 Dyspnea, unspecified: Secondary | ICD-10-CM

## 2021-10-05 LAB — BRAIN NATRIURETIC PEPTIDE: B Natriuretic Peptide: 3248 pg/mL — ABNORMAL HIGH (ref 0.0–100.0)

## 2021-10-05 LAB — URINALYSIS, ROUTINE W REFLEX MICROSCOPIC
Bilirubin Urine: NEGATIVE
Glucose, UA: NEGATIVE mg/dL
Ketones, ur: NEGATIVE mg/dL
Leukocytes,Ua: NEGATIVE
Nitrite: NEGATIVE
Protein, ur: NEGATIVE mg/dL
Specific Gravity, Urine: 1.003 — ABNORMAL LOW (ref 1.005–1.030)
pH: 7 (ref 5.0–8.0)

## 2021-10-05 LAB — RENAL FUNCTION PANEL
Albumin: 3.2 g/dL — ABNORMAL LOW (ref 3.5–5.0)
Anion gap: 9 (ref 5–15)
BUN: 10 mg/dL (ref 6–20)
CO2: 29 mmol/L (ref 22–32)
Calcium: 8.4 mg/dL — ABNORMAL LOW (ref 8.9–10.3)
Chloride: 93 mmol/L — ABNORMAL LOW (ref 98–111)
Creatinine, Ser: 0.62 mg/dL (ref 0.61–1.24)
GFR, Estimated: >60 mL/min (ref >60)
Glucose, Bld: 120 mg/dL — ABNORMAL HIGH (ref 70–99)
Phosphorus: 3.2 mg/dL (ref 2.5–4.6)
Potassium: 3.4 mmol/L — ABNORMAL LOW (ref 3.5–5.1)
Sodium: 131 mmol/L — ABNORMAL LOW (ref 135–145)

## 2021-10-05 LAB — BASIC METABOLIC PANEL
Anion gap: 12 (ref 5–15)
BUN: 8 mg/dL (ref 6–20)
CO2: 31 mmol/L (ref 22–32)
Calcium: 8.7 mg/dL — ABNORMAL LOW (ref 8.9–10.3)
Chloride: 87 mmol/L — ABNORMAL LOW (ref 98–111)
Creatinine, Ser: 0.61 mg/dL (ref 0.61–1.24)
GFR, Estimated: >60 mL/min (ref >60)
Glucose, Bld: 108 mg/dL — ABNORMAL HIGH (ref 70–99)
Potassium: 2.1 mmol/L — CL (ref 3.5–5.1)
Sodium: 130 mmol/L — ABNORMAL LOW (ref 135–145)

## 2021-10-05 LAB — RAPID URINE DRUG SCREEN, HOSP PERFORMED
Amphetamines: NOT DETECTED
Barbiturates: NOT DETECTED
Benzodiazepines: NOT DETECTED
Cocaine: POSITIVE — AB
Opiates: NOT DETECTED
Tetrahydrocannabinol: NOT DETECTED

## 2021-10-05 LAB — CBC WITH DIFFERENTIAL/PLATELET
Abs Immature Granulocytes: 0.02 10*3/uL (ref 0.00–0.07)
Basophils Absolute: 0 10*3/uL (ref 0.0–0.1)
Basophils Relative: 0 %
Eosinophils Absolute: 0 10*3/uL (ref 0.0–0.5)
Eosinophils Relative: 0 %
HCT: 38 % — ABNORMAL LOW (ref 39.0–52.0)
Hemoglobin: 13.6 g/dL (ref 13.0–17.0)
Immature Granulocytes: 0 %
Lymphocytes Relative: 12 %
Lymphs Abs: 0.7 10*3/uL (ref 0.7–4.0)
MCH: 33.6 pg (ref 26.0–34.0)
MCHC: 35.8 g/dL (ref 30.0–36.0)
MCV: 93.8 fL (ref 80.0–100.0)
Monocytes Absolute: 0.7 10*3/uL (ref 0.1–1.0)
Monocytes Relative: 12 %
Neutro Abs: 4.4 10*3/uL (ref 1.7–7.7)
Neutrophils Relative %: 76 %
Platelets: 198 10*3/uL (ref 150–400)
RBC: 4.05 MIL/uL — ABNORMAL LOW (ref 4.22–5.81)
RDW: 13.1 % (ref 11.5–15.5)
WBC: 5.8 10*3/uL (ref 4.0–10.5)
nRBC: 0 % (ref 0.0–0.2)

## 2021-10-05 LAB — RESP PANEL BY RT-PCR (FLU A&B, COVID) ARPGX2
Influenza A by PCR: NEGATIVE
Influenza B by PCR: NEGATIVE
SARS Coronavirus 2 by RT PCR: NEGATIVE

## 2021-10-05 LAB — MAGNESIUM: Magnesium: 1.4 mg/dL — ABNORMAL LOW (ref 1.7–2.4)

## 2021-10-05 MED ORDER — LOSARTAN POTASSIUM 50 MG PO TABS
25.0000 mg | ORAL_TABLET | Freq: Every day | ORAL | Status: DC
  Administered 2021-10-05 – 2021-10-06 (×2): 25 mg via ORAL
  Filled 2021-10-05 (×3): qty 1

## 2021-10-05 MED ORDER — ISOSORBIDE MONONITRATE ER 30 MG PO TB24
15.0000 mg | ORAL_TABLET | Freq: Every day | ORAL | Status: DC
  Administered 2021-10-05 – 2021-10-06 (×2): 15 mg via ORAL
  Filled 2021-10-05 (×3): qty 1

## 2021-10-05 MED ORDER — LORAZEPAM 1 MG PO TABS
1.0000 mg | ORAL_TABLET | ORAL | Status: DC | PRN
  Administered 2021-10-05: 1 mg via ORAL
  Administered 2021-10-06: 2 mg via ORAL
  Administered 2021-10-07: 3 mg via ORAL
  Filled 2021-10-05: qty 1
  Filled 2021-10-05: qty 3
  Filled 2021-10-05: qty 2

## 2021-10-05 MED ORDER — ADULT MULTIVITAMIN W/MINERALS CH
1.0000 | ORAL_TABLET | Freq: Every day | ORAL | Status: DC
  Administered 2021-10-05 – 2021-10-07 (×3): 1 via ORAL
  Filled 2021-10-05 (×3): qty 1

## 2021-10-05 MED ORDER — NICOTINE 21 MG/24HR TD PT24
21.0000 mg | MEDICATED_PATCH | Freq: Every day | TRANSDERMAL | Status: DC
  Administered 2021-10-05 – 2021-10-07 (×3): 21 mg via TRANSDERMAL
  Filled 2021-10-05 (×3): qty 1

## 2021-10-05 MED ORDER — SODIUM CHLORIDE 0.9 % IV SOLN
1.0000 g | INTRAVENOUS | Status: DC
Start: 2021-10-05 — End: 2021-10-07
  Administered 2021-10-05 – 2021-10-06 (×2): 1 g via INTRAVENOUS
  Filled 2021-10-05 (×2): qty 10

## 2021-10-05 MED ORDER — POTASSIUM CHLORIDE CRYS ER 20 MEQ PO TBCR
40.0000 meq | EXTENDED_RELEASE_TABLET | ORAL | Status: AC
Start: 2021-10-05 — End: 2021-10-05
  Administered 2021-10-05 (×2): 40 meq via ORAL
  Filled 2021-10-05 (×2): qty 2

## 2021-10-05 MED ORDER — ADULT MULTIVITAMIN W/MINERALS CH
1.0000 | ORAL_TABLET | Freq: Every day | ORAL | Status: DC
  Administered 2021-10-05 – 2021-10-06 (×2): 1 via ORAL
  Filled 2021-10-05 (×3): qty 1

## 2021-10-05 MED ORDER — SODIUM CHLORIDE 0.9 % IV SOLN
250.0000 mL | INTRAVENOUS | Status: DC | PRN
  Administered 2021-10-06: 250 mL via INTRAVENOUS

## 2021-10-05 MED ORDER — HYDRALAZINE HCL 25 MG PO TABS
25.0000 mg | ORAL_TABLET | Freq: Three times a day (TID) | ORAL | Status: DC
  Administered 2021-10-05 – 2021-10-06 (×2): 25 mg via ORAL
  Filled 2021-10-05 (×6): qty 1

## 2021-10-05 MED ORDER — POTASSIUM CHLORIDE 10 MEQ/100ML IV SOLN
10.0000 meq | INTRAVENOUS | Status: AC
  Administered 2021-10-05 (×4): 10 meq via INTRAVENOUS
  Filled 2021-10-05 (×4): qty 100

## 2021-10-05 MED ORDER — DM-GUAIFENESIN ER 30-600 MG PO TB12
1.0000 | ORAL_TABLET | Freq: Two times a day (BID) | ORAL | Status: DC
Start: 2021-10-05 — End: 2021-10-07
  Administered 2021-10-05 – 2021-10-07 (×4): 1 via ORAL
  Filled 2021-10-05 (×4): qty 1

## 2021-10-05 MED ORDER — POLYETHYLENE GLYCOL 3350 17 G PO PACK: 17.0000 g | PACK | Freq: Every day | ORAL | Status: DC | PRN

## 2021-10-05 MED ORDER — FLUTICASONE FUROATE-VILANTEROL 100-25 MCG/ACT IN AEPB
1.0000 | INHALATION_SPRAY | Freq: Every day | RESPIRATORY_TRACT | Status: DC
  Administered 2021-10-06: 1 via RESPIRATORY_TRACT
  Filled 2021-10-05: qty 28

## 2021-10-05 MED ORDER — THIAMINE HCL 100 MG/ML IJ SOLN
100.0000 mg | Freq: Every day | INTRAMUSCULAR | Status: DC
  Administered 2021-10-05: 100 mg via INTRAVENOUS
  Filled 2021-10-05: qty 2

## 2021-10-05 MED ORDER — SODIUM CHLORIDE 0.9% FLUSH: 3.0000 mL | INTRAVENOUS | Status: DC | PRN

## 2021-10-05 MED ORDER — MAGNESIUM SULFATE 2 GM/50ML IV SOLN: 2.0000 g | Freq: Once | INTRAVENOUS | Status: DC

## 2021-10-05 MED ORDER — ACETAMINOPHEN 325 MG PO TABS: 650.0000 mg | ORAL_TABLET | Freq: Four times a day (QID) | ORAL | Status: DC | PRN

## 2021-10-05 MED ORDER — POTASSIUM CHLORIDE 20 MEQ PO PACK
40.0000 meq | PACK | Freq: Once | ORAL | Status: AC
  Administered 2021-10-05: 40 meq via ORAL
  Filled 2021-10-05: qty 2

## 2021-10-05 MED ORDER — LORAZEPAM 2 MG/ML IJ SOLN
0.5000 mg | Freq: Once | INTRAMUSCULAR | Status: AC
  Administered 2021-10-05: 0.5 mg via INTRAVENOUS
  Filled 2021-10-05: qty 1

## 2021-10-05 MED ORDER — SODIUM CHLORIDE 0.9% FLUSH: 3.0000 mL | Freq: Two times a day (BID) | INTRAVENOUS | Status: DC

## 2021-10-05 MED ORDER — HYDROCORTISONE ACETATE 25 MG RE SUPP
25.0000 mg | Freq: Two times a day (BID) | RECTAL | Status: DC
  Administered 2021-10-05 – 2021-10-06 (×3): 25 mg via RECTAL
  Filled 2021-10-05 (×5): qty 1

## 2021-10-05 MED ORDER — SODIUM CHLORIDE 0.9% FLUSH
3.0000 mL | Freq: Two times a day (BID) | INTRAVENOUS | Status: DC
  Administered 2021-10-05 – 2021-10-07 (×3): 3 mL via INTRAVENOUS

## 2021-10-05 MED ORDER — MAGNESIUM SULFATE 4 GM/100ML IV SOLN
4.0000 g | Freq: Once | INTRAVENOUS | Status: AC
  Administered 2021-10-05: 4 g via INTRAVENOUS
  Filled 2021-10-05: qty 100

## 2021-10-05 MED ORDER — THIAMINE HCL 100 MG PO TABS
100.0000 mg | ORAL_TABLET | Freq: Every day | ORAL | Status: DC
  Administered 2021-10-05 – 2021-10-07 (×3): 100 mg via ORAL
  Filled 2021-10-05 (×3): qty 1

## 2021-10-05 MED ORDER — BISACODYL 10 MG RE SUPP: 10.0000 mg | Freq: Every day | RECTAL | Status: DC | PRN

## 2021-10-05 MED ORDER — ACETAMINOPHEN 650 MG RE SUPP: 650.0000 mg | Freq: Four times a day (QID) | RECTAL | Status: DC | PRN

## 2021-10-05 MED ORDER — FUROSEMIDE 10 MG/ML IJ SOLN
40.0000 mg | Freq: Once | INTRAMUSCULAR | Status: AC
  Administered 2021-10-05: 40 mg via INTRAVENOUS
  Filled 2021-10-05: qty 4

## 2021-10-05 MED ORDER — ASPIRIN EC 81 MG PO TBEC
81.0000 mg | DELAYED_RELEASE_TABLET | Freq: Every day | ORAL | Status: DC
Start: 2021-10-06 — End: 2021-10-07
  Administered 2021-10-06 – 2021-10-07 (×2): 81 mg via ORAL
  Filled 2021-10-05 (×2): qty 1

## 2021-10-05 MED ORDER — AZITHROMYCIN 500 MG IV SOLR
500.0000 mg | INTRAVENOUS | Status: DC
Start: 2021-10-05 — End: 2021-10-07
  Administered 2021-10-05 – 2021-10-06 (×2): 500 mg via INTRAVENOUS
  Filled 2021-10-05 (×2): qty 5

## 2021-10-05 MED ORDER — FOLIC ACID 1 MG PO TABS
1.0000 mg | ORAL_TABLET | Freq: Every day | ORAL | Status: DC
  Administered 2021-10-05 – 2021-10-07 (×3): 1 mg via ORAL
  Filled 2021-10-05 (×3): qty 1

## 2021-10-05 MED ORDER — DIPHENHYDRAMINE HCL 50 MG/ML IJ SOLN
50.0000 mg | Freq: Once | INTRAMUSCULAR | Status: AC
  Administered 2021-10-05: 50 mg via INTRAVENOUS
  Filled 2021-10-05: qty 1

## 2021-10-05 MED ORDER — ONDANSETRON HCL 4 MG PO TABS: 4.0000 mg | ORAL_TABLET | Freq: Four times a day (QID) | ORAL | Status: DC | PRN

## 2021-10-05 MED ORDER — ONDANSETRON HCL 4 MG/2ML IJ SOLN
4.0000 mg | Freq: Four times a day (QID) | INTRAMUSCULAR | Status: DC | PRN
Start: 2021-10-05 — End: 2021-10-07

## 2021-10-05 MED ORDER — OXYCODONE-ACETAMINOPHEN 5-325 MG PO TABS
1.0000 | ORAL_TABLET | Freq: Once | ORAL | Status: AC
  Administered 2021-10-05: 1 via ORAL
  Filled 2021-10-05: qty 1

## 2021-10-05 MED ORDER — ATORVASTATIN CALCIUM 40 MG PO TABS
40.0000 mg | ORAL_TABLET | Freq: Every day | ORAL | Status: DC
  Administered 2021-10-05 – 2021-10-07 (×3): 40 mg via ORAL
  Filled 2021-10-05 (×3): qty 1

## 2021-10-05 MED ORDER — CARVEDILOL 3.125 MG PO TABS
3.1250 mg | ORAL_TABLET | Freq: Two times a day (BID) | ORAL | Status: DC
  Administered 2021-10-05 – 2021-10-06 (×2): 3.125 mg via ORAL
  Filled 2021-10-05 (×5): qty 1

## 2021-10-05 MED ORDER — ALBUTEROL SULFATE (2.5 MG/3ML) 0.083% IN NEBU
2.5000 mg | INHALATION_SOLUTION | RESPIRATORY_TRACT | Status: DC | PRN
Start: 2021-10-05 — End: 2021-10-07

## 2021-10-05 MED ORDER — FUROSEMIDE 10 MG/ML IJ SOLN
40.0000 mg | Freq: Two times a day (BID) | INTRAMUSCULAR | Status: DC
  Administered 2021-10-05 – 2021-10-07 (×4): 40 mg via INTRAVENOUS
  Filled 2021-10-05 (×5): qty 4

## 2021-10-05 MED ORDER — LORAZEPAM 2 MG/ML IJ SOLN
1.0000 mg | INTRAMUSCULAR | Status: DC | PRN
  Administered 2021-10-05: 3 mg via INTRAVENOUS
  Administered 2021-10-06: 2 mg via INTRAVENOUS
  Administered 2021-10-06: 1 mg via INTRAVENOUS
  Administered 2021-10-07 (×4): 2 mg via INTRAVENOUS
  Administered 2021-10-07: 3 mg via INTRAVENOUS
  Filled 2021-10-05 (×5): qty 1
  Filled 2021-10-05: qty 2
  Filled 2021-10-05: qty 1
  Filled 2021-10-05: qty 2

## 2021-10-05 MED ORDER — ENOXAPARIN SODIUM 40 MG/0.4ML IJ SOSY
40.0000 mg | PREFILLED_SYRINGE | INTRAMUSCULAR | Status: DC
  Administered 2021-10-05 – 2021-10-06 (×2): 40 mg via SUBCUTANEOUS
  Filled 2021-10-05 (×2): qty 0.4

## 2021-10-05 NOTE — ED Notes (Signed)
Pt found in room yelling . Pt sitting on top of bed with urine all on the bottom of bed, and urine on the floor. Pt verbalized the liquid came from the IV pump. Michela Pitcher it was squirting out all over the place. Nurse assessed IV site and pump and everything running correctly. Pt yelling at nurse, you aren't doing anything for me . I am itching all over  , my butt hole is bothering me.  Dr. Darlyn Chamber of pt behaviors.

## 2021-10-05 NOTE — ED Notes (Signed)
Pt continues to take off vital sign attachments. redirection not effective.

## 2021-10-05 NOTE — ED Notes (Signed)
Pt is now resting. He continues to have bilateral leg jerking and itching.

## 2021-10-05 NOTE — Progress Notes (Signed)
°   10/05/21 1617  Vitals  Temp 98 F (36.7 C)  BP 108/83  MAP (mmHg) 93  BP Location Left Arm  BP Method Automatic  Patient Position (if appropriate) Lying  Pulse Rate (!) 115  Resp 19  Level of Consciousness  Level of Consciousness Alert  Oxygen Therapy  SpO2 96 %  O2 Device Room Air  O2 Flow Rate (L/min) 0 L/min  Pain Assessment  Pain Scale 0-10  Pain Score 0  Patients Stated Pain Goal 0  Glasgow Coma Scale  Eye Opening 4  Best Verbal Response (NON-intubated) 5  Best Motor Response 6  Glasgow Coma Scale Score 15  Provider Notification  Provider Name/Title Courage MD  Date Provider Notified 10/05/21  Time Provider Notified [id number]  Notification Type Face-to-face  Notification Reason Other (Comment)  Provider response See new orders  Date of Provider Response 10/05/21  Time of Provider Response 1640
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
## 2021-10-05 NOTE — ED Notes (Signed)
Pt caught sitting on foot of the bed swinging his legs trying to urinate off of bed. Nurse intervened and gave pt urinal. Pt needed extra redirection to use the urinal. 900 ml of urine obtained.

## 2021-10-05 NOTE — ED Provider Notes (Signed)
[facility] EMERGENCY DEPARTMENT Provider Note   CSN: [id number] Arrival date & time: 10/05/21  0932     History Chief Complaint  Patient presents with   Foot Pain    Francisco Robinson is a 61 y.o. male with history of diabetes, hepatitis C, MI, CHF, and cirrhosis who presents to the emergency department with right foot pain, leg pain, prostate issues, and hemorrhoids.  Patient has a remote history of multiple surgeries in the right leg and foot secondary to an injury in the past.  Patient states he has been having swelling and increased level of pain.  He denies any new injury.  Patient states he has been taking his Lasix.  With regards to his hemorrhoids, these have been going on for well over a year.  With regards to his prostate, patient states he is having urinary frequency and urgency and is using the restroom 15 times per night.  Patient denies any illicit drug use apart from marijuana but does admit to alcohol and tobacco use.   Foot Pain      Home Medications Prior to Admission medications   Medication Sig Start Date End Date Taking? Authorizing Provider  albuterol (VENTOLIN HFA) 108 (90 Base) MCG/ACT inhaler Inhale 2 puffs into the lungs every 6 (six) hours as needed for wheezing or shortness of breath. 06/19/21   Caren Griffins, MD  atorvastatin (LIPITOR) 40 MG tablet Take 1 tablet (40 mg total) by mouth daily. Patient not taking: Reported on 07/06/2021 06/19/21 07/19/21  Caren Griffins, MD  cefadroxil (DURICEF) 500 MG capsule Take 1 capsule (500 mg total) by mouth 2 (two) times daily. Patient not taking: Reported on 08/17/2021 07/07/21   Kathie Dike, MD  fluticasone furoate-vilanterol (BREO ELLIPTA) 100-25 MCG/ACT AEPB Inhale 1 puff into the lungs daily. Patient not taking: Reported on 08/17/2021 07/07/21   Kathie Dike, MD  folic acid (FOLVITE) 1 MG tablet Take 1 tablet (1 mg total) by mouth daily. Patient not taking: Reported on 08/17/2021 07/08/21   Kathie Dike, MD  furosemide (LASIX) 20 MG tablet Take 1 tablet (20 mg total) by mouth daily. Patient not taking: Reported on 07/06/2021 06/19/21   Caren Griffins, MD  guaiFENesin (MUCINEX) 600 MG 12 hr tablet Take 1 tablet (600 mg total) by mouth 2 (two) times daily. Patient not taking: Reported on 08/17/2021 07/07/21   Kathie Dike, MD  LORazepam (ATIVAN) 1 MG tablet Take 1 tablet (1 mg total) by mouth every 6 (six) hours as needed for anxiety. Patient not taking: Reported on 08/17/2021 07/07/21 07/07/22  Kathie Dike, MD  nicotine (NICODERM CQ - DOSED IN MG/24 HOURS) 21 mg/24hr patch Place 1 patch (21 mg total) onto the skin daily. Patient not taking: Reported on 07/06/2021 06/19/21   Caren Griffins, MD  predniSONE (DELTASONE) 10 MG tablet Take 40mg  po daily for 2 days then 30mg  daily for 2 days then 20mg  daily for 2 days then 10mg  daily for 2 days then stop Patient not taking: Reported on 08/17/2021 07/07/21   Kathie Dike, MD  thiamine 100 MG tablet Take 1 tablet (100 mg total) by mouth daily. Patient not taking: Reported on 08/17/2021 07/08/21   Kathie Dike, MD      Allergies    Patient has no known allergies.    Review of Systems   Review of Systems  All other systems reviewed and are negative.  Physical Exam Updated Vital Signs BP 106/82    Pulse (!) 115  Temp 97.8 F (36.6 C) (Oral)    Resp (!) 25    Ht 5\' 7"  (1.702 m)    Wt 65.8 kg    SpO2 96%    BMI 22.71 kg/m  Physical Exam Vitals and nursing note reviewed.  Constitutional:      General: He is not in acute distress.    Appearance: Normal appearance.  HENT:     Head: Normocephalic and atraumatic.  Eyes:     General:        Right eye: No discharge.        Left eye: No discharge.  Cardiovascular:     Comments: Regular rate and rhythm.  S1/S2 are distinct without any evidence of murmur, rubs, or gallops.  Radial pulses are 2+ bilaterally.  Dorsalis pedis pulses are 2+ bilaterally.   Pulmonary:     Comments:  Clear to auscultation bilaterally.  Normal effort.  No respiratory distress.  No evidence of wheezes, rales, or rhonchi heard throughout. Abdominal:     General: Abdomen is flat. Bowel sounds are normal. There is no distension.     Tenderness: There is no abdominal tenderness. There is no guarding or rebound.  Musculoskeletal:        General: Normal range of motion.     Cervical back: Neck supple.     Right lower leg: 1+ Pitting Edema present.     Left lower leg: Edema present.  Skin:    General: Skin is warm and dry.     Findings: No rash.  Neurological:     General: No focal deficit present.     Mental Status: He is alert.  Psychiatric:        Mood and Affect: Mood normal.        Behavior: Behavior normal.    ED Results / Procedures / Treatments   Labs (all labs ordered are listed, but only abnormal results are displayed) Labs Reviewed  CBC WITH DIFFERENTIAL/PLATELET - Abnormal; Notable for the following components:      Result Value   RBC 4.05 (*)    HCT 38.0 (*)    All other components within normal limits  BASIC METABOLIC PANEL - Abnormal; Notable for the following components:   Sodium 130 (*)    Potassium 2.1 (*)    Chloride 87 (*)    Glucose, Bld 108 (*)    Calcium 8.7 (*)    All other components within normal limits  BRAIN NATRIURETIC PEPTIDE - Abnormal; Notable for the following components:   B Natriuretic Peptide 3,248.0 (*)    All other components within normal limits  RESP PANEL BY RT-PCR (FLU A&B, COVID) [id number]  URINALYSIS, ROUTINE W REFLEX MICROSCOPIC  MAGNESIUM    EKG None  Radiology DG Chest 2 View  Result Date: 10/05/2021 CLINICAL DATA:  61 year old male with history of right-sided foot pain and swelling. EXAM: CHEST - 2 VIEW COMPARISON:  Chest x-ray 08/18/2021. FINDINGS: Opacity in the right lung base which may reflect atelectasis and/or consolidation, with superimposed small to moderate right pleural effusion. Left lung is clear. No left pleural  effusion. No pneumothorax. No evidence of pulmonary edema. Heart size is normal. Upper mediastinal contours are within normal limits. Atherosclerotic calcifications are noted in the thoracic aorta. IMPRESSION: 1. New atelectasis and/or consolidation in the right lung base with small right pleural effusion. 2. Aortic atherosclerosis. Electronically Signed   By: Vinnie Langton M.D.   On: 10/05/2021 11:06   DG Tibia/Fibula Right  Result Date: 10/05/2021  CLINICAL DATA:  61 year old male with history of right lower extremity pain and swelling. EXAM: RIGHT TIBIA AND FIBULA - 2 VIEW COMPARISON:  No priors. FINDINGS: AP and lateral views of the right tibia and fibula demonstrate old healed fractures of the proximal and distal thirds of the tibial and fibular diaphyses. Mild posttraumatic deformities are noted. No acute displaced fracture. Soft tissues are unremarkable. IMPRESSION: 1. No acute radiographic abnormality of the right tibia or fibula. 2. Old healed fractures of the proximal and distal thirds of the tibial and fibular diaphysis. Electronically Signed   By: Vinnie Langton M.D.   On: 10/05/2021 11:05   DG Foot Complete Right  Result Date: 10/05/2021 CLINICAL DATA:  61 year old male with history of right-sided foot pain and swelling for the past 3 days. EXAM: RIGHT FOOT COMPLETE - 3+ VIEW COMPARISON:  No priors. FINDINGS: Three views of the right foot demonstrate no acute displaced fracture, subluxation or dislocation. Multifocal degenerative changes are noted, most evident in the first MTP joint where there is joint space narrowing and subchondral sclerosis, compatible with osteoarthritis. IMPRESSION: 1. No acute radiographic abnormality of the right foot. Electronically Signed   By: Vinnie Langton M.D.   On: 10/05/2021 11:04    Procedures .Critical Care Performed by: Hendricks Limes, PA-C Authorized by: Hendricks Limes, PA-C   Critical care provider statement:    Critical care time  (minutes):  35   Critical care time was exclusive of:  Separately billable procedures and treating other patients   Critical care was necessary to treat or prevent imminent or life-threatening deterioration of the following conditions:  Metabolic crisis   Critical care was time spent personally by me on the following activities:  Blood draw for specimens, examination of patient, ordering and review of radiographic studies, ordering and review of laboratory studies, ordering and performing treatments and interventions and re-evaluation of patient's condition   I assumed direction of critical care for this patient from another provider in my specialty: no     Care discussed with: admitting provider      Medications Ordered in ED Medications  potassium chloride 10 mEq in 100 mL IVPB (10 mEq Intravenous New Bag/Given 10/05/21 1428)  potassium chloride SA (KLOR-CON M) CR tablet 40 mEq (has no administration in time range)  hydrocortisone (ANUSOL-HC) suppository 25 mg (has no administration in time range)  LORazepam (ATIVAN) tablet 1-4 mg (has no administration in time range)    Or  LORazepam (ATIVAN) injection 1-4 mg (has no administration in time range)  thiamine tablet 100 mg (has no administration in time range)    Or  thiamine (B-1) injection 100 mg (has no administration in time range)  folic acid (FOLVITE) tablet 1 mg (has no administration in time range)  multivitamin with minerals tablet 1 tablet (has no administration in time range)  furosemide (LASIX) injection 40 mg (has no administration in time range)  oxyCODONE-acetaminophen (PERCOCET/ROXICET) 5-325 MG per tablet 1 tablet (1 tablet Oral Given 10/05/21 1050)  furosemide (LASIX) injection 40 mg (40 mg Intravenous Given 10/05/21 1309)  potassium chloride (KLOR-CON) packet 40 mEq (40 mEq Oral Given 10/05/21 1308)  diphenhydrAMINE (BENADRYL) injection 50 mg (50 mg Intravenous Given 10/05/21 1311)  LORazepam (ATIVAN) injection 0.5 mg (0.5 mg  Intravenous Given 10/05/21 1425)    ED Course/ Medical Decision Making/ A&P Clinical Course as of 10/05/21 1446  Sun Oct 05, 2021  1441 I spoke with Dr. Denton Brick with [facility] hospital[facility] who agrees to admit the  patient.  [CF]    Clinical Course User Index [CF] Hendricks Limes, PA-C                           Medical Decision Making Amount and/or Complexity of Data Reviewed Labs: ordered. Radiology: ordered.  Risk Prescription drug management. Decision regarding hospitalization.   This patient presents to the ED for concern of right leg and right foot pain, this involves an extensive number of treatment options, and is a complaint that carries with it a high risk of complications and morbidity.  The differential diagnosis includes fracture dislocation, CHF exacerbation, infectious etiology, postsurgical pain.   Co morbidities that complicate the patient evaluation  Cirrhosis COPD Diabetes Alcohol abuse Hepatitis C   Additional history obtained:  Additional history obtained from old records. External records from outside source obtained and reviewed including old discharge summaries from January.  Seems to show that patient has a history of leaving [facility] for similar symptoms that he is presenting to today.   Lab Tests:  I Ordered, and personally interpreted labs.  The pertinent results include: CBC which was without any evidence of leukocytosis.  BMP was 3200.  COVID and flu were negative.  BMP showed profound hypokalemia.  Also showed hyponatremia and hypocalcemia.  Urinalysis is still pending.   Imaging Studies ordered:  I ordered imaging studies including x-rays of the right foot, right tibia/fibula, and a chest x-ray I independently visualized and interpreted imaging which showed no fracture or dislocation over the foot or leg.  Chest x-ray did show questionable new consolidation in the right lung I agree with the radiologist  interpretation   Cardiac Monitoring:  The patient was maintained on a cardiac monitor.  I personally viewed and interpreted the cardiac monitored which showed an underlying rhythm of: Tachycardic but regular   Medicines ordered and prescription drug management:  I ordered medication including Lasix for CHF exacerbation.  IV and p.o. potassium supplementation for hypokalemia.  Benadryl for itching.  Ativan for anxiety Reevaluation of the patient after these medicines showed that the patient improved I have reviewed the patients home medicines and have made adjustments as needed  Critical Interventions:  IV Lasix for adequate diuresis of volume overload in the setting of CHF exacerbation IV and p.o. potassium supplementation in the setting of hypokalemia   Problem List / ED Course:  Hypokalemia.  Patient given IV and p.o. potassium in the emergency department for repletion.  Patient will likely need admission for continued supplementation. CHF exacerbation.  Patient does not appear to be clinically volume overloaded on my exam.  Lungs do not sound wet on exam.  Does have some edema in the bilateral feet with 1+ pitting edema in the right lower extremity.  IV Lasix was given in the department to aid in diuresis. Rash.  This does not appear to be allergic in nature and he has a lot of well-healing wounds over the neck which she is continually itching.  IV Benadryl given with little improvement.  Patient still complaining of itching.  Ativan given for itching and anxiousness.  This seemed to help.   Reevaluation:  After the interventions noted above, I reevaluated the patient and found that they have :improved   Social Determinants of Health:  Alcohol abuse Patient tends to sign out [place] often   Dispostion:  After consideration of the diagnostic results and the patients response to treatment, I feel that the patent  would benefit from admission in the hospital for  further supplementation of his potassium and diuresis in the setting of heart failure exacerbation.  Final Clinical Impression(s) / ED Diagnoses Final diagnoses:  Acute on chronic congestive heart failure, unspecified heart failure type ([place])  Hypokalemia    Rx / DC Orders ED Discharge Orders     None         Hendricks Limes, [place] 10/05/21 1446    Milton Ferguson, MD 10/06/21 [phone]

## 2021-10-05 NOTE — ED Notes (Signed)
Edema and pain to right leg. Pedal pulse noted

## 2021-10-05 NOTE — ED Notes (Signed)
Attempted to get urine from Pt , pt unable to urinate.

## 2021-10-05 NOTE — Plan of Care (Signed)

## 2021-10-05 NOTE — ED Notes (Signed)
Pt continues to throw off his sheet which is covering up his penis. Nurse has redirected pt 2x since being in the room.

## 2021-10-05 NOTE — ED Triage Notes (Signed)
Pt c/o right foot pain x 3 days.

## 2021-10-05 NOTE — ED Notes (Signed)
Pt continues to yell " Oh God, Milagros Reap, She's Killing me lord" while pt is restless moving around in the bed. Pt begins to yell "ohhhh". When you ask why pt says because you are killing me. Nurse is standing beside pt bed charting.

## 2021-10-06 DIAGNOSIS — J431 Panlobular emphysema: Secondary | ICD-10-CM

## 2021-10-06 DIAGNOSIS — I426 Alcoholic cardiomyopathy: Secondary | ICD-10-CM | POA: Diagnosis not present

## 2021-10-06 DIAGNOSIS — I5043 Acute on chronic combined systolic (congestive) and diastolic (congestive) heart failure: Secondary | ICD-10-CM | POA: Diagnosis not present

## 2021-10-06 DIAGNOSIS — E119 Type 2 diabetes mellitus without complications: Secondary | ICD-10-CM | POA: Diagnosis not present

## 2021-10-06 LAB — CBC
HCT: 40.3 % (ref 39.0–52.0)
Hemoglobin: 14.1 g/dL (ref 13.0–17.0)
MCH: 33.7 pg (ref 26.0–34.0)
MCHC: 35 g/dL (ref 30.0–36.0)
MCV: 96.2 fL (ref 80.0–100.0)
Platelets: 199 10*3/uL (ref 150–400)
RBC: 4.19 MIL/uL — ABNORMAL LOW (ref 4.22–5.81)
RDW: 13.3 % (ref 11.5–15.5)
WBC: 5.5 10*3/uL (ref 4.0–10.5)
nRBC: 0 % (ref 0.0–0.2)

## 2021-10-06 LAB — COMPREHENSIVE METABOLIC PANEL
ALT: 32 U/L (ref 0–44)
AST: 82 U/L — ABNORMAL HIGH (ref 15–41)
Albumin: 3.1 g/dL — ABNORMAL LOW (ref 3.5–5.0)
Alkaline Phosphatase: 99 U/L (ref 38–126)
Anion gap: 11 (ref 5–15)
BUN: 9 mg/dL (ref 6–20)
CO2: 26 mmol/L (ref 22–32)
Calcium: 8.8 mg/dL — ABNORMAL LOW (ref 8.9–10.3)
Chloride: 96 mmol/L — ABNORMAL LOW (ref 98–111)
Creatinine, Ser: 0.57 mg/dL — ABNORMAL LOW (ref 0.61–1.24)
GFR, Estimated: >60 mL/min (ref >60)
Glucose, Bld: 94 mg/dL (ref 70–99)
Potassium: 3.6 mmol/L (ref 3.5–5.1)
Sodium: 133 mmol/L — ABNORMAL LOW (ref 135–145)
Total Bilirubin: 1.5 mg/dL — ABNORMAL HIGH (ref 0.3–1.2)
Total Protein: 6.3 g/dL — ABNORMAL LOW (ref 6.5–8.1)

## 2021-10-06 LAB — MAGNESIUM: Magnesium: 2.1 mg/dL (ref 1.7–2.4)

## 2021-10-06 MED ORDER — POTASSIUM CHLORIDE CRYS ER 20 MEQ PO TBCR
40.0000 meq | EXTENDED_RELEASE_TABLET | Freq: Once | ORAL | Status: AC
  Administered 2021-10-06: 40 meq via ORAL
  Filled 2021-10-06: qty 2

## 2021-10-06 NOTE — TOC Initial Note (Signed)
Transition of Care [facility]) - Initial/Assessment Note    Patient Details  Name: Francisco Robinson MRN: [id number] Date of Birth: 05-03-1961  [facility] CM/SW Contact:    Shade Flood, LCSW Phone Number: 10/06/2021, 2:04 PM  Clinical Narrative:                  Pt admitted from home. Pt known to TOC from previous admissions. TOC consulted for SA treatment resource provision and HF HH screen. Met with pt at bedside today to assess. Pt A&O x4 at the time of assessment. Pt states that he still lives alone and is independent in ADLs. He does not drive but has neighbors who help him out with a ride when he needs one. Reviewed resources for ETOH treatment and pt states that he has cut back on his consumption. He was agreeable to take the treatment resource list for AA and other counseling options. List provided to pt.  Pt reports that he has been doing better following CHF diet/fluid guidelines/weighing recommendations. He states that he has an appointment with a cardiologist on 11/06/21.   Pt is not anticipating any TOC needs for dc.  TOC will follow and continue to assess and assist with dc planning  Expected Discharge Plan: Home/Self Care Barriers to Discharge: Continued Medical Work up   Patient Goals and CMS Choice Patient states their goals for this hospitalization and ongoing recovery are:: go home today      Expected Discharge Plan and Services Expected Discharge Plan: Home/Self Care In-house Referral: Clinical Social Work     Living arrangements for the past 2 months: Mobile Home                                      Prior Living Arrangements/Services Living arrangements for the past 2 months: Mobile Home Lives with:: Self Patient language and need for interpreter reviewed:: Yes Do you feel safe going back to the place where you live?: Yes      Need for Family Participation in Patient Care: No (Comment) Care giver support system in place?: No  (comment) Current home services: DME Criminal Activity/Legal Involvement Pertinent to Current Situation/Hospitalization: No - Comment as needed  Activities of Daily Living Home Assistive Devices/Equipment: Eyeglasses ADL Screening (condition at time of admission) Patient's cognitive ability adequate to safely complete daily activities?: Yes Is the patient deaf or have difficulty hearing?: No Does the patient have difficulty seeing, even when wearing glasses/contacts?: No Does the patient have difficulty concentrating, remembering, or making decisions?: No Patient able to express need for assistance with ADLs?: No Does the patient have difficulty dressing or bathing?: No Independently performs ADLs?: Yes (appropriate for developmental age) Does the patient have difficulty walking or climbing stairs?: No Weakness of Legs: None Weakness of Arms/Hands: None  Permission Sought/Granted                  Emotional Assessment Appearance:: Appears stated age Attitude/Demeanor/Rapport: Engaged Affect (typically observed): Pleasant Orientation: : Oriented to Self, Oriented to Place, Oriented to  Time, Oriented to Situation Alcohol / Substance Use: Illicit Drugs, Alcohol Use Psych Involvement: No (comment)  Admission diagnosis:  Hypokalemia [E87.6] Acute exacerbation of CHF (congestive heart failure) ([place]) [I50.9] Acute on chronic congestive heart failure, unspecified heart failure type [facility]) [I50.9] Patient Active Problem List   Diagnosis Date Noted   Acute exacerbation of CHF (congestive heart failure) ([place]) 10/05/2021  COPD exacerbation (HCC) 08/17/2021  ° Noncompliance with medication regimen 08/17/2021  ° Cardiomyopathy (HCC) 07/07/2021  ° Pneumonia 07/05/2021  ° Delirium tremens/[place]co[place] Withdrawal 06/17/2021  ° Acute respiratory failure with hypoxia (HCC) 06/14/2021  ° History of stroke 06/14/2021  ° Seizure (HCC) 06/14/2021  ° History of MI (myocardial infarction) 06/14/2021  °  History of ETOH abuse 06/14/2021  ° Diabetes mellitus without complication (HCC) 06/14/2021  ° Hepatic cirrhosis (HCC) 06/11/2014  ° Constipation 06/11/2014  ° Alcohol abuse 07/15/2011  ° COPD (chronic obstructive pulmonary disease) (HCC) 07/15/2011  ° Chronic pain due to trauma 07/15/2011  ° °PCP:  Fanta, Tesfaye Demissie, MD °Pharmacy:   °[facility] - [place], [place] - [address] °[address] °[place] [address] °Phone: [phone] Fax: [phone] ° ° ° ° °Social Determinants of Health (SDOH) Interventions °  ° °Readmission Risk Interventions °Readmission Risk Prevention Plan 08/18/2021  °Transportation Screening Complete  °Home Care Screening Complete  °Medication Review (RN CM) Complete  °Some recent data might be hidden  ° ° ° °

## 2021-10-06 NOTE — Progress Notes (Signed)
Has been alert today and knows name, situation and where he is.  Very hungry this morning and ate breakfast and kept requesting more food.  BP just now 89/75 pulse 91 so held apresoline 25 and messaged Dr. Maurene Capes

## 2021-10-06 NOTE — Progress Notes (Addendum)
PROGRESS NOTE     Francisco Robinson, is a 61 y.o. male, DOB - 14-Dec-1960, ZMO:[id number]  Admit date - 10/05/2021   Admitting Physician Francisco Theiler Denton Brick, MD  Outpatient Primary MD for the patient is Fanta, [name], MD  LOS - 1  Chief Complaint  Patient presents with   Foot Pain        Brief Narrative:  61 y.o. 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   3)Polysubstance abuse--UDS is positive for cocaine again   4)EtOH abuse--- patient already having symptoms of DTs,  -Patient drinks 15 beers per day at times more -Last alcoholic intake more than 24 hours ago  --lorazepam per CIWA protocol, folic acid and multivitamin/thiamine as ordered -- 5)Hypomagnesemia/hypokalemia in the setting of alcohol abuse--- normalized after replaced   6)Hyponatremia--sodium is 130  >>133-- 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 -Continue IV fluids, avoid dehydration   7)Acute COPD Exacerbation/Tobacco abuse--- due to #1 #2 above -Management as above #1 and #2 -c/n Nicotine patch -Hold off on steroids   8)H/o CVA and CAD--- aspirin and  Lipitor for secondary stroke prevention --bisoprolol/HCTZ - Hold for systolic BP less than 035 mmhg or heart rate less than 60    9)History of Hep C and alcoholic liver cirrhosis--- ALT improving, AST stable   Disposition/Need for in-Hospital Stay- patient unable to be discharged at this time due to -acute combined systolic and diastolic CHF exacerbation requiring IV Lasix, pneumonia requiring IV antibiotic   Status is: Inpatient   Remains inpatient appropriate because:    Dispo: The patient is from: Home              Anticipated d/c is to: Home              Anticipated d/c date is: 2 days              Patient currently is not medically stable to d/c. Barriers: Not Clinically Stable-   Code Status :  -  Code Status: Full Code   Family Communication:    NA (patient is alert, awake and coherent)   DVT Prophylaxis  :   - SCDs   enoxaparin (LOVENOX) injection 40 mg Start: 10/05/21 1745 SCDs Start: 10/05/21 1644 Place TED hose Start: 10/05/21 1644   Lab Results  Component Value Date   PLT 199 10/06/2021    Inpatient Medications  Scheduled Meds:  aspirin EC  81 mg Oral Q breakfast   atorvastatin  40 mg Oral Daily   carvedilol  3.125 mg Oral BID WC   dextromethorphan-guaiFENesin  1 tablet Oral BID   enoxaparin (LOVENOX) injection  40 mg Subcutaneous Q24H   fluticasone furoate-vilanterol  1 puff Inhalation Daily   folic acid  1 mg Oral Daily  furosemide  40 mg Intravenous Q12H   hydrALAZINE  25 mg Oral Q8H   hydrocortisone  25 mg Rectal BID   isosorbide mononitrate  15 mg Oral Daily   losartan  25 mg Oral Daily   multivitamin with minerals  1 tablet Oral Daily   multivitamin with minerals  1 tablet Oral Daily   nicotine  21 mg Transdermal Daily   sodium chloride flush  3 mL Intravenous Q12H   sodium chloride flush  3 mL Intravenous Q12H   thiamine  100 mg Oral Daily   Or   thiamine  100 mg Intravenous Daily   Continuous Infusions:  sodium chloride 250 mL (10/06/21  1707)   azithromycin Stopped (10/06/21 0416)   cefTRIAXone (ROCEPHIN)  IV 1 g (10/06/21 1720)   PRN Meds:.sodium chloride, acetaminophen **OR** acetaminophen, albuterol, bisacodyl, LORazepam **OR** LORazepam, ondansetron **OR** ondansetron (ZOFRAN) IV, polyethylene glycol, sodium chloride flush   Anti-infectives (From admission, onward)    Start     Dose/Rate Route Frequency Ordered Stop   10/05/21 1730  cefTRIAXone (ROCEPHIN) 1 g in sodium chloride 0.9 % 100 mL IVPB        1 g 200 mL/hr over 30 Minutes Intravenous Every 24 hours 10/05/21 1641     10/05/21 1730  azithromycin (ZITHROMAX) 500 mg in sodium chloride 0.9 % 250 mL IVPB        500 mg 250 mL/hr over 60 Minutes Intravenous Every 24 hours 10/05/21 1641         Subjective: Francisco Robinson today has no fevers, no emesis,  No chest pain,   Cough and dyspnea persist  Objective: Vitals:   10/06/21 0717 10/06/21 0820 10/06/21 1332 10/06/21 1725  BP:   (!) 89/75 95/76  Pulse:   91 (!) 105  Resp:   19 16  Temp:   97.8 F (36.6 C) 98.3 F (36.8 C)  TempSrc:   Oral Oral  SpO2:  98% 100% 97%  Weight: 49.2 kg     Height:        Intake/Output Summary (Last 24 hours) at 10/06/2021 1830 Last data filed at 10/06/2021 1300 Gross per 24 hour  Intake 2034.14 ml  Output 3502 ml  Net -1467.86 ml   Filed Weights   10/05/21 0937 10/06/21 0717  Weight: 65.8 kg 49.2 kg   Physical Exam  Gen:- Awake Alert,  in no apparent distress  HEENT:- [place].AT, No sclera icterus, Eye--left eye vision loss Neck-Supple Neck,No JVD,.  Lungs-  diminished breath sounds , scattered wheezes CV- S1, S2 normal, regular  Abd-  +ve B.Sounds, Abd Soft, No tenderness,    Extremity/Skin:- +ve edema, pedal pulses present  Psych-affect is appropriate, oriented x3 Neuro-no new focal deficits, no tremors  Data Reviewed: I have personally reviewed following labs and imaging studies  CBC: Recent Labs  Lab 10/05/21 1110 10/06/21 0614  WBC 5.8 5.5   NEUTROABS 4.4  --   HGB 13.6 14.1  HCT 38.0* 40.3  MCV 93.8 96.2  PLT 198 737   Basic Metabolic Panel: Recent Labs  Lab 10/05/21 1110 10/05/21 1918 10/06/21 0614  NA 130* 131* 133*  K 2.1* 3.4* 3.6  CL 87* 93* 96*  CO2 31 29 26   GLUCOSE 108* 120* 94  BUN 8 10 9   CREATININE 0.61 0.62 0.57*  CALCIUM 8.7* 8.4* 8.8*  MG 1.4*  --  2.1  PHOS  --  3.2  --    GFR: Estimated Creatinine Clearance: 68.3 mL/min (A) (by C-G formula  based on SCr of 0.57 mg/dL (L)). Liver Function Tests: Recent Labs  Lab 10/05/21 1918 10/06/21 0614  AST  --  82*  ALT  --  32  ALKPHOS  --  99  BILITOT  --  1.5*  PROT  --  6.3*  ALBUMIN 3.2* 3.1*   Cardiac Enzymes: No results for input(s): CKTOTAL, CKMB, CKMBINDEX, TROPONINI in the last 168 hours. BNP (last 3 results) No results for input(s): PROBNP in the last 8760 hours. HbA1C: No results for input(s): HGBA1C in the last 72 hours. Sepsis Labs: @LABRCNTIP (procalcitonin:4,lacticidven:4) ) Recent Results (from the past 240 hour(s))  Resp Panel by RT-PCR (Flu A&B, Covid) Nasopharyngeal Swab     Status: None   Collection Time: 10/05/21  1:20 PM   Specimen: Nasopharyngeal Swab; Nasopharyngeal(NP) swabs in vial transport medium  Result Value Ref Range Status   SARS Coronavirus 2 by RT PCR NEGATIVE NEGATIVE Final    Comment: (NOTE) SARS-CoV-2 target nucleic acids are NOT DETECTED.  The SARS-CoV-2 RNA is generally detectable in upper respiratory specimens during the acute phase of infection. The lowest concentration of SARS-CoV-2 viral copies this assay can detect is 138 copies/mL. A negative result does not preclude SARS-Cov-2 infection and should not be used as the sole basis for treatment or other patient management decisions. A negative result may occur with  improper specimen collection/handling, submission of specimen other than nasopharyngeal swab, presence of viral mutation(s) within the areas targeted by this assay, and inadequate  number of viral copies(<138 copies/mL). A negative result must be combined with clinical observations, patient history, and epidemiological information. The expected result is Negative.  Fact Sheet for Patients:  EntrepreneurPulse.com.au  Fact Sheet for Healthcare Providers:  IncredibleEmployment.be  This test is no t yet approved or cleared by the [place] FDA and  has been authorized for detection and/or diagnosis of SARS-CoV-2 by FDA under an Emergency Use Authorization (EUA). This EUA will remain  in effect (meaning this test can be used) for the duration of the COVID-19 declaration under Section 564(b)(1) of the Act, 21 U.S.C.section 360bbb-3(b)(1), unless the authorization is terminated  or revoked sooner.       Influenza A by PCR NEGATIVE NEGATIVE Final   Influenza B by PCR NEGATIVE NEGATIVE Final    Comment: (NOTE) The Xpert Xpress SARS-CoV-2/FLU/RSV plus assay is intended as an aid in the diagnosis of influenza from Nasopharyngeal swab specimens and should not be used as a sole basis for treatment. Nasal washings and aspirates are unacceptable for Xpert Xpress SARS-CoV-2/FLU/RSV testing.  Fact Sheet for Patients: EntrepreneurPulse.com.au  Fact Sheet for Healthcare Providers: IncredibleEmployment.be  This test is not yet approved or cleared by the [place] FDA and has been authorized for detection and/or diagnosis of SARS-CoV-2 by FDA under an Emergency Use Authorization (EUA). This EUA will remain in effect (meaning this test can be used) for the duration of the COVID-19 declaration under Section 564(b)(1) of the Act, 21 U.S.C. section 360bbb-3(b)(1), unless the authorization is terminated or revoked.  Performed at [facility], [address] [place], [place] [address]     Radiology Studies: DG Chest 2 View  Result Date: 10/05/2021 CLINICAL DATA:  61 year old male with history  of right-sided foot pain and swelling. EXAM: CHEST - 2 VIEW COMPARISON:  Chest x-ray 08/18/2021. FINDINGS: Opacity in the right lung base which may reflect atelectasis and/or consolidation, with superimposed small to moderate right pleural effusion. Left lung is clear. No left pleural effusion. No pneumothorax. No evidence of  pulmonary edema. Heart size is normal. Upper mediastinal contours are within normal limits. Atherosclerotic calcifications are noted in the thoracic aorta. IMPRESSION: 1. New atelectasis and/or consolidation in the right lung base with small right pleural effusion. 2. Aortic atherosclerosis. Electronically Signed   By: Vinnie Langton M.D.   On: 10/05/2021 11:06   DG Tibia/Fibula Right  Result Date: 10/05/2021 CLINICAL DATA:  61 year old male with history of right lower extremity pain and swelling. EXAM: RIGHT TIBIA AND FIBULA - 2 VIEW COMPARISON:  No priors. FINDINGS: AP and lateral views of the right tibia and fibula demonstrate old healed fractures of the proximal and distal thirds of the tibial and fibular diaphyses. Mild posttraumatic deformities are noted. No acute displaced fracture. Soft tissues are unremarkable. IMPRESSION: 1. No acute radiographic abnormality of the right tibia or fibula. 2. Old healed fractures of the proximal and distal thirds of the tibial and fibular diaphysis. Electronically Signed   By: Vinnie Langton M.D.   On: 10/05/2021 11:05   DG Foot Complete Right  Result Date: 10/05/2021 CLINICAL DATA:  61 year old male with history of right-sided foot pain and swelling for the past 3 days. EXAM: RIGHT FOOT COMPLETE - 3+ VIEW COMPARISON:  No priors. FINDINGS: Three views of the right foot demonstrate no acute displaced fracture, subluxation or dislocation. Multifocal degenerative changes are noted, most evident in the first MTP joint where there is joint space narrowing and subchondral sclerosis, compatible with osteoarthritis. IMPRESSION: 1. No acute  radiographic abnormality of the right foot. Electronically Signed   By: Vinnie Langton M.D.   On: 10/05/2021 11:04     Scheduled Meds:  aspirin EC  81 mg Oral Q breakfast   atorvastatin  40 mg Oral Daily   carvedilol  3.125 mg Oral BID WC   dextromethorphan-guaiFENesin  1 tablet Oral BID   enoxaparin (LOVENOX) injection  40 mg Subcutaneous Q24H   fluticasone furoate-vilanterol  1 puff Inhalation Daily   folic acid  1 mg Oral Daily   furosemide  40 mg Intravenous Q12H   hydrALAZINE  25 mg Oral Q8H   hydrocortisone  25 mg Rectal BID   isosorbide mononitrate  15 mg Oral Daily   losartan  25 mg Oral Daily   multivitamin with minerals  1 tablet Oral Daily   multivitamin with minerals  1 tablet Oral Daily   nicotine  21 mg Transdermal Daily   sodium chloride flush  3 mL Intravenous Q12H   sodium chloride flush  3 mL Intravenous Q12H   thiamine  100 mg Oral Daily   Or   thiamine  100 mg Intravenous Daily   Continuous Infusions:  sodium chloride 250 mL (10/06/21 1707)   azithromycin Stopped (10/06/21 0416)   cefTRIAXone (ROCEPHIN)  IV 1 g (10/06/21 1720)    LOS: 1 day   Roxan Hockey M.D on 10/06/2021 at 6:30 PM  Go to [email or web address] - for contact info  [facility] - Office  [phone]  If 7PM-7AM, please contact night-coverage [email or web address] Password [facility] 10/06/2021, 6:30 PM

## 2021-10-06 NOTE — Progress Notes (Signed)
°   10/06/21 1725  Assess: MEWS Score  Temp 98.3 F (36.8 C)  BP 95/76  Pulse Rate (!) 105  Resp 16  Level of Consciousness Alert  SpO2 97 %  O2 Device Room Air  Assess: MEWS Score  MEWS Temp 0  MEWS Systolic 1  MEWS Pulse 1  MEWS RR 0  MEWS LOC 0  MEWS Score 2  MEWS Score Color Yellow  Assess: if the MEWS score is Yellow or Red  Were vital signs taken at a resting state? Yes  Focused Assessment Change from prior assessment (see assessment flowsheet)  Early Detection of Sepsis Score *See Row Information* Low  MEWS guidelines implemented *See Row Information* No, other (Comment)  Treat  Pain Scale 0-10  Take Vital Signs  Increase Vital Sign Frequency  Yellow: Q 2hr X 2 then Q 4hr X 2, if remains yellow, continue Q 4hrs  Escalate  MEWS: Escalate Yellow: discuss with charge nurse/RN and consider discussing with provider and RRT  Notify: Charge Nurse/RN  Name of Charge Nurse/RN Notified Golden  Date Charge Nurse/RN Notified 10/06/21  Time Charge Nurse/RN Notified 1448  Notify: Provider  Provider Name/Title Dr. Maurene Capes  Date Provider Notified 10/06/21  Time Provider Notified 1725  Notification Type Page  Notification Reason Other (Comment) (coreg due but bp low, ordered to hold)  Provider response Other (Comment) (hold coreg due to bp)  Date of Provider Response 10/06/21  Time of Provider Response 1725  Document  Patient Outcome Not stable and remains on department  Progress note created (see row info) Yes

## 2021-10-07 ENCOUNTER — Inpatient Hospital Stay (HOSPITAL_COMMUNITY): Payer: Medicaid Other

## 2021-10-07 DIAGNOSIS — I5043 Acute on chronic combined systolic (congestive) and diastolic (congestive) heart failure: Secondary | ICD-10-CM | POA: Diagnosis not present

## 2021-10-07 DIAGNOSIS — J431 Panlobular emphysema: Secondary | ICD-10-CM | POA: Diagnosis not present

## 2021-10-07 DIAGNOSIS — I426 Alcoholic cardiomyopathy: Secondary | ICD-10-CM | POA: Diagnosis not present

## 2021-10-07 DIAGNOSIS — E119 Type 2 diabetes mellitus without complications: Secondary | ICD-10-CM | POA: Diagnosis not present

## 2021-10-07 LAB — RENAL FUNCTION PANEL
Albumin: 2.8 g/dL — ABNORMAL LOW (ref 3.5–5.0)
Anion gap: 10 (ref 5–15)
BUN: 20 mg/dL (ref 6–20)
CO2: 27 mmol/L (ref 22–32)
Calcium: 8.7 mg/dL — ABNORMAL LOW (ref 8.9–10.3)
Chloride: 96 mmol/L — ABNORMAL LOW (ref 98–111)
Creatinine, Ser: 0.82 mg/dL (ref 0.61–1.24)
GFR, Estimated: >60 mL/min (ref >60)
Glucose, Bld: 138 mg/dL — ABNORMAL HIGH (ref 70–99)
Phosphorus: 3.4 mg/dL (ref 2.5–4.6)
Potassium: 3.3 mmol/L — ABNORMAL LOW (ref 3.5–5.1)
Sodium: 133 mmol/L — ABNORMAL LOW (ref 135–145)

## 2021-10-07 MED ORDER — HYDROCORTISONE ACETATE 25 MG RE SUPP: 25.0000 mg | Freq: Two times a day (BID) | RECTAL | 0 refills | Status: DC

## 2021-10-07 MED ORDER — HYDRALAZINE HCL 25 MG PO TABS
25.0000 mg | ORAL_TABLET | Freq: Three times a day (TID) | ORAL | 1 refills | Status: DC
Start: 2021-10-07 — End: 2021-12-22

## 2021-10-07 MED ORDER — NICOTINE 21 MG/24HR TD PT24: 21.0000 mg | MEDICATED_PATCH | Freq: Every day | TRANSDERMAL | 0 refills | Status: DC

## 2021-10-07 MED ORDER — CARVEDILOL 3.125 MG PO TABS: 3.1250 mg | ORAL_TABLET | Freq: Two times a day (BID) | ORAL | 3 refills | Status: AC

## 2021-10-07 MED ORDER — SYMBICORT 160-4.5 MCG/ACT IN AERO: 1.0000 | INHALATION_SPRAY | Freq: Two times a day (BID) | RESPIRATORY_TRACT | 8 refills | Status: AC

## 2021-10-07 MED ORDER — FUROSEMIDE 20 MG PO TABS: 20.0000 mg | ORAL_TABLET | Freq: Every day | ORAL | 0 refills | Status: DC

## 2021-10-07 MED ORDER — ALBUTEROL SULFATE HFA 108 (90 BASE) MCG/ACT IN AERS: 2.0000 | INHALATION_SPRAY | Freq: Four times a day (QID) | RESPIRATORY_TRACT | 2 refills | Status: AC | PRN

## 2021-10-07 MED ORDER — CEFTRIAXONE SODIUM 2 G IJ SOLR
2.0000 g | INTRAMUSCULAR | Status: DC
Start: 2021-10-07 — End: 2021-10-07
  Administered 2021-10-07: 2 g via INTRAVENOUS
  Filled 2021-10-07: qty 20

## 2021-10-07 MED ORDER — FOLIC ACID 1 MG PO TABS: 1.0000 mg | ORAL_TABLET | Freq: Every day | ORAL | 1 refills | Status: DC

## 2021-10-07 MED ORDER — GUAIFENESIN ER 600 MG PO TB12: 600.0000 mg | ORAL_TABLET | Freq: Two times a day (BID) | ORAL | 0 refills | Status: DC

## 2021-10-07 MED ORDER — ADULT MULTIVITAMIN W/MINERALS CH: 1.0000 | ORAL_TABLET | Freq: Every day | ORAL | 3 refills | Status: AC

## 2021-10-07 MED ORDER — ASPIRIN 81 MG PO TBEC: 81.0000 mg | DELAYED_RELEASE_TABLET | Freq: Every day | ORAL | 4 refills | Status: AC

## 2021-10-07 MED ORDER — CEFDINIR 300 MG PO CAPS: 300.0000 mg | ORAL_CAPSULE | Freq: Two times a day (BID) | ORAL | 0 refills | Status: AC

## 2021-10-07 MED ORDER — POTASSIUM CHLORIDE CRYS ER 20 MEQ PO TBCR
40.0000 meq | EXTENDED_RELEASE_TABLET | ORAL | Status: AC
  Administered 2021-10-07 (×2): 40 meq via ORAL
  Filled 2021-10-07 (×2): qty 2

## 2021-10-07 MED ORDER — ISOSORBIDE MONONITRATE ER 30 MG PO TB24: 15.0000 mg | ORAL_TABLET | Freq: Every day | ORAL | 3 refills | Status: AC

## 2021-10-07 MED ORDER — DM-GUAIFENESIN ER 30-600 MG PO TB12: 1.0000 | ORAL_TABLET | Freq: Two times a day (BID) | ORAL | 0 refills | Status: AC

## 2021-10-07 MED ORDER — THIAMINE HCL 100 MG PO TABS: 100.0000 mg | ORAL_TABLET | Freq: Every day | ORAL | 4 refills | Status: AC

## 2021-10-07 MED ORDER — IOHEXOL 300 MG/ML  SOLN
75.0000 mL | Freq: Once | INTRAMUSCULAR | Status: AC | PRN
  Administered 2021-10-07: 75 mL via INTRAVENOUS

## 2021-10-07 MED ORDER — ATORVASTATIN CALCIUM 40 MG PO TABS
40.0000 mg | ORAL_TABLET | Freq: Every day | ORAL | 0 refills | Status: DC
Start: 2021-10-07 — End: 2021-12-22

## 2021-10-07 MED ORDER — LOSARTAN POTASSIUM 25 MG PO TABS: 25.0000 mg | ORAL_TABLET | Freq: Every day | ORAL | 3 refills | Status: DC

## 2021-10-07 MED ORDER — AZITHROMYCIN 500 MG IV SOLR
500.0000 mg | INTRAVENOUS | Status: DC
  Administered 2021-10-07: 500 mg via INTRAVENOUS
  Filled 2021-10-07: qty 5

## 2021-10-07 MED ORDER — AZITHROMYCIN 500 MG PO TABS
500.0000 mg | ORAL_TABLET | Freq: Every day | ORAL | 0 refills | Status: AC
Start: 2021-10-07 — End: 2021-10-12

## 2021-10-07 MED ORDER — PREDNISONE 20 MG PO TABS: 40.0000 mg | ORAL_TABLET | Freq: Every day | ORAL | 0 refills | Status: AC

## 2021-10-07 NOTE — TOC Transition Note (Signed)
Transition of Care [facility]) - CM/SW Discharge Note   Patient Details  Name: Kejuan Bekker MRN: [id number] Date of Birth: 10-Jul-1961  [facility] CM/SW Contact:  Shade Flood, LCSW Phone Number: 10/07/2021, 11:58 AM   Clinical Narrative:     Pt with order for dc today. There are no additional TOC needs for dc.  Final next level of care: Home/Self Care Barriers to Discharge: Barriers Resolved   Patient Goals and CMS Choice Patient states their goals for this hospitalization and ongoing recovery are:: go home today      Discharge Placement                       Discharge Plan and Services In-house Referral: Clinical Social Work                                   Social Determinants of Health (SDOH) Interventions     Readmission Risk Interventions Readmission Risk Prevention Plan 08/18/2021  Transportation Screening Complete  Home Care Screening Complete  Medication Review (RN CM) Complete  Some recent data might be hidden

## 2021-10-07 NOTE — Discharge Instructions (Signed)
1) outpatient drug and alcohol rehab strongly advised 2) please take medications as prescribed including antibiotics for your pneumonia 3) please follow-up to primary care physician in 1 to 2 weeks for recheck and reevaluation for your pneumonia 4) avoid excessive salt intake due to congestive heart failure concerns, Very low-salt diet advised 5)Weigh yourself daily, call if you gain more than 3 pounds in 1 day or more than 5 pounds in 1 week as your diuretic medications may need to be adjusted 6)Limit your Fluid  intake to no more than 60 ounces (1.8 Liters) per day
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
## 2021-10-07 NOTE — Progress Notes (Signed)
Sister called to see if she could pick pt up at discharge. She is working and hasn't seen pt in years.

## 2021-10-28 ENCOUNTER — Inpatient Hospital Stay (HOSPITAL_COMMUNITY)
Admission: EM | Admit: 2021-10-28 | Discharge: 2021-10-30 | DRG: 291 | Discharge status: Against Medical Advice | Payer: Medicaid Other | Attending: Family Medicine | Admitting: Family Medicine

## 2021-10-28 ENCOUNTER — Other Ambulatory Visit: Payer: Self-pay

## 2021-10-28 ENCOUNTER — Emergency Department (HOSPITAL_COMMUNITY): Payer: Medicaid Other

## 2021-10-28 ENCOUNTER — Encounter (HOSPITAL_COMMUNITY): Payer: Self-pay | Admitting: *Deleted

## 2021-10-28 DIAGNOSIS — B1920 Unspecified viral hepatitis C without hepatic coma: Secondary | ICD-10-CM | POA: Diagnosis present

## 2021-10-28 DIAGNOSIS — F1010 Alcohol abuse, uncomplicated: Secondary | ICD-10-CM | POA: Diagnosis present

## 2021-10-28 DIAGNOSIS — Z720 Tobacco use: Secondary | ICD-10-CM | POA: Diagnosis present

## 2021-10-28 DIAGNOSIS — Z8673 Personal history of transient ischemic attack (TIA), and cerebral infarction without residual deficits: Secondary | ICD-10-CM

## 2021-10-28 DIAGNOSIS — F17210 Nicotine dependence, cigarettes, uncomplicated: Secondary | ICD-10-CM | POA: Diagnosis present

## 2021-10-28 DIAGNOSIS — I252 Old myocardial infarction: Secondary | ICD-10-CM | POA: Diagnosis not present

## 2021-10-28 DIAGNOSIS — Z20822 Contact with and (suspected) exposure to covid-19: Secondary | ICD-10-CM | POA: Diagnosis present

## 2021-10-28 DIAGNOSIS — J9601 Acute respiratory failure with hypoxia: Secondary | ICD-10-CM | POA: Diagnosis present

## 2021-10-28 DIAGNOSIS — E871 Hypo-osmolality and hyponatremia: Secondary | ICD-10-CM | POA: Diagnosis present

## 2021-10-28 DIAGNOSIS — K7030 Alcoholic cirrhosis of liver without ascites: Secondary | ICD-10-CM | POA: Diagnosis present

## 2021-10-28 DIAGNOSIS — I110 Hypertensive heart disease with heart failure: Secondary | ICD-10-CM | POA: Diagnosis present

## 2021-10-28 DIAGNOSIS — D6959 Other secondary thrombocytopenia: Secondary | ICD-10-CM | POA: Diagnosis present

## 2021-10-28 DIAGNOSIS — I2510 Atherosclerotic heart disease of native coronary artery without angina pectoris: Secondary | ICD-10-CM | POA: Diagnosis present

## 2021-10-28 DIAGNOSIS — Z7951 Long term (current) use of inhaled steroids: Secondary | ICD-10-CM | POA: Diagnosis not present

## 2021-10-28 DIAGNOSIS — J441 Chronic obstructive pulmonary disease with (acute) exacerbation: Secondary | ICD-10-CM | POA: Diagnosis present

## 2021-10-28 DIAGNOSIS — E872 Acidosis, unspecified: Secondary | ICD-10-CM | POA: Diagnosis present

## 2021-10-28 DIAGNOSIS — Z79899 Other long term (current) drug therapy: Secondary | ICD-10-CM | POA: Diagnosis not present

## 2021-10-28 DIAGNOSIS — I5023 Acute on chronic systolic (congestive) heart failure: Secondary | ICD-10-CM | POA: Diagnosis not present

## 2021-10-28 DIAGNOSIS — E876 Hypokalemia: Secondary | ICD-10-CM | POA: Diagnosis present

## 2021-10-28 DIAGNOSIS — I5043 Acute on chronic combined systolic (congestive) and diastolic (congestive) heart failure: Principal | ICD-10-CM | POA: Diagnosis present

## 2021-10-28 DIAGNOSIS — E119 Type 2 diabetes mellitus without complications: Secondary | ICD-10-CM | POA: Diagnosis present

## 2021-10-28 DIAGNOSIS — K7460 Unspecified cirrhosis of liver: Secondary | ICD-10-CM | POA: Diagnosis present

## 2021-10-28 DIAGNOSIS — Z7982 Long term (current) use of aspirin: Secondary | ICD-10-CM | POA: Diagnosis not present

## 2021-10-28 HISTORY — DX: Heart failure, unspecified: I50.9

## 2021-10-28 LAB — URINALYSIS, ROUTINE W REFLEX MICROSCOPIC
Bacteria, UA: NONE SEEN
Bilirubin Urine: NEGATIVE
Glucose, UA: NEGATIVE mg/dL
Hgb urine dipstick: NEGATIVE
Ketones, ur: NEGATIVE mg/dL
Leukocytes,Ua: NEGATIVE
Nitrite: NEGATIVE
Protein, ur: 30 mg/dL — AB
Specific Gravity, Urine: 1.016 (ref 1.005–1.030)
pH: 5 (ref 5.0–8.0)

## 2021-10-28 LAB — BASIC METABOLIC PANEL
Anion gap: 9 (ref 5–15)
BUN: 8 mg/dL (ref 6–20)
CO2: 23 mmol/L (ref 22–32)
Calcium: 8 mg/dL — ABNORMAL LOW (ref 8.9–10.3)
Chloride: 88 mmol/L — ABNORMAL LOW (ref 98–111)
Creatinine, Ser: 0.49 mg/dL — ABNORMAL LOW (ref 0.61–1.24)
GFR, Estimated: >60 mL/min (ref >60)
Glucose, Bld: 82 mg/dL (ref 70–99)
Potassium: 3.7 mmol/L (ref 3.5–5.1)
Sodium: 120 mmol/L — ABNORMAL LOW (ref 135–145)

## 2021-10-28 LAB — TROPONIN I (HIGH SENSITIVITY)
Troponin I (High Sensitivity): 9 ng/L (ref <18)
Troponin I (High Sensitivity): 10 ng/L (ref <18)

## 2021-10-28 LAB — HEPATIC FUNCTION PANEL
ALT: 36 U/L (ref 0–44)
AST: 109 U/L — ABNORMAL HIGH (ref 15–41)
Albumin: 3.3 g/dL — ABNORMAL LOW (ref 3.5–5.0)
Alkaline Phosphatase: 148 U/L — ABNORMAL HIGH (ref 38–126)
Bilirubin, Direct: 0.6 mg/dL — ABNORMAL HIGH (ref 0.0–0.2)
Indirect Bilirubin: 0.7 mg/dL (ref 0.3–0.9)
Total Bilirubin: 1.3 mg/dL — ABNORMAL HIGH (ref 0.3–1.2)
Total Protein: 6.7 g/dL (ref 6.5–8.1)

## 2021-10-28 LAB — CBC
HCT: 35 % — ABNORMAL LOW (ref 39.0–52.0)
HCT: 36.3 % — ABNORMAL LOW (ref 39.0–52.0)
Hemoglobin: 12.4 g/dL — ABNORMAL LOW (ref 13.0–17.0)
Hemoglobin: 12.9 g/dL — ABNORMAL LOW (ref 13.0–17.0)
MCH: 33.1 pg (ref 26.0–34.0)
MCH: 32.7 pg (ref 26.0–34.0)
MCHC: 35.4 g/dL (ref 30.0–36.0)
MCHC: 35.5 g/dL (ref 30.0–36.0)
MCV: 93.3 fL (ref 80.0–100.0)
MCV: 92.1 fL (ref 80.0–100.0)
Platelets: 122 10*3/uL — ABNORMAL LOW (ref 150–400)
Platelets: 114 10*3/uL — ABNORMAL LOW (ref 150–400)
RBC: 3.75 MIL/uL — ABNORMAL LOW (ref 4.22–5.81)
RBC: 3.94 MIL/uL — ABNORMAL LOW (ref 4.22–5.81)
RDW: 14.2 % (ref 11.5–15.5)
RDW: 14.3 % (ref 11.5–15.5)
WBC: 5.5 10*3/uL (ref 4.0–10.5)
WBC: 5.7 10*3/uL (ref 4.0–10.5)
nRBC: 0 % (ref 0.0–0.2)
nRBC: 0 % (ref 0.0–0.2)

## 2021-10-28 LAB — RESP PANEL BY RT-PCR (FLU A&B, COVID) ARPGX2
Influenza A by PCR: NEGATIVE
Influenza B by PCR: NEGATIVE
SARS Coronavirus 2 by RT PCR: NEGATIVE

## 2021-10-28 LAB — BRAIN NATRIURETIC PEPTIDE: B Natriuretic Peptide: 2605 pg/mL — ABNORMAL HIGH (ref 0.0–100.0)

## 2021-10-28 LAB — MAGNESIUM: Magnesium: 1.5 mg/dL — ABNORMAL LOW (ref 1.7–2.4)

## 2021-10-28 LAB — TSH: TSH: 3.596 u[IU]/mL (ref 0.350–4.500)

## 2021-10-28 LAB — PHOSPHORUS: Phosphorus: 4.3 mg/dL (ref 2.5–4.6)

## 2021-10-28 LAB — CREATININE, SERUM
Creatinine, Ser: 0.48 mg/dL — ABNORMAL LOW (ref 0.61–1.24)
GFR, Estimated: >60 mL/min (ref >60)

## 2021-10-28 LAB — LACTIC ACID, PLASMA
Lactic Acid, Venous: 2.2 mmol/L (ref 0.5–1.9)
Lactic Acid, Venous: 1.9 mmol/L (ref 0.5–1.9)

## 2021-10-28 MED ORDER — SPIRONOLACTONE 25 MG PO TABS
25.0000 mg | ORAL_TABLET | Freq: Every day | ORAL | Status: DC
Start: 2021-10-28 — End: 2021-10-29
  Filled 2021-10-28 (×2): qty 1

## 2021-10-28 MED ORDER — ADULT MULTIVITAMIN W/MINERALS CH
1.0000 | ORAL_TABLET | Freq: Every day | ORAL | Status: DC
  Administered 2021-10-29 – 2021-10-30 (×2): 1 via ORAL
  Filled 2021-10-28 (×3): qty 1

## 2021-10-28 MED ORDER — ADULT MULTIVITAMIN W/MINERALS CH: 1.0000 | ORAL_TABLET | Freq: Every day | ORAL | Status: DC

## 2021-10-28 MED ORDER — LORAZEPAM 2 MG/ML IJ SOLN
0.0000 mg | Freq: Four times a day (QID) | INTRAMUSCULAR | Status: AC
  Administered 2021-10-28 – 2021-10-29 (×4): 2 mg via INTRAVENOUS
  Filled 2021-10-28 (×4): qty 1

## 2021-10-28 MED ORDER — METHYLPREDNISOLONE SODIUM SUCC 125 MG IJ SOLR
125.0000 mg | Freq: Once | INTRAMUSCULAR | Status: AC
  Administered 2021-10-28: 125 mg via INTRAVENOUS
  Filled 2021-10-28: qty 2

## 2021-10-28 MED ORDER — ONDANSETRON HCL 4 MG PO TABS: 4.0000 mg | ORAL_TABLET | Freq: Four times a day (QID) | ORAL | Status: DC | PRN

## 2021-10-28 MED ORDER — NICOTINE 21 MG/24HR TD PT24
21.0000 mg | MEDICATED_PATCH | Freq: Every day | TRANSDERMAL | Status: DC
  Administered 2021-10-29: 21 mg via TRANSDERMAL
  Filled 2021-10-28 (×3): qty 1

## 2021-10-28 MED ORDER — IPRATROPIUM-ALBUTEROL 0.5-2.5 (3) MG/3ML IN SOLN
3.0000 mL | Freq: Once | RESPIRATORY_TRACT | Status: AC
Start: 2021-10-28 — End: 2021-10-28
  Administered 2021-10-28: 3 mL via RESPIRATORY_TRACT
  Filled 2021-10-28: qty 3

## 2021-10-28 MED ORDER — METHYLPREDNISOLONE SODIUM SUCC 40 MG IJ SOLR
40.0000 mg | Freq: Two times a day (BID) | INTRAMUSCULAR | Status: DC
  Administered 2021-10-28 – 2021-10-30 (×4): 40 mg via INTRAVENOUS
  Filled 2021-10-28 (×4): qty 1

## 2021-10-28 MED ORDER — ENOXAPARIN SODIUM 40 MG/0.4ML IJ SOSY
40.0000 mg | PREFILLED_SYRINGE | INTRAMUSCULAR | Status: DC
Start: 2021-10-29 — End: 2021-10-30
  Administered 2021-10-29: 40 mg via SUBCUTANEOUS
  Filled 2021-10-28: qty 0.4

## 2021-10-28 MED ORDER — LORAZEPAM 1 MG PO TABS
1.0000 mg | ORAL_TABLET | ORAL | Status: DC | PRN
  Administered 2021-10-29: 1 mg via ORAL
  Administered 2021-10-29: 3 mg via ORAL
  Administered 2021-10-29: 2 mg via ORAL
  Administered 2021-10-30: 1 mg via ORAL
  Filled 2021-10-28 (×2): qty 2
  Filled 2021-10-28: qty 3

## 2021-10-28 MED ORDER — FUROSEMIDE 10 MG/ML IJ SOLN
20.0000 mg | Freq: Once | INTRAMUSCULAR | Status: AC
  Administered 2021-10-28: 20 mg via INTRAVENOUS
  Filled 2021-10-28: qty 2

## 2021-10-28 MED ORDER — FUROSEMIDE 10 MG/ML IJ SOLN
40.0000 mg | Freq: Two times a day (BID) | INTRAMUSCULAR | Status: DC
  Administered 2021-10-28 – 2021-10-30 (×3): 40 mg via INTRAVENOUS
  Filled 2021-10-28 (×4): qty 4

## 2021-10-28 MED ORDER — SODIUM CHLORIDE 0.9 % IV SOLN
1.0000 g | Freq: Once | INTRAVENOUS | Status: AC
  Administered 2021-10-28: 1 g via INTRAVENOUS
  Filled 2021-10-28: qty 10

## 2021-10-28 MED ORDER — LORAZEPAM 2 MG/ML IJ SOLN
1.0000 mg | INTRAMUSCULAR | Status: DC | PRN
  Administered 2021-10-30 (×3): 2 mg via INTRAVENOUS
  Filled 2021-10-28 (×3): qty 1

## 2021-10-28 MED ORDER — ATORVASTATIN CALCIUM 40 MG PO TABS
40.0000 mg | ORAL_TABLET | Freq: Every day | ORAL | Status: DC
  Administered 2021-10-29 – 2021-10-30 (×2): 40 mg via ORAL
  Filled 2021-10-28 (×3): qty 1

## 2021-10-28 MED ORDER — THIAMINE HCL 100 MG/ML IJ SOLN
100.0000 mg | Freq: Every day | INTRAMUSCULAR | Status: DC
  Administered 2021-10-28: 100 mg via INTRAVENOUS
  Filled 2021-10-28: qty 2

## 2021-10-28 MED ORDER — ASPIRIN EC 81 MG PO TBEC
81.0000 mg | DELAYED_RELEASE_TABLET | Freq: Every day | ORAL | Status: DC
  Administered 2021-10-29 – 2021-10-30 (×2): 81 mg via ORAL
  Filled 2021-10-28 (×3): qty 1

## 2021-10-28 MED ORDER — LORAZEPAM 2 MG/ML IJ SOLN: 0.0000 mg | Freq: Two times a day (BID) | INTRAMUSCULAR | Status: DC

## 2021-10-28 MED ORDER — IPRATROPIUM-ALBUTEROL 0.5-2.5 (3) MG/3ML IN SOLN
3.0000 mL | Freq: Four times a day (QID) | RESPIRATORY_TRACT | Status: DC
  Administered 2021-10-28 – 2021-10-30 (×8): 3 mL via RESPIRATORY_TRACT
  Filled 2021-10-28 (×8): qty 3

## 2021-10-28 MED ORDER — THIAMINE HCL 100 MG PO TABS
100.0000 mg | ORAL_TABLET | Freq: Every day | ORAL | Status: DC
  Administered 2021-10-29 – 2021-10-30 (×2): 100 mg via ORAL
  Filled 2021-10-28 (×2): qty 1

## 2021-10-28 MED ORDER — BUDESONIDE 0.5 MG/2ML IN SUSP
0.5000 mg | Freq: Two times a day (BID) | RESPIRATORY_TRACT | Status: DC
  Administered 2021-10-28 – 2021-10-30 (×4): 0.5 mg via RESPIRATORY_TRACT
  Filled 2021-10-28 (×4): qty 2

## 2021-10-28 MED ORDER — ALBUTEROL SULFATE (2.5 MG/3ML) 0.083% IN NEBU
2.5000 mg | INHALATION_SOLUTION | Freq: Once | RESPIRATORY_TRACT | Status: AC
  Administered 2021-10-28: 2.5 mg via RESPIRATORY_TRACT
  Filled 2021-10-28: qty 3

## 2021-10-28 MED ORDER — CARVEDILOL 3.125 MG PO TABS
3.1250 mg | ORAL_TABLET | Freq: Two times a day (BID) | ORAL | Status: DC
  Administered 2021-10-30: 3.125 mg via ORAL
  Filled 2021-10-28 (×4): qty 1

## 2021-10-28 MED ORDER — ACETAMINOPHEN 325 MG PO TABS
650.0000 mg | ORAL_TABLET | Freq: Four times a day (QID) | ORAL | Status: DC | PRN
Start: 2021-10-28 — End: 2021-10-30
  Administered 2021-10-29: 650 mg via ORAL

## 2021-10-28 MED ORDER — DOXYCYCLINE HYCLATE 100 MG PO TABS
100.0000 mg | ORAL_TABLET | Freq: Two times a day (BID) | ORAL | Status: DC
  Administered 2021-10-29 – 2021-10-30 (×3): 100 mg via ORAL
  Filled 2021-10-28 (×3): qty 1

## 2021-10-28 MED ORDER — LORAZEPAM 1 MG PO TABS: 0.0000 mg | ORAL_TABLET | Freq: Two times a day (BID) | ORAL | Status: DC

## 2021-10-28 MED ORDER — LORAZEPAM 1 MG PO TABS
0.0000 mg | ORAL_TABLET | Freq: Four times a day (QID) | ORAL | Status: AC
  Administered 2021-10-29: 2 mg via ORAL
  Filled 2021-10-28: qty 1
  Filled 2021-10-28: qty 2
  Filled 2021-10-28: qty 1

## 2021-10-28 MED ORDER — LOSARTAN POTASSIUM 50 MG PO TABS
25.0000 mg | ORAL_TABLET | Freq: Every day | ORAL | Status: DC
Start: 2021-10-28 — End: 2021-10-30
  Administered 2021-10-30: 25 mg via ORAL
  Filled 2021-10-28 (×3): qty 1

## 2021-10-28 MED ORDER — ONDANSETRON HCL 4 MG/2ML IJ SOLN: 4.0000 mg | Freq: Four times a day (QID) | INTRAMUSCULAR | Status: DC | PRN

## 2021-10-28 MED ORDER — ACETAMINOPHEN 650 MG RE SUPP: 650.0000 mg | Freq: Four times a day (QID) | RECTAL | Status: DC | PRN

## 2021-10-28 MED ORDER — DM-GUAIFENESIN ER 30-600 MG PO TB12
1.0000 | ORAL_TABLET | Freq: Two times a day (BID) | ORAL | Status: DC
  Administered 2021-10-29 – 2021-10-30 (×3): 1 via ORAL
  Filled 2021-10-28 (×4): qty 1

## 2021-10-28 MED ORDER — MAGNESIUM SULFATE 2 GM/50ML IV SOLN
2.0000 g | Freq: Once | INTRAVENOUS | Status: AC
  Administered 2021-10-28: 2 g via INTRAVENOUS
  Filled 2021-10-28: qty 50

## 2021-10-28 MED ORDER — FOLIC ACID 1 MG PO TABS
1.0000 mg | ORAL_TABLET | Freq: Every day | ORAL | Status: DC
  Administered 2021-10-29 – 2021-10-30 (×2): 1 mg via ORAL
  Filled 2021-10-28 (×3): qty 1

## 2021-10-28 MED ORDER — SODIUM CHLORIDE 0.9 % IV SOLN
500.0000 mg | INTRAVENOUS | Status: DC
  Administered 2021-10-28: 500 mg via INTRAVENOUS
  Filled 2021-10-28: qty 5

## 2021-10-28 MED ORDER — ARFORMOTEROL TARTRATE 15 MCG/2ML IN NEBU
15.0000 ug | INHALATION_SOLUTION | Freq: Two times a day (BID) | RESPIRATORY_TRACT | Status: DC
  Administered 2021-10-28 – 2021-10-30 (×4): 15 ug via RESPIRATORY_TRACT
  Filled 2021-10-28 (×4): qty 2

## 2021-10-28 MED ORDER — ENOXAPARIN SODIUM 30 MG/0.3ML IJ SOSY
30.0000 mg | PREFILLED_SYRINGE | INTRAMUSCULAR | Status: DC
  Administered 2021-10-28: 30 mg via SUBCUTANEOUS
  Filled 2021-10-28: qty 0.3

## 2021-10-28 MED ORDER — THIAMINE HCL 100 MG PO TABS: 100.0000 mg | ORAL_TABLET | Freq: Every day | ORAL | Status: DC

## 2021-10-28 NOTE — Assessment & Plan Note (Signed)
-  With concerns for beer potomania, fluid overload with heart failure exacerbation and decompensation with underlying cirrhosis. ?-We will check serum osmolality ?-Patient will be actively receiving treatment with diuresis ?-Closely follow electrolytes trend ?-Advised to stop drinking and maintain adequate nutrition. ?-Repeat basic metabolic panel in AM. ?

## 2021-10-28 NOTE — ED Notes (Signed)
Date and time results received: 10/28/21 2:08 PM ? ? ? ?Test: LA ?Critical Value: 2.2 ? ?Name of Provider Notified: Dr. Regenia Skeeter ? ?Orders Received? Or Actions Taken?: see orders ?

## 2021-10-28 NOTE — Assessment & Plan Note (Signed)
-   Cessation counseling provided ?-Patient at baseline drinking approximately 12 beers on daily basis ?-CIWA protocol has been initiated ?-No acute withdrawal symptoms currently ?-Thiamine and folic acid has been ordered. ?

## 2021-10-28 NOTE — ED Triage Notes (Signed)
Pt brought in by [facility] from home with c/o SOB. EMS reports pt's O2 sat was 100% but his lungs sounded very tight with expiratory wheezing so they gave him Albuterol '5mg'$ . BP 126/84, HR 101, RR 29, CO2 20 for EMS.  ?

## 2021-10-28 NOTE — Assessment & Plan Note (Addendum)
-  Tobacco cessation has been recommended ?-Patient started on Brovana, Pulmicort, steroids, mucolytic's and 4 times daily DuoNeb. ?-Provide oxygen supplementation as needed and follow clinical response. ?-Solu-Medrol IV twice a day has been ordered. ?-Use of flutter valve encourage. ?-Unable to rule out component of bronchiectasis/community-acquired pneumonia; patient will receive treatment with doxycycline. ?

## 2021-10-28 NOTE — ED Notes (Signed)
Pt found to be soiled in urine and refuses to let staff help him get clean at this time. ? ?

## 2021-10-28 NOTE — Assessment & Plan Note (Signed)
-  In the setting of alcohol abuse ?-Patient is still actively drinking ?-Cessation counseling provided ?-Spironolactone has been added ?-Currently receiving treatment with Lasix (diuretic) for CHF exacerbation ?-Follow LFTs and clinical response. ?
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
## 2021-10-28 NOTE — Assessment & Plan Note (Addendum)
-  Recent echo demonstrating ejection fraction of 30% ?-Elevated BNP and chest x-ray with vascular congestion and interstitial edema ?-Continue to support oxygen supplementation as needed ?-Started on IV diuresis (Lasix) ?-Continue the use of Coreg and Cozaar; low dose spironolactone on daily basis has been added. ?-Follow daily weights, strict intake and output and low-sodium diet. ?-Checking TSH, magnesium and phosphorus level. ? ?
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
## 2021-10-28 NOTE — ED Notes (Signed)
Pt is non-compliant with keeping his arm straight for his medication. ? ?

## 2021-10-28 NOTE — Assessment & Plan Note (Signed)
-   Oxygen saturation in the low 90s on room air and; patient with increased work of breathing, difficulty speaking in full sentences and short winded sensation. ?-Continue treatment as already mentioned for CHF and COPD exacerbation. ?-Continue to wean off oxygen supplementation as tolerated ?-Tobacco cessation encouraged. ?

## 2021-10-28 NOTE — Assessment & Plan Note (Signed)
-  Cessation counseling has been provided ?-Nicotine patch ordered. ?

## 2021-10-28 NOTE — ED Provider Notes (Signed)
?[facility] ?Provider Note ? ? ?CSN: [id number] ?Arrival date & time: 10/28/21  1135 ? ?  ? ?History ? ?Chief Complaint  ?Patient presents with  ? Shortness of Breath  ? ? ?Francisco Robinson is a 61 y.o. male. ? ?HPI ?61 year old male with a history of CHF, COPD, diabetes, continued smoking, as well as alcohol abuse along with cirrhosis presents with shortness of breath.  He has been having shortness of breath for a couple days.  He reports a fever though does not give me a number.  He has been having chest pain, shortness of breath, productive cough (does not tell me the color when asked) as well as leg swelling/feet swelling.  Last drink alcohol last night.  Drinks about 12 beers.  This feels similar to prior exacerbations where he requires hospitalization.  EMS gave 5 mg albuterol. He's been complaining of vomiting since last night. He's complaining of his "kidneys hurting" as well.  He reports to his bilateral flanks as the area of pain. ? ?Home Medications ?Prior to Admission medications   ?Medication Sig Start Date End Date Taking? Authorizing Provider  ?albuterol (VENTOLIN HFA) 108 (90 Base) MCG/ACT inhaler Inhale 2 puffs into the lungs every 6 (six) hours as needed for wheezing or shortness of breath. 10/07/21  Yes Emokpae, Courage, MD  ?furosemide (LASIX) 20 MG tablet Take 1 tablet (20 mg total) by mouth daily. 10/07/21  Yes Roxan Hockey, MD  ?aspirin EC 81 MG EC tablet Take 1 tablet (81 mg total) by mouth daily with breakfast. Swallow whole. ?Patient not taking: Reported on 10/28/2021 10/08/21   Roxan Hockey, MD  ?atorvastatin (LIPITOR) 40 MG tablet Take 1 tablet (40 mg total) by mouth daily. 10/07/21 11/06/21  Roxan Hockey, MD  ?carvedilol (COREG) 3.125 MG tablet Take 1 tablet (3.125 mg total) by mouth 2 (two) times daily with a meal. 10/07/21   Emokpae, Courage, MD  ?dextromethorphan-guaiFENesin (MUCINEX DM) 30-600 MG 12hr tablet Take 1 tablet by mouth 2 (two) times daily. 10/07/21    Roxan Hockey, MD  ?folic acid (FOLVITE) 1 MG tablet Take 1 tablet (1 mg total) by mouth daily. 10/07/21   Roxan Hockey, MD  ?hydrALAZINE (APRESOLINE) 25 MG tablet Take 1 tablet (25 mg total) by mouth every 8 (eight) hours. 10/07/21   Roxan Hockey, MD  ?hydrocortisone (ANUSOL-HC) 25 MG suppository Place 1 suppository (25 mg total) rectally 2 (two) times daily. 10/07/21   Roxan Hockey, MD  ?isosorbide mononitrate (IMDUR) 30 MG 24 hr tablet Take 0.5 tablets (15 mg total) by mouth daily. 10/08/21   Roxan Hockey, MD  ?losartan (COZAAR) 25 MG tablet Take 1 tablet (25 mg total) by mouth daily. 10/08/21   Roxan Hockey, MD  ?Multiple Vitamin (MULTIVITAMIN WITH MINERALS) TABS tablet Take 1 tablet by mouth daily. 10/08/21   Roxan Hockey, MD  ?nicotine (NICODERM CQ - DOSED IN MG/24 HOURS) 21 mg/24hr patch Place 1 patch (21 mg total) onto the skin daily. 10/07/21   Roxan Hockey, MD  ?Dellis Anes 160-4.5 MCG/ACT inhaler Inhale 1 puff into the lungs 2 (two) times daily. 10/07/21   Roxan Hockey, MD  ?thiamine 100 MG tablet Take 1 tablet (100 mg total) by mouth daily. 10/07/21   Roxan Hockey, MD  ?   ? ?Allergies    ?Patient has no known allergies.   ? ?Review of Systems   ?Review of Systems  ?Constitutional:  Positive for fever.  ?Respiratory:  Positive for cough and shortness of breath.   ?Cardiovascular:  Positive for chest pain and leg swelling.  ?Gastrointestinal:  Positive for vomiting.  ?Genitourinary:  Positive for flank pain.  ? ?Physical Exam ?Updated Vital Signs ?BP (!) 143/102   Pulse (!) 111   Temp 97.7 ?F (36.5 ?C) (Oral)   Resp (!) 22   Ht '5\' 7"'$  (1.702 m)   Wt 49.2 kg   SpO2 100%   BMI 16.99 kg/m?  ?Physical Exam ?Vitals and nursing note reviewed.  ?Constitutional:   ?   Appearance: He is well-developed.  ?HENT:  ?   Head: Normocephalic and atraumatic.  ?Cardiovascular:  ?   Rate and Rhythm: Regular rhythm. Tachycardia present.  ?   Heart sounds: Normal heart sounds.  ?   Comments:  HR~100 ?Pulmonary:  ?   Effort: Pulmonary effort is normal. Tachypnea present. No accessory muscle usage or respiratory distress.  ?   Breath sounds: Wheezing present.  ?Abdominal:  ?   Palpations: Abdomen is soft.  ?   Tenderness: There is no abdominal tenderness. There is no right CVA tenderness or left CVA tenderness.  ?Musculoskeletal:  ?   Right lower leg: No edema.  ?   Left lower leg: No edema.  ?   Comments: No pedal, ankle, or lower leg edema  ?Skin: ?   General: Skin is warm and dry.  ?Neurological:  ?   Mental Status: He is alert.  ? ? ?ED Results / Procedures / Treatments   ?Labs ?(all labs ordered are listed, but only abnormal results are displayed) ?Labs Reviewed  ?BASIC METABOLIC PANEL - Abnormal; Notable for the following components:  ?    Result Value  ? Sodium 120 (*)   ? Chloride 88 (*)   ? Creatinine, Ser 0.49 (*)   ? Calcium 8.0 (*)   ? All other components within normal limits  ?CBC - Abnormal; Notable for the following components:  ? RBC 3.75 (*)   ? Hemoglobin 12.4 (*)   ? HCT 35.0 (*)   ? Platelets 122 (*)   ? All other components within normal limits  ?LACTIC ACID, PLASMA - Abnormal; Notable for the following components:  ? Lactic Acid, Venous 2.2 (*)   ? All other components within normal limits  ?HEPATIC FUNCTION PANEL - Abnormal; Notable for the following components:  ? Albumin 3.3 (*)   ? AST 109 (*)   ? Alkaline Phosphatase 148 (*)   ? Total Bilirubin 1.3 (*)   ? Bilirubin, Direct 0.6 (*)   ? All other components within normal limits  ?BRAIN NATRIURETIC PEPTIDE - Abnormal; Notable for the following components:  ? B Natriuretic Peptide 2,605.0 (*)   ? All other components within normal limits  ?URINALYSIS, ROUTINE W REFLEX MICROSCOPIC - Abnormal; Notable for the following components:  ? Protein, ur 30 (*)   ? All other components within normal limits  ?CBC - Abnormal; Notable for the following components:  ? RBC 3.94 (*)   ? Hemoglobin 12.9 (*)   ? HCT 36.3 (*)   ? Platelets 114 (*)   ?  All other components within normal limits  ?CREATININE, SERUM - Abnormal; Notable for the following components:  ? Creatinine, Ser 0.48 (*)   ? All other components within normal limits  ?MAGNESIUM - Abnormal; Notable for the following components:  ? Magnesium 1.5 (*)   ? All other components within normal limits  ?CULTURE, BLOOD (ROUTINE X 2)  ?CULTURE, BLOOD (ROUTINE X 2)  ?RESP PANEL BY RT-PCR (FLU A&B, COVID) [id number]  ?  LACTIC ACID, PLASMA  ?PHOSPHORUS  ?TSH  ?BASIC METABOLIC PANEL  ?CBC  ?TROPONIN I (HIGH SENSITIVITY)  ?TROPONIN I (HIGH SENSITIVITY)  ? ? ?EKG ?EKG Interpretation ? ?Date/Time:  Tuesday October 28 2021 12:08:12 EDT ?Ventricular Rate:  99 ?PR Interval:  196 ?QRS Duration: 100 ?QT Interval:  369 ?QTC Calculation: 474 ?R Axis:   86 ?Text Interpretation: Sinus rhythm Anterior infarct, old Nonspecific T abnormalities, lateral leads Baseline wander in lead(s) V4 no significant change since 2023 Confirmed by Sherwood Gambler [phone]) on 10/28/2021 1:35:11 PM ? ?Radiology ?DG Chest 2 View ? ?Result Date: 10/28/2021 ?CLINICAL DATA:  Shortness of breath.  CHF and COPD EXAM: CHEST - 2 VIEW COMPARISON:  10/07/2021 FINDINGS: Remote right rib fractures. Midline trachea. Mild cardiomegaly. Atherosclerosis in the transverse aorta. Moderate right and small left pleural effusions are new since the prior plain film. No pneumothorax. Pulmonary interstitial prominence and indistinctness, progressive. Right greater than left base airspace disease. IMPRESSION: Congestive heart failure with right greater than left pleural effusions. This is superimposed upon COPD/chronic bronchitis. Right greater than left base airspace disease, most likely atelectasis. At the right lung base, concurrent infection cannot be excluded. Aortic Atherosclerosis (ICD10-I70.0). Electronically Signed   By: Abigail Miyamoto M.D.   On: 10/28/2021 12:39   ? ?Procedures ?Marland KitchenCritical Care ?Performed by: Sherwood Gambler, MD ?Authorized by: Sherwood Gambler, MD   ? ?Critical care provider statement:  ?  Critical care time (minutes):  35 ?  Critical care time was exclusive of:  Separately billable procedures and treating other patients ?  Critical care was necessary to t

## 2021-10-29 LAB — BASIC METABOLIC PANEL
Anion gap: 13 (ref 5–15)
BUN: 12 mg/dL (ref 6–20)
CO2: 23 mmol/L (ref 22–32)
Calcium: 8.4 mg/dL — ABNORMAL LOW (ref 8.9–10.3)
Chloride: 93 mmol/L — ABNORMAL LOW (ref 98–111)
Creatinine, Ser: 0.53 mg/dL — ABNORMAL LOW (ref 0.61–1.24)
GFR, Estimated: >60 mL/min (ref >60)
Glucose, Bld: 181 mg/dL — ABNORMAL HIGH (ref 70–99)
Potassium: 3.4 mmol/L — ABNORMAL LOW (ref 3.5–5.1)
Sodium: 129 mmol/L — ABNORMAL LOW (ref 135–145)

## 2021-10-29 LAB — CBC
HCT: 43.4 % (ref 39.0–52.0)
Hemoglobin: 15 g/dL (ref 13.0–17.0)
MCH: 31.1 pg (ref 26.0–34.0)
MCHC: 34.6 g/dL (ref 30.0–36.0)
MCV: 90 fL (ref 80.0–100.0)
Platelets: 166 10*3/uL (ref 150–400)
RBC: 4.82 MIL/uL (ref 4.22–5.81)
RDW: 14 % (ref 11.5–15.5)
WBC: 3.4 10*3/uL — ABNORMAL LOW (ref 4.0–10.5)
nRBC: 0 % (ref 0.0–0.2)

## 2021-10-29 LAB — OSMOLALITY: Osmolality: 271 mOsm/kg — ABNORMAL LOW (ref 275–295)

## 2021-10-29 LAB — MAGNESIUM: Magnesium: 1.8 mg/dL (ref 1.7–2.4)

## 2021-10-29 LAB — LACTIC ACID, PLASMA: Lactic Acid, Venous: 1.5 mmol/L (ref 0.5–1.9)

## 2021-10-29 MED ORDER — MIDODRINE HCL 5 MG PO TABS
5.0000 mg | ORAL_TABLET | Freq: Three times a day (TID) | ORAL | Status: DC
  Administered 2021-10-29 – 2021-10-30 (×4): 5 mg via ORAL
  Filled 2021-10-29 (×5): qty 1

## 2021-10-29 MED ORDER — POTASSIUM CHLORIDE CRYS ER 20 MEQ PO TBCR
40.0000 meq | EXTENDED_RELEASE_TABLET | ORAL | Status: AC
  Administered 2021-10-29 (×2): 40 meq via ORAL
  Filled 2021-10-29 (×2): qty 2

## 2021-10-29 NOTE — Progress Notes (Signed)
?   10/29/21 0617  ?Vitals  ?Temp 98.1 ?F (36.7 ?C)  ?Temp Source Oral  ?BP 94/60  ?MAP (mmHg) 70  ?BP Location Left Arm  ?BP Method Automatic  ?Patient Position (if appropriate) Lying  ?Pulse Rate (!) 125  ?Pulse Rate Source Monitor  ?Resp 18  ?Level of Consciousness  ?Level of Consciousness Alert  ?MEWS COLOR  ?MEWS Score Color Yellow  ?Oxygen Therapy  ?SpO2 97 %  ?O2 Device Room Air  ?MEWS Score  ?MEWS Temp 0  ?MEWS Systolic 1  ?MEWS Pulse 2  ?MEWS RR 0  ?MEWS LOC 0  ?MEWS Score 3  ?Provider Notification  ?Provider Name/Title Dr. Clearence Ped  ?Date Provider Notified 10/29/21  ?Time Provider Notified [phone]  ?Notification Type Page  ?Notification Reason Other (Comment) ?(Notified of patients heart rate and other vitals)  ?Provider response See new orders  ? ?Patient resting in bed. Denies pain or distress at this time New orders placed for patient to start on midodrine.  ?

## 2021-10-29 NOTE — Progress Notes (Signed)
?   10/29/21 0956  ?Assess: MEWS Score  ?Temp 98.1 ?F (36.7 ?C)  ?BP 96/73  ?Pulse Rate (!) 109  ?Level of Consciousness Alert  ?SpO2 94 %  ?O2 Device Room Air  ?Assess: MEWS Score  ?MEWS Temp 0  ?MEWS Systolic 1  ?MEWS Pulse 1  ?MEWS RR 0  ?MEWS LOC 0  ?MEWS Score 2  ?MEWS Score Color Yellow  ?Assess: if the MEWS score is Yellow or Red  ?Were vital signs taken at a resting state? Yes  ?Focused Assessment No change from prior assessment  ?Lindia Garms Detection of Sepsis Score *See Row Information* Medium  ?MEWS guidelines implemented *See Row Information* No, previously yellow, continue vital signs every 4 hours  ?Treat  ?Pain Score 5  ?Pain Type Chronic pain  ?Pain Descriptors / Indicators Aching  ?Patients Stated Pain Goal 0  ?Breathing 0  ?Negative Vocalization 0  ?Facial Expression 0  ?Body Language 0  ?Take Vital Signs  ?Increase Vital Sign Frequency  Yellow: Q 2hr X 2 then Q 4hr X 2, if remains yellow, continue Q 4hrs  ?Escalate  ?MEWS: Escalate Yellow: discuss with charge nurse/RN and consider discussing with provider and RRT  ?Notify: Provider  ?Provider Name/Title MD Courage  ?Date Provider Notified 10/29/21  ?Time Provider Notified 1045  ?Provider response  ?(awaiting response)  ? ? ?

## 2021-10-29 NOTE — TOC Transition Note (Incomplete)
Transition of Care (TOC) - CM/SW Discharge Note ? ? ?Patient Details  ?Name: Francisco Robinson ?MRN: [id number] ?Date of Birth: Aug 07, 1961 ? ?Transition of Care (TOC) CM/SW Contact:  ?Khyren Hing D, LCSW ?Phone Number: ?10/29/2021, 1:46 PM ? ? ?Clinical Narrative:    ? ? ? ?  ?Barriers to Discharge: Continued Medical Work up ? ? ?Patient Goals and CMS Choice ?  ?  ?  ? ?Discharge Placement ?  ?           ?  ?  ?  ?  ? ?Discharge Plan and Services ?  ?  ?           ?  ?  ?  ?  ?  ?  ?  ?  ?  ?  ? ?Social Determinants of Health (SDOH) Interventions ?  ? ? ?Readmission Risk Interventions ? ?  08/18/2021  ?  1:43 PM  ?Readmission Risk Prevention Plan  ?Transportation Screening Complete  ?Home Care Screening Complete  ?Medication Review (RN CM) Complete  ? ? ? ? ? ?

## 2021-10-29 NOTE — Progress Notes (Signed)
?PROGRESS NOTE ? ? ? ? ?Francisco Robinson, is a 61 y.o. male, DOB - June 03, 1961, ION:[id number] ? ?Admit date - 10/28/2021   Admitting Physician Barton Dubois, MD ? ?Outpatient Primary MD for the patient is Fanta, [name], MD ? ?LOS - 1 ? ?Chief Complaint  ?Patient presents with  ? Shortness of Breath  ?    ? ? ?Brief Narrative:  ? ?61 y.o. male with medical history significant for CAD/prior MI, history of alcohol Abuse/DTs,DM2, COPD, ongoing tobacco abuse, and systolic dysfunction CHF, h/o prior CVA and prior  history of alcohol withdrawal related seizure, history of hep C and alcoholic liver cirrhosis admitted on 10/28/21 with CHF exacerbation ?  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 ? ?2)Acute COPD exacerbation/Tobaco Abuse- ?-Patient is not interested in smoking cessation ?-Continue IV Solu-Medrol, Brovana, Pulmicort, steroids, mucolytic's and DuoNeb. ?-Concerns for bronchiectasis, continue doxycycline ? ?3)Acute respiratory failure with hypoxia ([place]) ?- -Hypoxia improving with treatment and management of #1 and # 2 above ?-Weaning off oxygen nicely- ? ?4)Polysubstance abuse--H/o cocaine abuse ?-Patient not interested in rehab ? ?5)Hep C and Alcoholic Liver Cirrhosis- ?-Discontinue aldactone due to soft BP ?-Monitor LFTs ? ?6)Alcohol abuse ?-Not Interested in alcohol rehab ?-Patient at baseline drinking approximately 12 beers on daily basis ?-Continue lorazepam per CIWA protocol, continue thiamine and folic acid ? 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 ?-Avoid excessive free water ?- ?8)H/o CVA and CAD--- aspirin and Lipitor for secondary stroke prevention -- ?-Abstinence from cocaine and tobacco advised ?-Continue Coreg, losartan, isosorbide aspirin and Lipitor ? ?9) thrombocytopenia--related to direct toxic effect of alcohol on bone marrow compounded by underlying liver disease/cirrhosis ?-Consider holding Lovenox if platelets continue to drop ? ?Disposition/Need for in-Hospital Stay- patient unable to be discharged at this time due to --acute COPD and CHF-patient requiring IV steroids and IV diuretics* ? ?Status is: Inpatient  ? ?Disposition: The patient is from: Home ?             Anticipated d/c is to: Home ?             Anticipated d/c date is: 2 days ?             Patient currently is not medically stable to d/c. ?Barriers: Not Clinically Stable-  ? ?Code Status :  -  Code Status: Full Code  ? ?Family Communication:    NA (patient is alert, awake and coherent)  ? ?DVT Prophylaxis  :   - SCDs   enoxaparin (LOVENOX) injection 40 mg Start: 10/29/21 2200 ? ? ?Lab Results  ?Component Value Date  ? PLT 166 10/29/2021  ? ? ?Inpatient Medications ? ?Scheduled Meds: ? arformoterol  15 mcg Nebulization BID  ? aspirin EC  81 mg Oral Q breakfast  ? atorvastatin  40 mg Oral Daily  ? budesonide (PULMICORT) nebulizer solution  0.5 mg Nebulization BID  ? carvedilol  3.125 mg Oral BID WC  ? dextromethorphan-guaiFENesin  1 tablet Oral BID  ? doxycycline  100 mg Oral Q12H  ? enoxaparin (  LOVENOX) injection  40 mg Subcutaneous [id number]  ? folic acid  1 mg Oral Daily  ? furosemide  40 mg Intravenous Q12H  ? ipratropium-albuterol  3 mL Nebulization QID  ? LORazepam  0-4 mg Intravenous Q6H  ? Or  ? LORazepam  0-4 mg Oral Q6H  ? [START ON 10/30/2021] LORazepam  0-4 mg Intravenous Q12H  ? Or  ? [START ON 10/30/2021] LORazepam  0-4 mg Oral Q12H  ? losartan  25 mg Oral Daily   ? methylPREDNISolone (SOLU-MEDROL) injection  40 mg Intravenous Q12H  ? midodrine  5 mg Oral TID WC  ? multivitamin with minerals  1 tablet Oral Daily  ? nicotine  21 mg Transdermal Daily  ? potassium chloride  40 mEq Oral Q3H  ? spironolactone  25 mg Oral Daily  ? thiamine  100 mg Oral Daily  ? ?Continuous Infusions: ?PRN Meds:.acetaminophen **OR** acetaminophen, LORazepam **OR** LORazepam, ondansetron **OR** ondansetron (ZOFRAN) IV ? ? ?Anti-infectives (From admission, onward)  ? ? Start     Dose/Rate Route Frequency Ordered Stop  ? 10/29/21 0800  doxycycline (VIBRA-TABS) tablet 100 mg       ? 100 mg Oral Every 12 hours 10/28/21 1505    ? 10/28/21 1245  cefTRIAXone (ROCEPHIN) 1 g in sodium chloride 0.9 % 100 mL IVPB       ? 1 g ?200 mL/hr over 30 Minutes Intravenous  Once 10/28/21 1243 10/28/21 1640  ? 10/28/21 1245  azithromycin (ZITHROMAX) 500 mg in sodium chloride 0.9 % 250 mL IVPB  Status:  Discontinued       ? 500 mg ?250 mL/hr over 60 Minutes Intravenous Every 24 hours 10/28/21 1243 10/28/21 1505  ? ?  ? ?  ? ?Subjective: ?Francisco Robinson today has no fevers, no emesis,  No chest pain,   ?Voiding well ?-Cough is not worse ?-Hypoxia resolving ?-Dyspnea on exertion persist ? ? ?Objective: ?Vitals:  ? 10/29/21 5400 10/29/21 8676 10/29/21 1950 10/29/21 0956  ?BP:   107/75 96/73  ?Pulse:   (!) 110 (!) 109  ?Resp:      ?Temp:   (!) 97.5 ?F (36.4 ?C) 98.1 ?F (36.7 ?C)  ?TempSrc:   Oral Oral  ?SpO2:  98% 95% 94%  ?Weight: 52.7 kg     ?Height:      ? ? ?Intake/Output Summary (Last 24 hours) at 10/29/2021 1120 ?Last data filed at 10/29/2021 0600 ?Gross per 24 hour  ?Intake 339.44 ml  ?Output 3200 ml  ?Net -2860.56 ml  ? ?Filed Weights  ? 10/28/21 1144 10/28/21 2021 10/29/21 0621  ?Weight: 49.2 kg 59.3 kg 52.7 kg  ? ? ?Physical Exam ? ?Gen:- Awake Alert, no conversational dyspnea at rest  ?HEENT:- [place].AT, No sclera icterus, Eye--left eye vision loss ?Neck-Supple Neck, ?Lungs-diminished, scattered wheezes  ?CV- S1, S2  normal, regular  ?Abd-  +ve B.Sounds, Abd Soft, No tenderness,    ?Extremity/Skin:-Trace edema, pedal pulses present  ?Psych-affect is appropriate, oriented x3 ?Neuro-no new focal deficits, no tremors ? ?Data Reviewed: I have personally reviewed following labs and imaging studies ? ?CBC: ?Recent Labs  ?Lab 10/28/21 ?1145 10/28/21 ?1514 10/29/21 ?0443  ?WBC 5.5 5.7 3.4*  ?HGB 12.4* 12.9* 15.0  ?HCT 35.0* 36.3* 43.4  ?MCV 93.3 92.1 90.0  ?PLT 122* 114* 166  ? ?Basic Metabolic Panel: ?Recent Labs  ?Lab 10/28/21 ?1145 10/28/21 ?1514 10/29/21 ?0443  ?NA 120*  --  129*  ?K 3.7  --  3.4*  ?CL 88*  --  93*  ?  CO2 23  --  23  ?GLUCOSE 82  --  181*  ?BUN 8  --  12  ?CREATININE 0.49* 0.48* 0.53*  ?CALCIUM 8.0*  --  8.4*  ?MG  --  1.5* 1.8  ?PHOS  --  4.3  --   ? ?GFR: ?Estimated Creatinine Clearance: 73.2 mL/min (A) (by C-G formula based on SCr of 0.53 mg/dL (L)). ?Liver Function Tests: ?Recent Labs  ?Lab 10/28/21 ?1306  ?AST 109*  ?ALT 36  ?ALKPHOS 148*  ?BILITOT 1.3*  ?PROT 6.7  ?ALBUMIN 3.3*  ? ?Cardiac Enzymes: ?No results for input(s): CKTOTAL, CKMB, CKMBINDEX, TROPONINI in the last 168 hours. ?BNP (last 3 results) ?No results for input(s): PROBNP in the last 8760 hours. ?HbA1C: ?No results for input(s): HGBA1C in the last 72 hours. ?Sepsis Labs: ?'@LABRCNTIP'$ (procalcitonin:4,lacticidven:4) ?) ?Recent Results (from the past 240 hour(s))  ?Resp Panel by RT-PCR (Flu A&B, Covid) Nasopharyngeal Swab     Status: None  ? Collection Time: 10/28/21 12:41 PM  ? Specimen: Nasopharyngeal Swab; Nasopharyngeal(NP) swabs in vial transport medium  ?Result Value Ref Range Status  ? SARS Coronavirus 2 by RT PCR NEGATIVE NEGATIVE Final  ?  Comment: (NOTE) ?SARS-CoV-2 target nucleic acids are NOT DETECTED. ? ?The SARS-CoV-2 RNA is generally detectable in upper respiratory ?specimens during the acute phase of infection. The lowest ?concentration of SARS-CoV-2 viral copies this assay can detect is ?138 copies/mL. A negative result does not  preclude SARS-Cov-2 ?infection and should not be used as the sole basis for treatment or ?other patient management decisions. A negative result may occur with  ?improper specimen collection/handling, submission of

## 2021-10-29 NOTE — TOC Initial Note (Signed)
Transition of Care (TOC) - Initial/Assessment Note  ? ? ?Patient Details  ?Name: Francisco Robinson ?MRN: [id number] ?Date of Birth: December 05, 1960 ? ?Transition of Care (TOC) CM/SW Contact:    ?Cambryn Charters D, LCSW ?Phone Number: ?10/29/2021, 1:46 PM ? ?Clinical Narrative:                 ?Patient from home. Admitted three weeks ago. Gets medications from [place]. Per pharmacy he last picked up medications on 3/6 and 3/13. He can switch to delivery; however, he would need to tell them when he calls in his refills that he wants to switch to delivery.  ?Patient discussed that he may switch to delivery. Explained process of switching to delivery. Patient says he gets most of his medications and does not get some for affordability. Patient's scripts are $4/script. He has Medicaid.  ? ?Expected Discharge Plan: Home/Self Care ?Barriers to Discharge: Continued Medical Work up ? ? ?Patient Goals and CMS Choice ?  ?  ?  ? ?Expected Discharge Plan and Services ?Expected Discharge Plan: Home/Self Care ?  ?  ?  ?  ?                ?  ?  ?  ?  ?  ?  ?  ?  ?  ?  ? ?Prior Living Arrangements/Services ?  ?  ?  ?       ?  ?  ?  ?  ? ?Activities of Daily Living ?Home Assistive Devices/Equipment: Eyeglasses ?ADL Screening (condition at time of admission) ?Patient's cognitive ability adequate to safely complete daily activities?: Yes ?Is the patient deaf or have difficulty hearing?: No ?Does the patient have difficulty seeing, even when wearing glasses/contacts?: No ?Does the patient have difficulty concentrating, remembering, or making decisions?: Yes ?Patient able to express need for assistance with ADLs?: Yes ?Does the patient have difficulty dressing or bathing?: No ?Independently performs ADLs?: Yes (appropriate for developmental age) ?Does the patient have difficulty walking or climbing stairs?: No ?Weakness of Legs: Both ?Weakness of Arms/Hands: None ? ?Permission Sought/Granted ?  ?  ?   ?   ?   ?   ? ?Emotional  Assessment ?  ?  ?  ?  ?  ?  ? ?Admission diagnosis:  Hyponatremia [E87.1] ?Acute on chronic systolic congestive heart failure ([place]) [I50.23] ?Acute on chronic systolic (congestive) heart failure ([place]) [I50.23] ?Patient Active Problem List  ? Diagnosis Date Noted  ? Acute on chronic systolic (congestive) heart failure ([place]) 10/28/2021  ? Hyponatremia 10/28/2021  ? Tobacco abuse 10/28/2021  ? Acute exacerbation of CHF (congestive heart failure) ([place]) 10/05/2021  ? COPD exacerbation ([place]) 08/17/2021  ? Noncompliance with medication regimen 08/17/2021  ? Cardiomyopathy ([place]) 07/07/2021  ? Pneumonia 07/05/2021  ? Delirium tremens/[place]co[place] Withdrawal 06/17/2021  ? Acute respiratory failure with hypoxia ([place]) 06/14/2021  ? History of stroke 06/14/2021  ? Seizure ([place]) 06/14/2021  ? History of MI (myocardial infarction) 06/14/2021  ? History of ETOH abuse 06/14/2021  ? Diabetes mellitus without complication ([place]) 97/35/3299  ? Hepatic cirrhosis ([place]) 06/11/2014  ? Constipation 06/11/2014  ? Alcohol abuse 07/15/2011  ? COPD (chronic obstructive pulmonary disease) ([place]) 07/15/2011  ? Chronic pain due to trauma 07/15/2011  ? ?PCP:  Carrolyn Meiers, MD ?Pharmacy:   ?[facility] [address] ?[place] [address] ?Phone: [phone] Fax: [phone] ? ? ? ? ?Social Determinants of Health (SDOH) Interventions ?  ? ?  Readmission Risk Interventions ? ?  08/18/2021  ?  1:43 PM  ?Readmission Risk Prevention Plan  ?Transportation Screening Complete  ?Home Care Screening Complete  ?Medication Review (RN CM) Complete  ? ? ? ?

## 2021-10-30 ENCOUNTER — Inpatient Hospital Stay (HOSPITAL_COMMUNITY): Payer: Medicaid Other

## 2021-10-30 LAB — BASIC METABOLIC PANEL
Anion gap: 10 (ref 5–15)
BUN: 18 mg/dL (ref 6–20)
CO2: 24 mmol/L (ref 22–32)
Calcium: 8.6 mg/dL — ABNORMAL LOW (ref 8.9–10.3)
Chloride: 100 mmol/L (ref 98–111)
Creatinine, Ser: 0.59 mg/dL — ABNORMAL LOW (ref 0.61–1.24)
GFR, Estimated: >60 mL/min (ref >60)
Glucose, Bld: 150 mg/dL — ABNORMAL HIGH (ref 70–99)
Potassium: 4 mmol/L (ref 3.5–5.1)
Sodium: 134 mmol/L — ABNORMAL LOW (ref 135–145)

## 2021-10-30 NOTE — Progress Notes (Signed)
Pt left AMA, pt states he is walking home, nurse attempted to call pt's friend (nate) for a ride, nurse attempted to call pt's contact person (sister) to update about pt's situation, per MD pt is alert and oriented at baseline and can make his own decisions, IV removed, pt transported to main entrance via wheelchair.   ?

## 2021-10-30 NOTE — Discharge Summary (Signed)
?- ?  Please see full progress note dated 10/30/2021 ? ?-- Patient declines repeat chest x-ray and further treatment ?- ?Patient states he wants to go home and smoke drink, states he thinks he will not  use cocaine, but he plans to drink and smoke ?- ?- ?Patient declines repeat chest x-ray ?- ?He was pretty persistent and adamant about leaving today ?- ?Patient left AMA ? ?Please see full progress note dated 10/30/2021 ?

## 2021-10-30 NOTE — Progress Notes (Signed)
?PROGRESS NOTE ? ? ? ? ?Francisco Robinson, is a 61 y.o. male, DOB - 1961/06/13, [id number] ? ?Admit date - 10/28/2021   Admitting Physician Barton Dubois, MD ? ?Outpatient Primary MD for the patient is Fanta, [name], MD ? ?LOS - 2 ? ?Chief Complaint  ?Patient presents with  ? Shortness of Breath  ?    ? ? ?Brief Narrative:  ? ?61 y.o. male with medical history significant for CAD/prior MI, history of alcohol Abuse/DTs,DM2, COPD, ongoing tobacco abuse, and systolic dysfunction CHF, h/o prior CVA and prior  history of alcohol withdrawal related seizure, history of hep C and alcoholic liver cirrhosis admitted on 10/28/21 with CHF exacerbation ?  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--last known EF in the 30 to 35% range prior echo showed grade 1 diastolic dysfunction ?-Elevated BNP and chest x-ray with vascular congestion and interstitial edema ?-Continue IV Lasix 40 mg every 12 ?-Continue Coreg 3.125 mg twice daily and Cozaar 25 mg daily ?-Hold spironolactone due to soft BP- ?-TSH is 3.5 ?-Continue daily weights and fluid input and output monitoring ?-Weight is inaccurate and all over the place-- (wt has ranged from 108 pounds to 130 pounds over the last 48 hours) ?-- Need to get a standing weight ?-Continue midodrine 5 mg 3 times daily for pressure support ?-Repeat chest x-ray in a.m. ? ? ?2)Acute COPD exacerbation/Tobaco Abuse- ?-Patient is not interested in smoking cessation ?-Continue IV Solu-Medrol, Brovana, Pulmicort, steroids, mucolytic's and DuoNeb. ?-Concerns for bronchiectasis, continue doxycycline ?-Repeat chest x-ray in a.m. ? ?3)Acute respiratory failure with hypoxia ([place]) ?- -Hypoxia improving with treatment and management of #1 and # 2 above ?-Weaning off oxygen nicely- ? ?4)Polysubstance abuse--H/o cocaine abuse ?-Patient not interested in rehab ? ?5)Hep C and Alcoholic Liver Cirrhosis- ?-Discontinue aldactone due to soft BP ?-Monitor  LFTs ? ?6)Alcohol abuse ?-Not Interested in alcohol rehab ?-Patient at baseline drinking approximately 12 beers on daily basis ?-Continue lorazepam per CIWA protocol, continue thiamine and folic acid ? ?7)Hyponatremia/Hypomagnesemia/hypokalemia in the setting of alcohol abuse--- replace ?Suspect beer potomania, compounded by fluid overload with heart failure exacerbation and decompensation with underlying cirrhosis. ?-Avoid excessive free water ?-Sodium improving ?- ?8)H/o CVA and CAD--- aspirin and Lipitor for secondary stroke prevention -- ?-Abstinence from cocaine and tobacco advised ?-Continue Coreg, losartan,  aspirin and Lipitor ? ?9)Thrombocytopenia--related to direct toxic effect of alcohol on bone marrow compounded by underlying liver disease/cirrhosis ?-Consider holding Lovenox if platelets continue to drop ? ?Disposition/Need for in-Hospital Stay- patient unable to be discharged at this time due to --acute COPD and CHF-patient requiring IV steroids and IV diuretics* ? ?Status is: Inpatient  ? ?Disposition: The patient is from: Home ?             Anticipated d/c is to: Home ?             Anticipated d/c date is: 1 day ?             Patient currently is not medically stable to d/c. ?Barriers: Not Clinically Stable-  ? ?Code Status :  -  Code Status: Full Code  ? ?Family Communication:    NA (patient is alert, awake and coherent)  ? ?DVT Prophylaxis  :   - SCDs   enoxaparin (LOVENOX) injection 40 mg Start: 10/29/21 2200 ? ? ?Lab Results  ?Component Value Date  ? PLT 166 10/29/2021  ? ? ?Inpatient Medications ? ?Scheduled Meds: ? arformoterol  15 mcg Nebulization  BID  ? aspirin EC  81 mg Oral Q breakfast  ? atorvastatin  40 mg Oral Daily  ? budesonide (PULMICORT) nebulizer solution  0.5 mg Nebulization BID  ? carvedilol  3.125 mg Oral BID WC  ? dextromethorphan-guaiFENesin  1 tablet Oral BID  ? doxycycline  100 mg Oral Q12H  ? enoxaparin (LOVENOX) injection  40 mg Subcutaneous Q30S  ? folic acid  1 mg Oral  Daily  ? furosemide  40 mg Intravenous Q12H  ? ipratropium-albuterol  3 mL Nebulization QID  ? LORazepam  0-4 mg Intravenous Q6H  ? Or  ? LORazepam  0-4 mg Oral Q6H  ? LORazepam  0-4 mg Intravenous Q12H  ? Or  ? LORazepam  0-4 mg Oral Q12H  ? losartan  25 mg Oral Daily  ? methylPREDNISolone (SOLU-MEDROL) injection  40 mg Intravenous Q12H  ? midodrine  5 mg Oral TID WC  ? multivitamin with minerals  1 tablet Oral Daily  ? nicotine  21 mg Transdermal Daily  ? thiamine  100 mg Oral Daily  ? ?Continuous Infusions: ?PRN Meds:.acetaminophen **OR** acetaminophen, LORazepam **OR** LORazepam, ondansetron **OR** ondansetron (ZOFRAN) IV ? ? ?Anti-infectives (From admission, onward)  ? ? Start     Dose/Rate Route Frequency Ordered Stop  ? 10/29/21 0800  doxycycline (VIBRA-TABS) tablet 100 mg       ? 100 mg Oral Every 12 hours 10/28/21 1505    ? 10/28/21 1245  cefTRIAXone (ROCEPHIN) 1 g in sodium chloride 0.9 % 100 mL IVPB       ? 1 g ?200 mL/hr over 30 Minutes Intravenous  Once 10/28/21 1243 10/28/21 1640  ? 10/28/21 1245  azithromycin (ZITHROMAX) 500 mg in sodium chloride 0.9 % 250 mL IVPB  Status:  Discontinued       ? 500 mg ?250 mL/hr over 60 Minutes Intravenous Every 24 hours 10/28/21 1243 10/28/21 1505  ? ?  ? ?  ? ?Subjective: ?Jamarius Saha today has no fevers, no emesis,  No chest pain,   ?10/30/21 ?-Patient had a fall overnight ?-Episodes of confusion and agitation and restlessness ?-Cough and dyspnea on exertion persist ? ? ?Objective: ?Vitals:  ? 10/30/21 0139 10/30/21 0500 10/30/21 9233 10/30/21 0935  ?BP: 105/78  109/84   ?Pulse: (!) 110  (!) 110   ?Resp: 20  18   ?Temp: 98.4 ?F (36.9 ?C)  97.9 ?F (36.6 ?C)   ?TempSrc: Oral     ?SpO2: 92%  97% 96%  ?Weight:  57.9 kg    ?Height:      ? ? ?Intake/Output Summary (Last 24 hours) at 10/30/2021 0937 ?Last data filed at 10/30/2021 0012 ?Gross per 24 hour  ?Intake 700 ml  ?Output 2600 ml  ?Net -1900 ml  ? ?Filed Weights  ? 10/28/21 2021 10/29/21 0621 10/30/21 0500   ?Weight: 59.3 kg 52.7 kg 57.9 kg  ? ? ?Physical Exam ? ?Gen:- Awake Alert, dyspnea on exertion persist ?HEENT:- [place].AT, No sclera icterus, Eye--left eye vision loss ?Neck-Supple Neck, ?Lungs-diminished, scattered wheezes  ?CV- S1, S2 normal, regular  ?Abd-  +ve B.Sounds, Abd Soft, No tenderness,    ?Extremity/Skin:-Trace edema, pedal pulses present  ?Psych-affect is appropriate, oriented x3 ?Neuro-no new focal deficits, no tremors ? ?Data Reviewed: I have personally reviewed following labs and imaging studies ? ?CBC: ?Recent Labs  ?Lab 10/28/21 ?1145 10/28/21 ?1514 10/29/21 ?0443  ?WBC 5.5 5.7 3.4*  ?HGB 12.4* 12.9* 15.0  ?HCT 35.0* 36.3* 43.4  ?MCV 93.3 92.1 90.0  ?PLT  122* 114* 166  ? ?Basic Metabolic Panel: ?Recent Labs  ?Lab 10/28/21 ?1145 10/28/21 ?1514 10/29/21 ?0443 10/30/21 ?0442  ?NA 120*  --  129* 134*  ?K 3.7  --  3.4* 4.0  ?CL 88*  --  93* 100  ?CO2 23  --  23 24  ?GLUCOSE 82  --  181* 150*  ?BUN 8  --  12 18  ?CREATININE 0.49* 0.48* 0.53* 0.59*  ?CALCIUM 8.0*  --  8.4* 8.6*  ?MG  --  1.5* 1.8  --   ?PHOS  --  4.3  --   --   ? ?GFR: ?Estimated Creatinine Clearance: 80.4 mL/min (A) (by C-G formula based on SCr of 0.59 mg/dL (L)). ?Liver Function Tests: ?Recent Labs  ?Lab 10/28/21 ?1306  ?AST 109*  ?ALT 36  ?ALKPHOS 148*  ?BILITOT 1.3*  ?PROT 6.7  ?ALBUMIN 3.3*  ? ?Cardiac Enzymes: ?No results for input(s): CKTOTAL, CKMB, CKMBINDEX, TROPONINI in the last 168 hours. ?BNP (last 3 results) ?No results for input(s): PROBNP in the last 8760 hours. ?HbA1C: ?No results for input(s): HGBA1C in the last 72 hours. ?Sepsis Labs: ?'@LABRCNTIP'$ (procalcitonin:4,lacticidven:4) ?) ?Recent Results (from the past 240 hour(s))  ?Resp Panel by RT-PCR (Flu A&B, Covid) Nasopharyngeal Swab     Status: None  ? Collection Time: 10/28/21 12:41 PM  ? Specimen: Nasopharyngeal Swab; Nasopharyngeal(NP) swabs in vial transport medium  ?Result Value Ref Range Status  ? SARS Coronavirus 2 by RT PCR NEGATIVE NEGATIVE Final  ?  Comment:  (NOTE) ?SARS-CoV-2 target nucleic acids are NOT DETECTED. ? ?The SARS-CoV-2 RNA is generally detectable in upper respiratory ?specimens during the acute phase of infection. The lowest ?concentration of SARS-CoV-2 vir

## 2021-10-30 NOTE — TOC Progression Note (Signed)
Transition of Care (TOC) - Progression Note  ? ? ?Patient Details  ?Name: Tayshaun Kroh ?MRN: [id number] ?Date of Birth: 1961/05/24 ? ?Transition of Care (TOC) CM/SW Contact  ?Aristeo Hankerson D, LCSW ?Phone Number: ?10/30/2021, 12:22 PM ? ?Clinical Narrative:    ?Patient declines SA resources.  ? ? ?Expected Discharge Plan: Home/Self Care ?Barriers to Discharge: Continued Medical Work up ? ?Expected Discharge Plan and Services ?Expected Discharge Plan: Home/Self Care ?  ?  ?  ?  ?                ?  ?  ?  ?  ?  ?  ?  ?  ?  ?  ? ? ?Social Determinants of Health (SDOH) Interventions ?  ? ?Readmission Risk Interventions ? ?  10/29/2021  ?  2:10 PM 08/18/2021  ?  1:43 PM  ?Readmission Risk Prevention Plan  ?Transportation Screening Complete Complete  ?Home Care Screening  Complete  ?Medication Review (RN CM)  Complete  ?Medication Review Press photographer) Complete   ?Palliative Care Screening Not Applicable   ?[place] Not Applicable   ? ? ?

## 2021-10-30 NOTE — Progress Notes (Signed)
Entered room in response to bed alarm and patient was lying on the floor. He was responsive and oriented X2 but was not cooperative when questioned. Was assisted by nurse tech in helping patient off of the floor and he became agitated. He demanded to walk to the bathroom and we assisted. Denied hitting head. No skin injuries occurred. Hospitalist notified. Will continue to monitor.  ?

## 2021-11-03 LAB — CULTURE, BLOOD (ROUTINE X 2)
Culture: NO GROWTH
Culture: NO GROWTH
Special Requests: ADEQUATE
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
## 2021-11-05 ENCOUNTER — Ambulatory Visit: Payer: Medicaid Other | Admitting: Cardiovascular Disease

## 2021-11-05 DIAGNOSIS — I350 Nonrheumatic aortic (valve) stenosis: Secondary | ICD-10-CM

## 2021-11-05 DIAGNOSIS — I5022 Chronic systolic (congestive) heart failure: Secondary | ICD-10-CM

## 2021-11-24 ENCOUNTER — Inpatient Hospital Stay (HOSPITAL_COMMUNITY)
Admission: EM | Admit: 2021-11-24 | Discharge: 2021-11-26 | DRG: 292 | Discharge status: Against Medical Advice | Payer: Medicaid Other | Attending: Internal Medicine | Admitting: Internal Medicine

## 2021-11-24 ENCOUNTER — Emergency Department (HOSPITAL_COMMUNITY): Payer: Medicaid Other

## 2021-11-24 ENCOUNTER — Encounter (HOSPITAL_COMMUNITY): Payer: Self-pay | Admitting: Emergency Medicine

## 2021-11-24 ENCOUNTER — Other Ambulatory Visit: Payer: Self-pay

## 2021-11-24 DIAGNOSIS — K7460 Unspecified cirrhosis of liver: Secondary | ICD-10-CM | POA: Diagnosis present

## 2021-11-24 DIAGNOSIS — E785 Hyperlipidemia, unspecified: Secondary | ICD-10-CM | POA: Diagnosis present

## 2021-11-24 DIAGNOSIS — Z8673 Personal history of transient ischemic attack (TIA), and cerebral infarction without residual deficits: Secondary | ICD-10-CM

## 2021-11-24 DIAGNOSIS — J441 Chronic obstructive pulmonary disease with (acute) exacerbation: Secondary | ICD-10-CM | POA: Diagnosis not present

## 2021-11-24 DIAGNOSIS — F1010 Alcohol abuse, uncomplicated: Secondary | ICD-10-CM | POA: Diagnosis not present

## 2021-11-24 DIAGNOSIS — Z7982 Long term (current) use of aspirin: Secondary | ICD-10-CM

## 2021-11-24 DIAGNOSIS — E119 Type 2 diabetes mellitus without complications: Secondary | ICD-10-CM | POA: Diagnosis present

## 2021-11-24 DIAGNOSIS — Z79899 Other long term (current) drug therapy: Secondary | ICD-10-CM

## 2021-11-24 DIAGNOSIS — E871 Hypo-osmolality and hyponatremia: Secondary | ICD-10-CM | POA: Diagnosis present

## 2021-11-24 DIAGNOSIS — Z7951 Long term (current) use of inhaled steroids: Secondary | ICD-10-CM

## 2021-11-24 DIAGNOSIS — I252 Old myocardial infarction: Secondary | ICD-10-CM

## 2021-11-24 DIAGNOSIS — M1990 Unspecified osteoarthritis, unspecified site: Secondary | ICD-10-CM | POA: Diagnosis present

## 2021-11-24 DIAGNOSIS — Z5329 Procedure and treatment not carried out because of patient's decision for other reasons: Secondary | ICD-10-CM | POA: Diagnosis not present

## 2021-11-24 DIAGNOSIS — Z86010 Personal history of colonic polyps: Secondary | ICD-10-CM

## 2021-11-24 DIAGNOSIS — I5023 Acute on chronic systolic (congestive) heart failure: Secondary | ICD-10-CM | POA: Diagnosis not present

## 2021-11-24 DIAGNOSIS — F17210 Nicotine dependence, cigarettes, uncomplicated: Secondary | ICD-10-CM | POA: Diagnosis present

## 2021-11-24 DIAGNOSIS — I509 Heart failure, unspecified: Secondary | ICD-10-CM

## 2021-11-24 DIAGNOSIS — Z8619 Personal history of other infectious and parasitic diseases: Secondary | ICD-10-CM

## 2021-11-24 DIAGNOSIS — Z720 Tobacco use: Secondary | ICD-10-CM | POA: Diagnosis present

## 2021-11-24 DIAGNOSIS — Z8719 Personal history of other diseases of the digestive system: Secondary | ICD-10-CM

## 2021-11-24 DIAGNOSIS — E782 Mixed hyperlipidemia: Secondary | ICD-10-CM | POA: Diagnosis present

## 2021-11-24 LAB — CBC WITH DIFFERENTIAL/PLATELET
Abs Immature Granulocytes: 0.03 10*3/uL (ref 0.00–0.07)
Basophils Absolute: 0 10*3/uL (ref 0.0–0.1)
Basophils Relative: 1 %
Eosinophils Absolute: 0.1 10*3/uL (ref 0.0–0.5)
Eosinophils Relative: 2 %
HCT: 34.2 % — ABNORMAL LOW (ref 39.0–52.0)
Hemoglobin: 12 g/dL — ABNORMAL LOW (ref 13.0–17.0)
Immature Granulocytes: 1 %
Lymphocytes Relative: 20 %
Lymphs Abs: 1.3 10*3/uL (ref 0.7–4.0)
MCH: 32.7 pg (ref 26.0–34.0)
MCHC: 35.1 g/dL (ref 30.0–36.0)
MCV: 93.2 fL (ref 80.0–100.0)
Monocytes Absolute: 0.6 10*3/uL (ref 0.1–1.0)
Monocytes Relative: 9 %
Neutro Abs: 4.5 10*3/uL (ref 1.7–7.7)
Neutrophils Relative %: 67 %
Platelets: 156 10*3/uL (ref 150–400)
RBC: 3.67 MIL/uL — ABNORMAL LOW (ref 4.22–5.81)
RDW: 17 % — ABNORMAL HIGH (ref 11.5–15.5)
WBC: 6.6 10*3/uL (ref 4.0–10.5)
nRBC: 0 % (ref 0.0–0.2)

## 2021-11-24 LAB — COMPREHENSIVE METABOLIC PANEL
ALT: 39 U/L (ref 0–44)
AST: 93 U/L — ABNORMAL HIGH (ref 15–41)
Albumin: 3 g/dL — ABNORMAL LOW (ref 3.5–5.0)
Alkaline Phosphatase: 160 U/L — ABNORMAL HIGH (ref 38–126)
Anion gap: 9 (ref 5–15)
BUN: 7 mg/dL (ref 6–20)
CO2: 19 mmol/L — ABNORMAL LOW (ref 22–32)
Calcium: 8.3 mg/dL — ABNORMAL LOW (ref 8.9–10.3)
Chloride: 95 mmol/L — ABNORMAL LOW (ref 98–111)
Creatinine, Ser: 0.44 mg/dL — ABNORMAL LOW (ref 0.61–1.24)
GFR, Estimated: >60 mL/min (ref >60)
Glucose, Bld: 119 mg/dL — ABNORMAL HIGH (ref 70–99)
Potassium: 3.6 mmol/L (ref 3.5–5.1)
Sodium: 123 mmol/L — ABNORMAL LOW (ref 135–145)
Total Bilirubin: 1.2 mg/dL (ref 0.3–1.2)
Total Protein: 6.5 g/dL (ref 6.5–8.1)

## 2021-11-24 LAB — MAGNESIUM: Magnesium: 1.6 mg/dL — ABNORMAL LOW (ref 1.7–2.4)

## 2021-11-24 LAB — BRAIN NATRIURETIC PEPTIDE: B Natriuretic Peptide: 3046 pg/mL — ABNORMAL HIGH (ref 0.0–100.0)

## 2021-11-24 LAB — TROPONIN I (HIGH SENSITIVITY)
Troponin I (High Sensitivity): 16 ng/L (ref <18)
Troponin I (High Sensitivity): 16 ng/L (ref <18)

## 2021-11-24 LAB — PHOSPHORUS: Phosphorus: 3.7 mg/dL (ref 2.5–4.6)

## 2021-11-24 MED ORDER — ACETAMINOPHEN 650 MG RE SUPP: 650.0000 mg | Freq: Four times a day (QID) | RECTAL | Status: DC | PRN

## 2021-11-24 MED ORDER — DM-GUAIFENESIN ER 30-600 MG PO TB12
1.0000 | ORAL_TABLET | Freq: Two times a day (BID) | ORAL | Status: DC
  Administered 2021-11-24 – 2021-11-25 (×3): 1 via ORAL
  Filled 2021-11-24 (×5): qty 1

## 2021-11-24 MED ORDER — NICOTINE 21 MG/24HR TD PT24
21.0000 mg | MEDICATED_PATCH | Freq: Every day | TRANSDERMAL | Status: DC
  Administered 2021-11-24 – 2021-11-25 (×2): 21 mg via TRANSDERMAL
  Filled 2021-11-24 (×3): qty 1

## 2021-11-24 MED ORDER — HYDRALAZINE HCL 25 MG PO TABS
25.0000 mg | ORAL_TABLET | Freq: Three times a day (TID) | ORAL | Status: DC
  Administered 2021-11-24 – 2021-11-25 (×5): 25 mg via ORAL
  Filled 2021-11-24 (×6): qty 1

## 2021-11-24 MED ORDER — ONDANSETRON HCL 4 MG PO TABS: 4.0000 mg | ORAL_TABLET | Freq: Four times a day (QID) | ORAL | Status: DC | PRN

## 2021-11-24 MED ORDER — IPRATROPIUM-ALBUTEROL 0.5-2.5 (3) MG/3ML IN SOLN
3.0000 mL | Freq: Four times a day (QID) | RESPIRATORY_TRACT | Status: DC
  Administered 2021-11-24 – 2021-11-25 (×4): 3 mL via RESPIRATORY_TRACT
  Filled 2021-11-24 (×5): qty 3

## 2021-11-24 MED ORDER — PANTOPRAZOLE SODIUM 40 MG PO TBEC
40.0000 mg | DELAYED_RELEASE_TABLET | Freq: Two times a day (BID) | ORAL | Status: DC
  Administered 2021-11-24 – 2021-11-25 (×4): 40 mg via ORAL
  Filled 2021-11-24 (×4): qty 1

## 2021-11-24 MED ORDER — IPRATROPIUM-ALBUTEROL 0.5-2.5 (3) MG/3ML IN SOLN
3.0000 mL | RESPIRATORY_TRACT | Status: AC
  Administered 2021-11-24 (×3): 3 mL via RESPIRATORY_TRACT
  Filled 2021-11-24 (×3): qty 3

## 2021-11-24 MED ORDER — FUROSEMIDE 10 MG/ML IJ SOLN
40.0000 mg | Freq: Two times a day (BID) | INTRAMUSCULAR | Status: DC
  Administered 2021-11-24 – 2021-11-25 (×4): 40 mg via INTRAVENOUS
  Filled 2021-11-24 (×4): qty 4

## 2021-11-24 MED ORDER — METHYLPREDNISOLONE SODIUM SUCC 40 MG IJ SOLR
40.0000 mg | Freq: Two times a day (BID) | INTRAMUSCULAR | Status: DC
  Administered 2021-11-24 – 2021-11-25 (×4): 40 mg via INTRAVENOUS
  Filled 2021-11-24 (×4): qty 1

## 2021-11-24 MED ORDER — ASPIRIN EC 81 MG PO TBEC
81.0000 mg | DELAYED_RELEASE_TABLET | Freq: Every day | ORAL | Status: DC
  Administered 2021-11-24 – 2021-11-25 (×2): 81 mg via ORAL
  Filled 2021-11-24 (×2): qty 1

## 2021-11-24 MED ORDER — ATORVASTATIN CALCIUM 40 MG PO TABS
40.0000 mg | ORAL_TABLET | Freq: Every day | ORAL | Status: DC
  Administered 2021-11-24 – 2021-11-25 (×2): 40 mg via ORAL
  Filled 2021-11-24 (×2): qty 1

## 2021-11-24 MED ORDER — BUDESONIDE 0.5 MG/2ML IN SUSP
RESPIRATORY_TRACT | Status: AC
Start: 2021-11-24 — End: 2021-11-24
  Administered 2021-11-24: 0.5 mg via RESPIRATORY_TRACT
  Filled 2021-11-24: qty 2

## 2021-11-24 MED ORDER — SUCRALFATE 1 GM/10ML PO SUSP
1.0000 g | Freq: Three times a day (TID) | ORAL | Status: DC
  Administered 2021-11-24 – 2021-11-25 (×6): 1 g via ORAL
  Filled 2021-11-24 (×6): qty 10

## 2021-11-24 MED ORDER — ISOSORBIDE MONONITRATE ER 30 MG PO TB24
15.0000 mg | ORAL_TABLET | Freq: Every day | ORAL | Status: DC
  Administered 2021-11-24 – 2021-11-25 (×2): 15 mg via ORAL
  Filled 2021-11-24 (×2): qty 1

## 2021-11-24 MED ORDER — LORAZEPAM 1 MG PO TABS: 1.0000 mg | ORAL_TABLET | ORAL | Status: DC | PRN

## 2021-11-24 MED ORDER — FOLIC ACID 1 MG PO TABS
1.0000 mg | ORAL_TABLET | Freq: Every day | ORAL | Status: DC
  Administered 2021-11-24 – 2021-11-25 (×2): 1 mg via ORAL
  Filled 2021-11-24 (×2): qty 1

## 2021-11-24 MED ORDER — SODIUM CHLORIDE 0.9% FLUSH
3.0000 mL | Freq: Two times a day (BID) | INTRAVENOUS | Status: DC
  Administered 2021-11-24 – 2021-11-25 (×4): 3 mL via INTRAVENOUS

## 2021-11-24 MED ORDER — CARVEDILOL 3.125 MG PO TABS
3.1250 mg | ORAL_TABLET | Freq: Two times a day (BID) | ORAL | Status: DC
  Administered 2021-11-24 – 2021-11-25 (×4): 3.125 mg via ORAL
  Filled 2021-11-24 (×4): qty 1

## 2021-11-24 MED ORDER — SODIUM CHLORIDE 0.9% FLUSH: 3.0000 mL | INTRAVENOUS | Status: DC | PRN

## 2021-11-24 MED ORDER — ADULT MULTIVITAMIN W/MINERALS CH
1.0000 | ORAL_TABLET | Freq: Every day | ORAL | Status: DC
  Administered 2021-11-24 – 2021-11-25 (×2): 1 via ORAL
  Filled 2021-11-24 (×3): qty 1

## 2021-11-24 MED ORDER — ONDANSETRON HCL 4 MG/2ML IJ SOLN
4.0000 mg | Freq: Four times a day (QID) | INTRAMUSCULAR | Status: DC | PRN
  Administered 2021-11-24: 4 mg via INTRAVENOUS
  Filled 2021-11-24: qty 2

## 2021-11-24 MED ORDER — ACETAMINOPHEN 325 MG PO TABS
650.0000 mg | ORAL_TABLET | Freq: Four times a day (QID) | ORAL | Status: DC | PRN
  Administered 2021-11-24: 650 mg via ORAL
  Filled 2021-11-24: qty 2

## 2021-11-24 MED ORDER — THIAMINE HCL 100 MG PO TABS
100.0000 mg | ORAL_TABLET | Freq: Every day | ORAL | Status: DC
  Administered 2021-11-24 – 2021-11-25 (×2): 100 mg via ORAL
  Filled 2021-11-24 (×2): qty 1

## 2021-11-24 MED ORDER — SODIUM CHLORIDE 0.9 % IV SOLN: 250.0000 mL | INTRAVENOUS | Status: DC | PRN

## 2021-11-24 MED ORDER — ADULT MULTIVITAMIN W/MINERALS CH: 1.0000 | ORAL_TABLET | Freq: Every day | ORAL | Status: DC

## 2021-11-24 MED ORDER — MAGIC MOUTHWASH W/LIDOCAINE
10.0000 mL | Freq: Four times a day (QID) | ORAL | Status: DC | PRN
  Administered 2021-11-25: 10 mL via ORAL
  Filled 2021-11-24: qty 10

## 2021-11-24 MED ORDER — FUROSEMIDE 10 MG/ML IJ SOLN
40.0000 mg | Freq: Once | INTRAMUSCULAR | Status: AC
  Administered 2021-11-24: 40 mg via INTRAVENOUS
  Filled 2021-11-24: qty 4

## 2021-11-24 MED ORDER — LORAZEPAM 2 MG/ML IJ SOLN
1.0000 mg | INTRAMUSCULAR | Status: DC | PRN
  Administered 2021-11-24 – 2021-11-25 (×6): 2 mg via INTRAVENOUS
  Administered 2021-11-25 – 2021-11-26 (×2): 4 mg via INTRAVENOUS
  Filled 2021-11-24 (×2): qty 1
  Filled 2021-11-24: qty 2
  Filled 2021-11-24 (×2): qty 1
  Filled 2021-11-24 (×2): qty 2
  Filled 2021-11-24 (×2): qty 1

## 2021-11-24 MED ORDER — HEPARIN SODIUM (PORCINE) 5000 UNIT/ML IJ SOLN
5000.0000 [IU] | Freq: Three times a day (TID) | INTRAMUSCULAR | Status: DC
  Administered 2021-11-24 – 2021-11-25 (×5): 5000 [IU] via SUBCUTANEOUS
  Filled 2021-11-24 (×5): qty 1

## 2021-11-24 MED ORDER — BUDESONIDE 0.5 MG/2ML IN SUSP
0.5000 mg | Freq: Two times a day (BID) | RESPIRATORY_TRACT | Status: DC
  Administered 2021-11-25 (×2): 0.5 mg via RESPIRATORY_TRACT
  Filled 2021-11-24 (×3): qty 2

## 2021-11-24 MED ORDER — IPRATROPIUM-ALBUTEROL 0.5-2.5 (3) MG/3ML IN SOLN
RESPIRATORY_TRACT | Status: AC
  Administered 2021-11-24: 3 mL via RESPIRATORY_TRACT
  Filled 2021-11-24: qty 3

## 2021-11-24 NOTE — Assessment & Plan Note (Addendum)
-  In the setting of chronic alcohol use ?-Appears to be chronic and currently at baseline ?-Continue to follow electrolytes trend. ?-sodium 128 currently ?

## 2021-11-24 NOTE — ED Notes (Signed)
Known patient brought in for shortness of breath and nasal congestion. Patient is intoxicated has some upper air way wheezes. Complains of nose not allowing any air to pass. Can't breath through mouth do to sores on gums and throat. Patient has numerous white spots on back of throat. Rinsed nasal passages with saline. Gave 3 nebs with duoneb solution back to back.  ?

## 2021-11-24 NOTE — Progress Notes (Signed)
Nurse notified patient's sister, Hilda Blades, that patient has been admitted into the hospital. Hilda Blades would like to continue to be updated on patient's condition.  ?

## 2021-11-24 NOTE — Assessment & Plan Note (Addendum)
-  Expiratory wheezing and decreased air movement appreciated during admission. ?-Patient is slowly improving ?-Still complaining of short winded sensation with activity and is still having difficulty speaking in full sentences. ?-Will continue treatment with steroids, bronchodilators management and Pulmicort. ?-Flutter valve and PPI twice a day. ?

## 2021-11-24 NOTE — Assessment & Plan Note (Addendum)
-  Continue statins ?

## 2021-11-24 NOTE — ED Provider Notes (Signed)
?[facility] ?Provider Note ? ? ?CSN: [id number] ?Arrival date & time: 11/24/21  0449 ? ?  ? ?History ? ?Chief Complaint  ?Patient presents with  ? Shortness of Breath  ? ? ?Francisco Robinson is a 61 y.o. male. ? ?61 year old male with a history of COPD and CHF that often has CHF exacerbations.  Also has a history of alcoholism.  Patient states he was having stuffy nose and congestion that progressively worsened.  Called EMS secondary to shortness of breath and chest pain.  Presents here with the same.  Some cough but no fever.  No productive cough.  Does have lower extremity swelling.  Patient is unsure if this is more similar to fluid or COPD exacerbation.  States compliance with medications. ? ? ?Shortness of Breath ? ?  ? ?Home Medications ?Prior to Admission medications   ?Medication Sig Start Date End Date Taking? Authorizing Provider  ?albuterol (VENTOLIN HFA) 108 (90 Base) MCG/ACT inhaler Inhale 2 puffs into the lungs every 6 (six) hours as needed for wheezing or shortness of breath. 10/07/21   Roxan Hockey, MD  ?aspirin EC 81 MG EC tablet Take 1 tablet (81 mg total) by mouth daily with breakfast. Swallow whole. ?Patient not taking: Reported on 10/28/2021 10/08/21   Roxan Hockey, MD  ?atorvastatin (LIPITOR) 40 MG tablet Take 1 tablet (40 mg total) by mouth daily. 10/07/21 11/06/21  Roxan Hockey, MD  ?carvedilol (COREG) 3.125 MG tablet Take 1 tablet (3.125 mg total) by mouth 2 (two) times daily with a meal. 10/07/21   Emokpae, Courage, MD  ?dextromethorphan-guaiFENesin (MUCINEX DM) 30-600 MG 12hr tablet Take 1 tablet by mouth 2 (two) times daily. 10/07/21   Roxan Hockey, MD  ?folic acid (FOLVITE) 1 MG tablet Take 1 tablet (1 mg total) by mouth daily. 10/07/21   Roxan Hockey, MD  ?furosemide (LASIX) 20 MG tablet Take 1 tablet (20 mg total) by mouth daily. 10/07/21   Roxan Hockey, MD  ?hydrALAZINE (APRESOLINE) 25 MG tablet Take 1 tablet (25 mg total) by mouth every 8 (eight)  hours. 10/07/21   Roxan Hockey, MD  ?hydrocortisone (ANUSOL-HC) 25 MG suppository Place 1 suppository (25 mg total) rectally 2 (two) times daily. 10/07/21   Roxan Hockey, MD  ?isosorbide mononitrate (IMDUR) 30 MG 24 hr tablet Take 0.5 tablets (15 mg total) by mouth daily. 10/08/21   Roxan Hockey, MD  ?losartan (COZAAR) 25 MG tablet Take 1 tablet (25 mg total) by mouth daily. 10/08/21   Roxan Hockey, MD  ?Multiple Vitamin (MULTIVITAMIN WITH MINERALS) TABS tablet Take 1 tablet by mouth daily. 10/08/21   Roxan Hockey, MD  ?nicotine (NICODERM CQ - DOSED IN MG/24 HOURS) 21 mg/24hr patch Place 1 patch (21 mg total) onto the skin daily. 10/07/21   Roxan Hockey, MD  ?Dellis Anes 160-4.5 MCG/ACT inhaler Inhale 1 puff into the lungs 2 (two) times daily. 10/07/21   Roxan Hockey, MD  ?thiamine 100 MG tablet Take 1 tablet (100 mg total) by mouth daily. 10/07/21   Roxan Hockey, MD  ?   ? ?Allergies    ?Patient has no known allergies.   ? ?Review of Systems   ?Review of Systems  ?Respiratory:  Positive for shortness of breath.   ? ?Physical Exam ?Updated Vital Signs ?BP (!) 126/102   Pulse (!) 105   Temp 97.8 ?F (36.6 ?C) (Axillary)   Resp (!) 25   Ht '5\' 7"'$  (1.702 m)   Wt 54.9 kg   SpO2 98%   BMI 18.96  kg/m?  ?Physical Exam ?Vitals and nursing note reviewed.  ?Constitutional:   ?   Appearance: He is well-developed.  ?HENT:  ?   Head: Normocephalic and atraumatic.  ?Cardiovascular:  ?   Rate and Rhythm: Normal rate.  ?Pulmonary:  ?   Effort: Pulmonary effort is normal. No respiratory distress.  ?   Breath sounds: Decreased breath sounds, wheezing and rales present.  ?Abdominal:  ?   General: There is no distension.  ?Musculoskeletal:     ?   General: Normal range of motion.  ?   Cervical back: Normal range of motion.  ?Skin: ?   General: Skin is warm and dry.  ?Neurological:  ?   General: No focal deficit present.  ?   Mental Status: He is alert.  ? ? ?ED Results / Procedures / Treatments   ?Labs ?(all  labs ordered are listed, but only abnormal results are displayed) ?Labs Reviewed  ?CBC WITH DIFFERENTIAL/PLATELET - Abnormal; Notable for the following components:  ?    Result Value  ? RBC 3.67 (*)   ? Hemoglobin 12.0 (*)   ? HCT 34.2 (*)   ? RDW 17.0 (*)   ? All other components within normal limits  ?COMPREHENSIVE METABOLIC PANEL - Abnormal; Notable for the following components:  ? Sodium 123 (*)   ? Chloride 95 (*)   ? CO2 19 (*)   ? Glucose, Bld 119 (*)   ? Creatinine, Ser 0.44 (*)   ? Calcium 8.3 (*)   ? Albumin 3.0 (*)   ? AST 93 (*)   ? Alkaline Phosphatase 160 (*)   ? All other components within normal limits  ?BRAIN NATRIURETIC PEPTIDE - Abnormal; Notable for the following components:  ? B Natriuretic Peptide 3,046.0 (*)   ? All other components within normal limits  ?TROPONIN I (HIGH SENSITIVITY)  ?TROPONIN I (HIGH SENSITIVITY)  ? ? ?EKG ?None ? ?Radiology ?DG Chest Portable 1 View ? ?Result Date: 11/24/2021 ?CLINICAL DATA:  Wheezing and dyspnea. Shortness of breath with nasal congestion. EXAM: PORTABLE CHEST 1 VIEW COMPARISON:  10/28/2021 FINDINGS: Stable cardiac enlargement. Aortic atherosclerosis. Moderate right pleural effusion with overlying subsegmental atelectasis noted. Mild diffuse interstitial edema. No airspace opacities. IMPRESSION: 1. Right pleural effusion and mild interstitial edema compatible with CHF. Electronically Signed   By: Kerby Moors M.D.   On: 11/24/2021 05:51   ? ?Procedures ?Procedures  ? ? ?Medications Ordered in ED ?Medications  ?furosemide (LASIX) injection 40 mg (has no administration in time range)  ?ipratropium-albuterol (DUONEB) 0.5-2.5 (3) MG/3ML nebulizer solution 3 mL (3 mLs Nebulization Given 11/24/21 0509)  ? ? ?ED Course/ Medical Decision Making/ A&P ?  ?                        ?Medical Decision Making ?Amount and/or Complexity of Data Reviewed ?Labs: ordered. ?Radiology: ordered. ? ?Risk ?Prescription drug management. ?Decision regarding  hospitalization. ? ? ?Patient pretty tachypneic with a pleural effusion on x-ray elevated BNP consistent with likely CHF exacerbation but probably also some COPD as well.  Given breathing treatments and Lasix.  Will discuss with hospitalist for admission. ? ? ?Final Clinical Impression(s) / ED Diagnoses ?Final diagnoses:  ?Acute on chronic congestive heart failure, unspecified heart failure type ([place])  ? ? ?Rx / DC Orders ?ED Discharge Orders   ? ? None  ? ?  ? ? ?  ?Merrily Pew, MD ?11/24/21 [phone] ? ?

## 2021-11-24 NOTE — Assessment & Plan Note (Addendum)
-  Patient with elevated BNP, interstitial edema and vascular congestion appreciated ?-Low-sodium diet, daily weights and strict I's and O's will be followed. ?-Patient still with SOB, orthopnea, LE swelling and positive crackles. ?-will continue IV lasix. ?-Last ejection fraction 30% on 2D echo in January 2023. ?-Echo will not be repeated this admission. ?-Medication compliance discussed with patient. ?
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
## 2021-11-24 NOTE — ED Triage Notes (Signed)
Pt c/o shortness of breath and nasal congestion for the past 5 days. Pt given albuterol treatment by EMS.  ?

## 2021-11-24 NOTE — Assessment & Plan Note (Addendum)
-  No complaints of acute neurologic deficits ?-Continue risk factor modifications. ? ?

## 2021-11-24 NOTE — Assessment & Plan Note (Addendum)
-  Cessation counseling provided ?-continue CIWA protocol. ?-Continue thiamine and folic acid. ?

## 2021-11-24 NOTE — Assessment & Plan Note (Addendum)
-  Cessation counseling provided ?-continue Nicotine patch. ?

## 2021-11-25 DIAGNOSIS — J441 Chronic obstructive pulmonary disease with (acute) exacerbation: Secondary | ICD-10-CM | POA: Diagnosis present

## 2021-11-25 DIAGNOSIS — Z7951 Long term (current) use of inhaled steroids: Secondary | ICD-10-CM | POA: Diagnosis not present

## 2021-11-25 DIAGNOSIS — M1990 Unspecified osteoarthritis, unspecified site: Secondary | ICD-10-CM | POA: Diagnosis present

## 2021-11-25 DIAGNOSIS — E871 Hypo-osmolality and hyponatremia: Secondary | ICD-10-CM | POA: Diagnosis present

## 2021-11-25 DIAGNOSIS — F17210 Nicotine dependence, cigarettes, uncomplicated: Secondary | ICD-10-CM | POA: Diagnosis present

## 2021-11-25 DIAGNOSIS — E785 Hyperlipidemia, unspecified: Secondary | ICD-10-CM | POA: Diagnosis present

## 2021-11-25 DIAGNOSIS — Z8673 Personal history of transient ischemic attack (TIA), and cerebral infarction without residual deficits: Secondary | ICD-10-CM | POA: Diagnosis not present

## 2021-11-25 DIAGNOSIS — E119 Type 2 diabetes mellitus without complications: Secondary | ICD-10-CM | POA: Diagnosis present

## 2021-11-25 DIAGNOSIS — Z7982 Long term (current) use of aspirin: Secondary | ICD-10-CM | POA: Diagnosis not present

## 2021-11-25 DIAGNOSIS — I252 Old myocardial infarction: Secondary | ICD-10-CM | POA: Diagnosis not present

## 2021-11-25 DIAGNOSIS — F1010 Alcohol abuse, uncomplicated: Secondary | ICD-10-CM | POA: Diagnosis present

## 2021-11-25 DIAGNOSIS — Z5329 Procedure and treatment not carried out because of patient's decision for other reasons: Secondary | ICD-10-CM | POA: Diagnosis not present

## 2021-11-25 DIAGNOSIS — Z8619 Personal history of other infectious and parasitic diseases: Secondary | ICD-10-CM | POA: Diagnosis not present

## 2021-11-25 DIAGNOSIS — K7460 Unspecified cirrhosis of liver: Secondary | ICD-10-CM | POA: Diagnosis present

## 2021-11-25 DIAGNOSIS — I509 Heart failure, unspecified: Secondary | ICD-10-CM | POA: Diagnosis present

## 2021-11-25 DIAGNOSIS — Z86010 Personal history of colonic polyps: Secondary | ICD-10-CM | POA: Diagnosis not present

## 2021-11-25 DIAGNOSIS — Z8719 Personal history of other diseases of the digestive system: Secondary | ICD-10-CM | POA: Diagnosis not present

## 2021-11-25 DIAGNOSIS — Z79899 Other long term (current) drug therapy: Secondary | ICD-10-CM | POA: Diagnosis not present

## 2021-11-25 DIAGNOSIS — I5023 Acute on chronic systolic (congestive) heart failure: Secondary | ICD-10-CM | POA: Diagnosis present

## 2021-11-25 LAB — BASIC METABOLIC PANEL
Anion gap: 9 (ref 5–15)
BUN: 12 mg/dL (ref 6–20)
CO2: 24 mmol/L (ref 22–32)
Calcium: 8.4 mg/dL — ABNORMAL LOW (ref 8.9–10.3)
Chloride: 95 mmol/L — ABNORMAL LOW (ref 98–111)
Creatinine, Ser: 0.54 mg/dL — ABNORMAL LOW (ref 0.61–1.24)
GFR, Estimated: >60 mL/min (ref >60)
Glucose, Bld: 140 mg/dL — ABNORMAL HIGH (ref 70–99)
Potassium: 3.7 mmol/L (ref 3.5–5.1)
Sodium: 128 mmol/L — ABNORMAL LOW (ref 135–145)

## 2021-11-25 MED ORDER — ALBUTEROL SULFATE HFA 108 (90 BASE) MCG/ACT IN AERS
2.0000 | INHALATION_SPRAY | Freq: Four times a day (QID) | RESPIRATORY_TRACT | Status: DC | PRN
  Administered 2021-11-25: 2 via RESPIRATORY_TRACT
  Filled 2021-11-25: qty 6.7

## 2021-11-25 MED ORDER — IPRATROPIUM-ALBUTEROL 0.5-2.5 (3) MG/3ML IN SOLN
3.0000 mL | Freq: Three times a day (TID) | RESPIRATORY_TRACT | Status: DC
  Administered 2021-11-25 (×2): 3 mL via RESPIRATORY_TRACT
  Filled 2021-11-25 (×2): qty 3

## 2021-11-25 MED ORDER — HYDROCORTISONE (PERIANAL) 2.5 % EX CREA
TOPICAL_CREAM | Freq: Three times a day (TID) | CUTANEOUS | Status: DC
  Filled 2021-11-25: qty 28.35
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
## 2021-11-25 NOTE — Progress Notes (Signed)
?Progress Note ? ? ?Patient: Francisco Robinson VOJ:[id number] DOB: Jun 01, 1961 DOA: 11/24/2021     0 ?DOS: the patient was seen and examined on 11/25/2021 ?  ?Brief hospital course: ?Francisco Robinson is a 61 y.o. male with medical history significant of with past medical history significant for alcohol abuse, systolic congestive heart failure, tobacco abuse, COPD, type 2 diabetes without complications (following diet management only) and prior history of a stroke who presented to the hospital secondary to increased shortness of breath, swelling of his lower extremities, nonproductive coughing spells and orthopnea.  Symptoms has been present for the last 1 week and worsening.  Patient reports that his home inhalers are not helping him.  Patient continues to smoke, continues to drink alcohol and expressed some diet indiscretion (not following low-sodium). ?  ?In the emergency department BNP elevated, chest x-ray demonstrating interstitial edema and vascular congestion.  IV Lasix and Solu-Medrol has been provided.  TRH contacted to place patient in the hospital for further evaluation and management of COPD and CHF exacerbation. ?  ?Review of Systems: Patient reports burning sensation while swallowing and sore throat; no fever, no chest pain, no abdominal pain, no dysuria, no hematuria, no melena, no hematochezia, no focal weaknesses, no headaches or any other complaints. ? ?Assessment and Plan: ?* CHF exacerbation ([place]) ?-Patient with elevated BNP, interstitial edema and vascular congestion appreciated ?-Low-sodium diet, daily weights and strict I's and O's will be followed. ?-Patient still with SOB, orthopnea, LE swelling and positive crackles. ?-will continue IV lasix. ?-Last ejection fraction 30% on 2D echo in January 2023. ?-Echo will not be repeated this admission. ?-Medication compliance discussed with patient. ? ?COPD exacerbation ([place]) ?-Expiratory wheezing and decreased air movement appreciated during  admission. ?-Patient is slowly improving ?-Still complaining of short winded sensation with activity and is still having difficulty speaking in full sentences. ?-Will continue treatment with steroids, bronchodilators management and Pulmicort. ?-Flutter valve and PPI twice a day. ? ?Alcohol abuse ?-Cessation counseling provided ?-continue CIWA protocol. ?-Continue thiamine and folic acid. ? ?Hyponatremia ?-In the setting of chronic alcohol use ?-Appears to be chronic and currently at baseline ?-Continue to follow electrolytes trend. ?-sodium 128 currently ? ?HLD (hyperlipidemia) ?-Continue statins. ? ?Tobacco abuse ?-Cessation counseling provided ?-continue Nicotine patch. ? ?History of stroke ?-No complaints of acute neurologic deficits ?-Continue risk factor modifications. ? ? ? ? ?Subjective:  ?Afebrile, no chest pain, no nausea, no vomiting.  Still having difficulty speaking in full sentences, feeling short winded with activity and complaining of orthopnea. ? ?Physical Exam: ?Vitals:  ? 11/25/21 0454 11/25/21 0729 11/25/21 1204 11/25/21 1309  ?BP: (!) 120/96  (!) 114/93   ?Pulse: 98  91   ?Resp: 18  20   ?Temp: 98.6 ?F (37 ?C)  97.9 ?F (36.6 ?C)   ?TempSrc: Axillary     ?SpO2: 95% 97% 95% 95%  ?Weight:      ?Height:      ? ?General exam: Alert, awake, oriented x 3; experiencing difficulty speaking in full sentences, still complaining of orthopnea and short winded sensation with activity. ?Respiratory system: Positive rhonchi bilaterally; positive expiratory wheezing on examination and fine crackles at the bases.  Good saturation on 2 L nasal cannula currently. ?Cardiovascular system: No rubs, no gallops, no JVD; rate controlled in sinus rhythm. ?Gastrointestinal system: Abdomen is nondistended, soft and nontender. No organomegaly or masses felt. Normal bowel sounds heard. ?Central nervous system: Alert and oriented. No focal neurological deficits. ?Extremities: No cyanosis or clubbing; trace to 1+ edema  appreciated bilaterally. ?Skin: No petechiae. ?Psychiatry: Judgement and insight appear normal. Mood & affect appropriate.  ? ?Data Reviewed: ?Basic metabolic panel demonstrating sodium of 128, potassium 3.7, chloride 95, BUN 12 and creatinine 0.54 ?Patient is currently 1.5 L negative since admission ?Today's weight 54.9 kg. ? ?Family Communication: No family at bedside. ? ?Disposition: ?Status is: Inpatient ?Remains inpatient appropriate because: Still requiring IV diuresis, IVF status and bronchodilator management. ? ? Planned Discharge Destination: Home ? ? ?Author: ?Barton Dubois, MD ?11/25/2021 4:33 PM ? ?For on call review [email or web address].  ?

## 2021-11-26 DIAGNOSIS — I509 Heart failure, unspecified: Secondary | ICD-10-CM

## 2021-11-26 MED ORDER — SPIRITUS FRUMENTI
1.0000 | Freq: Every day | ORAL | Status: DC
  Filled 2021-11-26: qty 1

## 2021-11-26 NOTE — Progress Notes (Signed)
Pt has been awake and stating desire to "go home, have a beer, and smoke." Explained to patient that he is here for CHF and that he needs more medicine, and that he should stay. PRN IV ativan giver per MAR. Pt continued to express desire to leave, getting his bag, removing medical equipment, and putting on his clothes. Notified MD, order placed for pt to have beer. Pt -who is alert and oriented x4- agreed to stay. A few minutes later pt decided not to wait for the beer and started leaving the unit. Was redirected to sign AMA paper. Pt then proceeded back to his room and fell asleep on his bed. Pt woke up an hour later and said he was ready to leave. Directed pt to elevator, pt has left floor. MD aware. ? ?

## 2021-11-27 NOTE — Discharge Summary (Signed)
?Physician Discharge Summary ?  ?Patient: Francisco Robinson MRN: [id number] DOB: 08/24/1960  ?Admit date:     11/24/2021  ?Discharge date: 11/26/21  ?Discharge Physician: Barton Dubois  ? ?PCP: Carrolyn Meiers, MD  ? ?Recommendations at discharge:  ?Patient left after hour AMA. ? ?Discharge Diagnoses: ?Principal Problem: ?  CHF exacerbation ([place]) ?Active Problems: ?  COPD exacerbation ([place]) ?  Alcohol abuse ?  Hyponatremia ?  History of stroke ?  Tobacco abuse ?  HLD (hyperlipidemia) ? ? ?Brief hospital course: ?Francisco Robinson is a 61 y.o. male with medical history significant of with past medical history significant for alcohol abuse, systolic congestive heart failure, tobacco abuse, COPD, type 2 diabetes without complications (following diet management only) and prior history of a stroke who presented to the hospital secondary to increased shortness of breath, swelling of his lower extremities, nonproductive coughing spells and orthopnea.  Symptoms has been present for the last 1 week and worsening.  Patient reports that his home inhalers are not helping him.  Patient continues to smoke, continues to drink alcohol and expressed some diet indiscretion (not following low-sodium). ?  ?In the emergency department BNP elevated, chest x-ray demonstrating interstitial edema and vascular congestion.  IV Lasix and Solu-Medrol has been provided.  TRH contacted to place patient in the hospital for further evaluation and management of COPD and CHF exacerbation. ?  ?Review of Systems: Patient reports burning sensation while swallowing and sore throat; no fever, no chest pain, no abdominal pain, no dysuria, no hematuria, no melena, no hematochezia, no focal weaknesses, no headaches or any other complaints. ? ?Assessment and Plan: ?* CHF exacerbation ([place]) ?-Patient with elevated BNP, interstitial edema and vascular congestion appreciated ?-Low-sodium diet, daily weights and strict I's and O's will be followed. ?-Patient  still with SOB, orthopnea, LE swelling and positive crackles. ?-will continue IV lasix. ?-Last ejection fraction 30% on 2D echo in January 2023. ?-Echo will not be repeated this admission. ?-Medication compliance discussed with patient. ? ?COPD exacerbation ([place]) ?-Expiratory wheezing and decreased air movement appreciated during admission. ?-Patient is slowly improving ?-Still complaining of short winded sensation with activity and is still having difficulty speaking in full sentences. ?-Will continue treatment with steroids, bronchodilators management and Pulmicort. ?-Flutter valve and PPI twice a day. ? ?Alcohol abuse ?-Cessation counseling provided ?-continue CIWA protocol. ?-Continue thiamine and folic acid. ? ?Hyponatremia ?-In the setting of chronic alcohol use ?-Appears to be chronic and currently at baseline ?-Continue to follow electrolytes trend. ?-sodium 128 currently ? ?HLD (hyperlipidemia) ?-Continue statins. ? ?Tobacco abuse ?-Cessation counseling provided ?-continue Nicotine patch. ? ?History of stroke ?-No complaints of acute neurologic deficits ?-Continue risk factor modifications. ? ? ? ?*Patient left Against Medical Advice after hours. Please refer to last progress notes for details about active ongoing treatment and hospitalization needs. ? ?DISCHARGE MEDICATION: ?Allergies as of 11/26/2021   ?No Known Allergies ?  ? ?  ?Medication List  ?  ? ?ASK your doctor about these medications   ? ?albuterol 108 (90 Base) MCG/ACT inhaler ?Commonly known as: VENTOLIN HFA ?Inhale 2 puffs into the lungs every 6 (six) hours as needed for wheezing or shortness of breath. ?  ?aspirin 81 MG EC tablet ?Take 1 tablet (81 mg total) by mouth daily with breakfast. Swallow whole. ?  ?atorvastatin 40 MG tablet ?Commonly known as: LIPITOR ?Take 1 tablet (40 mg total) by mouth daily. ?  ?carvedilol 3.125 MG tablet ?Commonly known as: COREG ?Take 1 tablet (3.125 mg total) by  mouth 2 (two) times daily with a meal. ?   ?dextromethorphan-guaiFENesin 30-600 MG 12hr tablet ?Commonly known as: [place] DM ?Take 1 tablet by mouth 2 (two) times daily. ?  ?folic acid 1 MG tablet ?Commonly known as: FOLVITE ?Take 1 tablet (1 mg total) by mouth daily. ?  ?furosemide 20 MG tablet ?Commonly known as: LASIX ?Take 1 tablet (20 mg total) by mouth daily. ?  ?hydrALAZINE 25 MG tablet ?Commonly known as: APRESOLINE ?Take 1 tablet (25 mg total) by mouth every 8 (eight) hours. ?  ?hydrocortisone 25 MG suppository ?Commonly known as: ANUSOL-HC ?Place 1 suppository (25 mg total) rectally 2 (two) times daily. ?  ?isosorbide mononitrate 30 MG 24 hr tablet ?Commonly known as: IMDUR ?Take 0.5 tablets (15 mg total) by mouth daily. ?  ?losartan 25 MG tablet ?Commonly known as: COZAAR ?Take 1 tablet (25 mg total) by mouth daily. ?  ?multivitamin with minerals Tabs tablet ?Take 1 tablet by mouth daily. ?  ?nicotine 21 mg/24hr patch ?Commonly known as: NICODERM CQ - dosed in mg/24 hours ?Place 1 patch (21 mg total) onto the skin daily. ?  ?Symbicort 160-4.5 MCG/ACT inhaler ?Generic drug: budesonide-formoterol ?Inhale 1 puff into the lungs 2 (two) times daily. ?  ?thiamine 100 MG tablet ?Take 1 tablet (100 mg total) by mouth daily. ?  ? ?  ? ? ?Discharge Exam: ?Filed Weights  ? 11/24/21 0453  ?Weight: 54.9 kg  ? ?Patient left AMA. ? ?Condition at discharge: patient left AMA after hours. Refer to nurses notes for details. ? ?The results of significant diagnostics from this hospitalization (including imaging, microbiology, ancillary and laboratory) are listed below for reference.  ? ?Imaging Studies: ?DG Chest Portable 1 View ? ?Result Date: 11/24/2021 ?CLINICAL DATA:  Wheezing and dyspnea. Shortness of breath with nasal congestion. EXAM: PORTABLE CHEST 1 VIEW COMPARISON:  10/28/2021 FINDINGS: Stable cardiac enlargement. Aortic atherosclerosis. Moderate right pleural effusion with overlying subsegmental atelectasis noted. Mild diffuse interstitial edema. No  airspace opacities. IMPRESSION: 1. Right pleural effusion and mild interstitial edema compatible with CHF. Electronically Signed   By: Kerby Moors M.D.   On: 11/24/2021 05:51   ? ?Microbiology: ?Results for orders placed or performed during the hospital encounter of 10/28/21  ?Resp Panel by RT-PCR (Flu A&B, Covid) Nasopharyngeal Swab     Status: None  ? Collection Time: 10/28/21 12:41 PM  ? Specimen: Nasopharyngeal Swab; Nasopharyngeal(NP) swabs in vial transport medium  ?Result Value Ref Range Status  ? SARS Coronavirus 2 by RT PCR NEGATIVE NEGATIVE Final  ?  Comment: (NOTE) ?SARS-CoV-2 target nucleic acids are NOT DETECTED. ? ?The SARS-CoV-2 RNA is generally detectable in upper respiratory ?specimens during the acute phase of infection. The lowest ?concentration of SARS-CoV-2 viral copies this assay can detect is ?138 copies/mL. A negative result does not preclude SARS-Cov-2 ?infection and should not be used as the sole basis for treatment or ?other patient management decisions. A negative result may occur with  ?improper specimen collection/handling, submission of specimen other ?than nasopharyngeal swab, presence of viral mutation(s) within the ?areas targeted by this assay, and inadequate number of viral ?copies(<138 copies/mL). A negative result must be combined with ?clinical observations, patient history, and epidemiological ?information. The expected result is Negative. ? ?Fact Sheet for Patients:  ?EntrepreneurPulse.com.au ? ?Fact Sheet for Healthcare Providers:  ?IncredibleEmployment.be ? ?This test is no t yet approved or cleared by the [place] FDA and  ?has been authorized for detection and/or diagnosis of SARS-CoV-2 by ?FDA under an  Emergency Use Authorization (EUA). This EUA will remain  ?in effect (meaning this test can be used) for the duration of the ?COVID-19 declaration under Section 564(b)(1) of the Act, 21 ?U.S.C.section 360bbb-3(b)(1), unless the  authorization is terminated  ?or revoked sooner.  ? ? ?  ? Influenza A by PCR NEGATIVE NEGATIVE Final  ? Influenza B by PCR NEGATIVE NEGATIVE Final  ?  Comment: (NOTE) ?The Xpert Xpress SARS-CoV-2/FLU/RSV plus assay i

## 2021-12-13 ENCOUNTER — Other Ambulatory Visit (HOSPITAL_COMMUNITY): Payer: Medicaid Other

## 2021-12-13 ENCOUNTER — Emergency Department (HOSPITAL_COMMUNITY): Payer: Medicaid Other

## 2021-12-13 ENCOUNTER — Encounter (HOSPITAL_COMMUNITY): Payer: Self-pay | Admitting: Emergency Medicine

## 2021-12-13 ENCOUNTER — Other Ambulatory Visit: Payer: Self-pay

## 2021-12-13 ENCOUNTER — Inpatient Hospital Stay (HOSPITAL_COMMUNITY)
Admission: EM | Admit: 2021-12-13 | Discharge: 2021-12-15 | DRG: 291 | Discharge status: Against Medical Advice | Payer: Medicaid Other | Attending: Internal Medicine | Admitting: Internal Medicine

## 2021-12-13 DIAGNOSIS — J441 Chronic obstructive pulmonary disease with (acute) exacerbation: Secondary | ICD-10-CM | POA: Diagnosis present

## 2021-12-13 DIAGNOSIS — E119 Type 2 diabetes mellitus without complications: Secondary | ICD-10-CM | POA: Diagnosis present

## 2021-12-13 DIAGNOSIS — I5023 Acute on chronic systolic (congestive) heart failure: Secondary | ICD-10-CM | POA: Diagnosis not present

## 2021-12-13 DIAGNOSIS — J9601 Acute respiratory failure with hypoxia: Secondary | ICD-10-CM | POA: Diagnosis present

## 2021-12-13 DIAGNOSIS — I2510 Atherosclerotic heart disease of native coronary artery without angina pectoris: Secondary | ICD-10-CM | POA: Diagnosis present

## 2021-12-13 DIAGNOSIS — R778 Other specified abnormalities of plasma proteins: Secondary | ICD-10-CM | POA: Diagnosis not present

## 2021-12-13 DIAGNOSIS — I509 Heart failure, unspecified: Secondary | ICD-10-CM

## 2021-12-13 DIAGNOSIS — Z20822 Contact with and (suspected) exposure to covid-19: Secondary | ICD-10-CM | POA: Diagnosis present

## 2021-12-13 DIAGNOSIS — F10239 Alcohol dependence with withdrawal, unspecified: Secondary | ICD-10-CM | POA: Diagnosis present

## 2021-12-13 DIAGNOSIS — K761 Chronic passive congestion of liver: Secondary | ICD-10-CM | POA: Diagnosis present

## 2021-12-13 DIAGNOSIS — F1011 Alcohol abuse, in remission: Secondary | ICD-10-CM | POA: Diagnosis present

## 2021-12-13 DIAGNOSIS — Z7951 Long term (current) use of inhaled steroids: Secondary | ICD-10-CM | POA: Diagnosis not present

## 2021-12-13 DIAGNOSIS — Z8673 Personal history of transient ischemic attack (TIA), and cerebral infarction without residual deficits: Secondary | ICD-10-CM

## 2021-12-13 DIAGNOSIS — E871 Hypo-osmolality and hyponatremia: Secondary | ICD-10-CM | POA: Diagnosis present

## 2021-12-13 DIAGNOSIS — Z91199 Patient's noncompliance with other medical treatment and regimen due to unspecified reason: Secondary | ICD-10-CM | POA: Diagnosis not present

## 2021-12-13 DIAGNOSIS — I252 Old myocardial infarction: Secondary | ICD-10-CM

## 2021-12-13 DIAGNOSIS — I10 Essential (primary) hypertension: Secondary | ICD-10-CM

## 2021-12-13 DIAGNOSIS — Z720 Tobacco use: Secondary | ICD-10-CM | POA: Diagnosis present

## 2021-12-13 DIAGNOSIS — R7989 Other specified abnormal findings of blood chemistry: Secondary | ICD-10-CM | POA: Diagnosis not present

## 2021-12-13 DIAGNOSIS — E782 Mixed hyperlipidemia: Secondary | ICD-10-CM

## 2021-12-13 DIAGNOSIS — I110 Hypertensive heart disease with heart failure: Principal | ICD-10-CM | POA: Diagnosis present

## 2021-12-13 DIAGNOSIS — I5043 Acute on chronic combined systolic (congestive) and diastolic (congestive) heart failure: Secondary | ICD-10-CM | POA: Insufficient documentation

## 2021-12-13 DIAGNOSIS — F17210 Nicotine dependence, cigarettes, uncomplicated: Secondary | ICD-10-CM | POA: Diagnosis present

## 2021-12-13 DIAGNOSIS — Z79899 Other long term (current) drug therapy: Secondary | ICD-10-CM | POA: Diagnosis not present

## 2021-12-13 DIAGNOSIS — Z5329 Procedure and treatment not carried out because of patient's decision for other reasons: Secondary | ICD-10-CM | POA: Diagnosis present

## 2021-12-13 DIAGNOSIS — J449 Chronic obstructive pulmonary disease, unspecified: Secondary | ICD-10-CM | POA: Diagnosis present

## 2021-12-13 DIAGNOSIS — Z91148 Patient's other noncompliance with medication regimen for other reason: Secondary | ICD-10-CM

## 2021-12-13 LAB — CBC
HCT: 36.8 % — ABNORMAL LOW (ref 39.0–52.0)
Hemoglobin: 12.1 g/dL — ABNORMAL LOW (ref 13.0–17.0)
MCH: 32.4 pg (ref 26.0–34.0)
MCHC: 32.9 g/dL (ref 30.0–36.0)
MCV: 98.4 fL (ref 80.0–100.0)
Platelets: 133 10*3/uL — ABNORMAL LOW (ref 150–400)
RBC: 3.74 MIL/uL — ABNORMAL LOW (ref 4.22–5.81)
RDW: 17.8 % — ABNORMAL HIGH (ref 11.5–15.5)
WBC: 13 10*3/uL — ABNORMAL HIGH (ref 4.0–10.5)
nRBC: 0 % (ref 0.0–0.2)

## 2021-12-13 LAB — BASIC METABOLIC PANEL
Anion gap: 10 (ref 5–15)
Anion gap: 12 (ref 5–15)
BUN: 11 mg/dL (ref 6–20)
BUN: 14 mg/dL (ref 6–20)
CO2: 28 mmol/L (ref 22–32)
CO2: 30 mmol/L (ref 22–32)
Calcium: 8.2 mg/dL — ABNORMAL LOW (ref 8.9–10.3)
Calcium: 8.7 mg/dL — ABNORMAL LOW (ref 8.9–10.3)
Chloride: 89 mmol/L — ABNORMAL LOW (ref 98–111)
Chloride: 88 mmol/L — ABNORMAL LOW (ref 98–111)
Creatinine, Ser: 0.59 mg/dL — ABNORMAL LOW (ref 0.61–1.24)
Creatinine, Ser: 0.79 mg/dL (ref 0.61–1.24)
GFR, Estimated: >60 mL/min (ref >60)
GFR, Estimated: >60 mL/min (ref >60)
Glucose, Bld: 127 mg/dL — ABNORMAL HIGH (ref 70–99)
Glucose, Bld: 183 mg/dL — ABNORMAL HIGH (ref 70–99)
Potassium: 2.9 mmol/L — ABNORMAL LOW (ref 3.5–5.1)
Potassium: 3.7 mmol/L (ref 3.5–5.1)
Sodium: 127 mmol/L — ABNORMAL LOW (ref 135–145)
Sodium: 130 mmol/L — ABNORMAL LOW (ref 135–145)

## 2021-12-13 LAB — HEMOGLOBIN A1C
Hgb A1c MFr Bld: 5.2 % (ref 4.8–5.6)
Mean Plasma Glucose: 102.54 mg/dL

## 2021-12-13 LAB — BRAIN NATRIURETIC PEPTIDE: B Natriuretic Peptide: >4951 pg/mL — ABNORMAL HIGH (ref 0.0–100.0)

## 2021-12-13 LAB — MAGNESIUM: Magnesium: 1.5 mg/dL — ABNORMAL LOW (ref 1.7–2.4)

## 2021-12-13 LAB — TROPONIN I (HIGH SENSITIVITY)
Troponin I (High Sensitivity): 22 ng/L — ABNORMAL HIGH (ref <18)
Troponin I (High Sensitivity): 20 ng/L — ABNORMAL HIGH (ref <18)

## 2021-12-13 LAB — GLUCOSE, CAPILLARY
Glucose-Capillary: 152 mg/dL — ABNORMAL HIGH (ref 70–99)
Glucose-Capillary: 156 mg/dL — ABNORMAL HIGH (ref 70–99)
Glucose-Capillary: 173 mg/dL — ABNORMAL HIGH (ref 70–99)

## 2021-12-13 LAB — OSMOLALITY: Osmolality: 265 mOsm/kg — ABNORMAL LOW (ref 275–295)

## 2021-12-13 LAB — RESP PANEL BY RT-PCR (FLU A&B, COVID) ARPGX2
Influenza A by PCR: NEGATIVE
Influenza B by PCR: NEGATIVE
SARS Coronavirus 2 by RT PCR: NEGATIVE

## 2021-12-13 LAB — MRSA NEXT GEN BY PCR, NASAL: MRSA by PCR Next Gen: NOT DETECTED

## 2021-12-13 LAB — GROUP A STREP BY PCR: Group A Strep by PCR: NOT DETECTED

## 2021-12-13 MED ORDER — CARVEDILOL 3.125 MG PO TABS: 3.1250 mg | ORAL_TABLET | Freq: Two times a day (BID) | ORAL | Status: DC

## 2021-12-13 MED ORDER — FUROSEMIDE 10 MG/ML IJ SOLN
40.0000 mg | Freq: Once | INTRAMUSCULAR | Status: AC
  Administered 2021-12-13: 40 mg via INTRAVENOUS
  Filled 2021-12-13: qty 4

## 2021-12-13 MED ORDER — LORAZEPAM 1 MG PO TABS
1.0000 mg | ORAL_TABLET | ORAL | Status: DC | PRN
  Administered 2021-12-14 (×2): 2 mg via ORAL
  Administered 2021-12-15: 4 mg via ORAL
  Filled 2021-12-13 (×2): qty 2
  Filled 2021-12-13: qty 4
  Filled 2021-12-13: qty 2

## 2021-12-13 MED ORDER — CHLORHEXIDINE GLUCONATE CLOTH 2 % EX PADS
6.0000 | MEDICATED_PAD | Freq: Every day | CUTANEOUS | Status: DC
  Administered 2021-12-13 – 2021-12-14 (×3): 6 via TOPICAL

## 2021-12-13 MED ORDER — ASPIRIN EC 81 MG PO TBEC
81.0000 mg | DELAYED_RELEASE_TABLET | Freq: Every day | ORAL | Status: DC
  Administered 2021-12-14 – 2021-12-15 (×2): 81 mg via ORAL
  Filled 2021-12-13 (×2): qty 1

## 2021-12-13 MED ORDER — POTASSIUM CHLORIDE CRYS ER 20 MEQ PO TBCR
40.0000 meq | EXTENDED_RELEASE_TABLET | ORAL | Status: AC
  Administered 2021-12-13 (×3): 40 meq via ORAL
  Filled 2021-12-13 (×4): qty 2

## 2021-12-13 MED ORDER — NYSTATIN 100000 UNIT/ML MT SUSP
5.0000 mL | Freq: Four times a day (QID) | OROMUCOSAL | Status: DC
  Administered 2021-12-13 – 2021-12-15 (×7): 500000 [IU] via ORAL
  Filled 2021-12-13 (×8): qty 5

## 2021-12-13 MED ORDER — THIAMINE HCL 100 MG PO TABS
100.0000 mg | ORAL_TABLET | Freq: Every day | ORAL | Status: DC
  Administered 2021-12-13 – 2021-12-15 (×3): 100 mg via ORAL
  Filled 2021-12-13 (×3): qty 1

## 2021-12-13 MED ORDER — ENOXAPARIN SODIUM 40 MG/0.4ML IJ SOSY
40.0000 mg | PREFILLED_SYRINGE | Freq: Every day | INTRAMUSCULAR | Status: DC
  Administered 2021-12-13 – 2021-12-15 (×3): 40 mg via SUBCUTANEOUS
  Filled 2021-12-13 (×3): qty 0.4

## 2021-12-13 MED ORDER — IPRATROPIUM-ALBUTEROL 0.5-2.5 (3) MG/3ML IN SOLN
3.0000 mL | Freq: Four times a day (QID) | RESPIRATORY_TRACT | Status: DC
Start: 2021-12-13 — End: 2021-12-15
  Administered 2021-12-13 – 2021-12-15 (×8): 3 mL via RESPIRATORY_TRACT
  Filled 2021-12-13 (×8): qty 3

## 2021-12-13 MED ORDER — CARVEDILOL 3.125 MG PO TABS
6.2500 mg | ORAL_TABLET | Freq: Two times a day (BID) | ORAL | Status: DC
  Administered 2021-12-14 – 2021-12-15 (×3): 6.25 mg via ORAL
  Filled 2021-12-13 (×3): qty 2

## 2021-12-13 MED ORDER — CHLORHEXIDINE GLUCONATE 0.12 % MT SOLN
15.0000 mL | Freq: Two times a day (BID) | OROMUCOSAL | Status: DC
  Administered 2021-12-13 – 2021-12-14 (×3): 15 mL via OROMUCOSAL
  Filled 2021-12-13 (×3): qty 15

## 2021-12-13 MED ORDER — PANTOPRAZOLE SODIUM 40 MG PO TBEC
40.0000 mg | DELAYED_RELEASE_TABLET | Freq: Every day | ORAL | Status: DC
  Administered 2021-12-13 – 2021-12-15 (×3): 40 mg via ORAL
  Filled 2021-12-13 (×3): qty 1

## 2021-12-13 MED ORDER — ADULT MULTIVITAMIN W/MINERALS CH
1.0000 | ORAL_TABLET | Freq: Every day | ORAL | Status: DC
  Administered 2021-12-13 – 2021-12-15 (×3): 1 via ORAL
  Filled 2021-12-13 (×3): qty 1

## 2021-12-13 MED ORDER — HYDRALAZINE HCL 25 MG PO TABS
25.0000 mg | ORAL_TABLET | Freq: Three times a day (TID) | ORAL | Status: DC
  Administered 2021-12-13 – 2021-12-14 (×2): 25 mg via ORAL
  Filled 2021-12-13 (×2): qty 1

## 2021-12-13 MED ORDER — INSULIN ASPART 100 UNIT/ML IJ SOLN
0.0000 [IU] | Freq: Three times a day (TID) | INTRAMUSCULAR | Status: DC
  Administered 2021-12-13 – 2021-12-14 (×3): 2 [IU] via SUBCUTANEOUS
  Administered 2021-12-14 – 2021-12-15 (×3): 1 [IU] via SUBCUTANEOUS

## 2021-12-13 MED ORDER — CHLORHEXIDINE GLUCONATE CLOTH 2 % EX PADS
6.0000 | MEDICATED_PAD | Freq: Every day | CUTANEOUS | Status: DC
  Administered 2021-12-13: 6 via TOPICAL

## 2021-12-13 MED ORDER — AZITHROMYCIN 250 MG PO TABS
500.0000 mg | ORAL_TABLET | Freq: Every day | ORAL | Status: AC
  Administered 2021-12-13: 500 mg via ORAL
  Filled 2021-12-13: qty 2

## 2021-12-13 MED ORDER — AZITHROMYCIN 250 MG PO TABS
250.0000 mg | ORAL_TABLET | Freq: Every day | ORAL | Status: DC
  Administered 2021-12-14 – 2021-12-15 (×2): 250 mg via ORAL
  Filled 2021-12-13 (×2): qty 1

## 2021-12-13 MED ORDER — LORAZEPAM 2 MG/ML IJ SOLN
1.0000 mg | INTRAMUSCULAR | Status: DC | PRN
  Administered 2021-12-13: 3 mg via INTRAVENOUS
  Administered 2021-12-14: 1 mg via INTRAVENOUS
  Administered 2021-12-14: 2 mg via INTRAVENOUS
  Administered 2021-12-14: 1 mg via INTRAVENOUS
  Administered 2021-12-15 (×3): 2 mg via INTRAVENOUS
  Filled 2021-12-13: qty 2
  Filled 2021-12-13 (×6): qty 1

## 2021-12-13 MED ORDER — ORAL CARE MOUTH RINSE
15.0000 mL | Freq: Two times a day (BID) | OROMUCOSAL | Status: DC
  Administered 2021-12-14 (×2): 15 mL via OROMUCOSAL

## 2021-12-13 MED ORDER — LOSARTAN POTASSIUM 25 MG PO TABS
25.0000 mg | ORAL_TABLET | Freq: Every day | ORAL | Status: DC
  Administered 2021-12-13: 25 mg via ORAL
  Filled 2021-12-13: qty 1

## 2021-12-13 MED ORDER — IPRATROPIUM-ALBUTEROL 0.5-2.5 (3) MG/3ML IN SOLN
3.0000 mL | RESPIRATORY_TRACT | Status: DC | PRN
  Administered 2021-12-15: 3 mL via RESPIRATORY_TRACT
  Filled 2021-12-13: qty 3

## 2021-12-13 MED ORDER — METHYLPREDNISOLONE SODIUM SUCC 40 MG IJ SOLR
40.0000 mg | Freq: Two times a day (BID) | INTRAMUSCULAR | Status: DC
  Administered 2021-12-13 – 2021-12-14 (×3): 40 mg via INTRAVENOUS
  Filled 2021-12-13 (×3): qty 1

## 2021-12-13 MED ORDER — MENTHOL 3 MG MT LOZG
1.0000 | LOZENGE | OROMUCOSAL | Status: DC | PRN
  Administered 2021-12-13 – 2021-12-15 (×3): 3 mg via ORAL
  Filled 2021-12-13 (×3): qty 9

## 2021-12-13 MED ORDER — DM-GUAIFENESIN ER 30-600 MG PO TB12
1.0000 | ORAL_TABLET | Freq: Two times a day (BID) | ORAL | Status: DC
Start: 2021-12-13 — End: 2021-12-15
  Administered 2021-12-13 – 2021-12-15 (×5): 1 via ORAL
  Filled 2021-12-13 (×5): qty 1

## 2021-12-13 MED ORDER — NICOTINE 21 MG/24HR TD PT24
21.0000 mg | MEDICATED_PATCH | Freq: Every day | TRANSDERMAL | Status: DC
  Administered 2021-12-13 – 2021-12-15 (×3): 21 mg via TRANSDERMAL
  Filled 2021-12-13 (×3): qty 1

## 2021-12-13 MED ORDER — METHYLPREDNISOLONE SODIUM SUCC 125 MG IJ SOLR
125.0000 mg | Freq: Once | INTRAMUSCULAR | Status: AC
  Administered 2021-12-13: 125 mg via INTRAVENOUS
  Filled 2021-12-13: qty 2

## 2021-12-13 MED ORDER — ISOSORBIDE MONONITRATE ER 30 MG PO TB24
15.0000 mg | ORAL_TABLET | Freq: Every day | ORAL | Status: DC
  Administered 2021-12-13 – 2021-12-15 (×3): 15 mg via ORAL
  Filled 2021-12-13 (×3): qty 1

## 2021-12-13 MED ORDER — FOLIC ACID 1 MG PO TABS
1.0000 mg | ORAL_TABLET | Freq: Every day | ORAL | Status: DC
  Administered 2021-12-13 – 2021-12-15 (×3): 1 mg via ORAL
  Filled 2021-12-13 (×3): qty 1

## 2021-12-13 MED ORDER — MOMETASONE FURO-FORMOTEROL FUM 200-5 MCG/ACT IN AERO
2.0000 | INHALATION_SPRAY | Freq: Two times a day (BID) | RESPIRATORY_TRACT | Status: DC
  Administered 2021-12-14 – 2021-12-15 (×3): 2 via RESPIRATORY_TRACT
  Filled 2021-12-13: qty 8.8

## 2021-12-13 MED ORDER — LABETALOL HCL 5 MG/ML IV SOLN
5.0000 mg | INTRAVENOUS | Status: DC | PRN
  Administered 2021-12-13: 5 mg via INTRAVENOUS
  Filled 2021-12-13: qty 4

## 2021-12-13 MED ORDER — ATORVASTATIN CALCIUM 40 MG PO TABS
40.0000 mg | ORAL_TABLET | Freq: Every day | ORAL | Status: DC
  Administered 2021-12-13 – 2021-12-15 (×3): 40 mg via ORAL
  Filled 2021-12-13 (×3): qty 1

## 2021-12-13 MED ORDER — FUROSEMIDE 10 MG/ML IJ SOLN
40.0000 mg | Freq: Two times a day (BID) | INTRAMUSCULAR | Status: DC
  Administered 2021-12-13 (×2): 40 mg via INTRAVENOUS
  Filled 2021-12-13 (×2): qty 4

## 2021-12-13 MED ORDER — PHENOL 1.4 % MT LIQD
1.0000 | OROMUCOSAL | Status: DC | PRN
  Administered 2021-12-13 – 2021-12-14 (×3): 1 via OROMUCOSAL
  Filled 2021-12-13: qty 177

## 2021-12-13 NOTE — ED Notes (Signed)
Pt receiving nebulizer ?

## 2021-12-13 NOTE — Progress Notes (Signed)
Patient running ST with HR sustaining in the 130s at rest, MD made aware  ?

## 2021-12-13 NOTE — Progress Notes (Signed)
Did not try to give dulera inhaler as patient is very sleepy still. ?
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
## 2021-12-13 NOTE — ED Notes (Signed)
Large purple bruise noted to right elbow area. Pt verbalized it has been there. Pt verbalized he doesn't want the bipap on.  at 2lpm and oxygen at 95%. Pt changed into a gown and clothes removed. Clothes placed into a bag.  ?

## 2021-12-13 NOTE — ED Triage Notes (Signed)
Pt brought in [facility] c/o sore throat, and shortness of breath. ?

## 2021-12-13 NOTE — Progress Notes (Signed)
?  PROGRESS NOTE ? ?Checked in on patient this afternoon. Discussed with RN. Patient received Ativan per CIWA protocol.  He is sleeping comfortably in stepdown unit. ? ? ?Dessa Phi, DO ?[facility] ?12/13/2021, 4:26 PM ? ?Available via Epic secure chat 7am-7pm ?After these hours, please refer to coverage provider listed on [email or web address]  ? ?

## 2021-12-13 NOTE — Progress Notes (Signed)
?  PROGRESS NOTE ? ?Patient admitted earlier this morning. See H&P.  ? ?Francisco Robinson is a 61 y.o. male with medical history significant of systolic CHF, COPD, type II DM, tobacco abuse, alcohol abuse who presented to the emergency department due to sore throat and shortness of breath. He complained of 1 week onset of worsening shortness of breath and bilateral leg swelling and also complaining of sore throat.   ? ?He has had numerous hospitalizations in 2023: ?-April 17 to April 19 admitted for CHF exacerbation, left AMA ?-March 21 to March 23 admitted for CHF exacerbation, left AMA ?-February 26 to February 28 admitted for CHF exacerbation and COPD exacerbation, discharged on oral Lasix and prednisone ?-January 8 to January 10 admitted for CHF exacerbation and COPD exacerbation, left AMA.  Echocardiogram revealed severe hypokinesis, EF 30%. ? ?In the emergency department during this hospital stay, patient was found to have BNP >4951.  Patient required BiPAP due to work of breathing and hypoxia.  Patient was given IV Lasix and IV Solu-Medrol and admitted for CHF exacerbation, COPD exacerbation. ? ?Patient is seen in the emergency department.  He is weaned to room air.  He is asking for Ativan, states that he drinks 12 beers every night, last drink was 5/5 PM. ? ?A/P: ?Continue IV Lasix, strict I's and O's, daily weight and fluid restriction ?Continue Solu-Medrol, azithromycin, breathing treatments ?Replace potassium ?CIWA protocol for alcohol withdrawal ?Unfortunately high risk of readmission due to medical noncompliance ? ? ? ?Status is: Inpatient ?Remains inpatient appropriate because: IV lasix, IV solumedrol, CIWA  ? ? ?Dessa Phi, DO ?[facility] ?12/13/2021, 10:42 AM ? ?Available via Epic secure chat 7am-7pm ?After these hours, please refer to coverage provider listed on [email or web address]  ? ?

## 2021-12-13 NOTE — ED Notes (Signed)
Patient removed bipap and requested to be placed on a nasal cannula. Patient placed on nasal cannula at 2 lpm.  ?

## 2021-12-13 NOTE — Progress Notes (Signed)
Patient asking for something for sore throat, upon assessment noted that he does have raised bump areas on his tongue/throat/roof of mouth/buccal areas, white patch area noted on LEFT side of throat, MD notified  ?
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
## 2021-12-14 LAB — COMPREHENSIVE METABOLIC PANEL
ALT: 45 U/L — ABNORMAL HIGH (ref 0–44)
AST: 108 U/L — ABNORMAL HIGH (ref 15–41)
Albumin: 2.7 g/dL — ABNORMAL LOW (ref 3.5–5.0)
Alkaline Phosphatase: 108 U/L (ref 38–126)
Anion gap: 9 (ref 5–15)
BUN: 15 mg/dL (ref 6–20)
CO2: 29 mmol/L (ref 22–32)
Calcium: 8.6 mg/dL — ABNORMAL LOW (ref 8.9–10.3)
Chloride: 93 mmol/L — ABNORMAL LOW (ref 98–111)
Creatinine, Ser: 0.5 mg/dL — ABNORMAL LOW (ref 0.61–1.24)
GFR, Estimated: >60 mL/min (ref >60)
Glucose, Bld: 140 mg/dL — ABNORMAL HIGH (ref 70–99)
Potassium: 3.6 mmol/L (ref 3.5–5.1)
Sodium: 131 mmol/L — ABNORMAL LOW (ref 135–145)
Total Bilirubin: 2.2 mg/dL — ABNORMAL HIGH (ref 0.3–1.2)
Total Protein: 6 g/dL — ABNORMAL LOW (ref 6.5–8.1)

## 2021-12-14 LAB — CBC
HCT: 36 % — ABNORMAL LOW (ref 39.0–52.0)
Hemoglobin: 12 g/dL — ABNORMAL LOW (ref 13.0–17.0)
MCH: 32.7 pg (ref 26.0–34.0)
MCHC: 33.3 g/dL (ref 30.0–36.0)
MCV: 98.1 fL (ref 80.0–100.0)
Platelets: 156 10*3/uL (ref 150–400)
RBC: 3.67 MIL/uL — ABNORMAL LOW (ref 4.22–5.81)
RDW: 17.6 % — ABNORMAL HIGH (ref 11.5–15.5)
WBC: 12 10*3/uL — ABNORMAL HIGH (ref 4.0–10.5)
nRBC: 0 % (ref 0.0–0.2)

## 2021-12-14 LAB — APTT: aPTT: 31 seconds (ref 24–36)

## 2021-12-14 LAB — GLUCOSE, CAPILLARY
Glucose-Capillary: 150 mg/dL — ABNORMAL HIGH (ref 70–99)
Glucose-Capillary: 165 mg/dL — ABNORMAL HIGH (ref 70–99)
Glucose-Capillary: 148 mg/dL — ABNORMAL HIGH (ref 70–99)
Glucose-Capillary: 177 mg/dL — ABNORMAL HIGH (ref 70–99)

## 2021-12-14 MED ORDER — PREDNISONE 10 MG PO TABS
50.0000 mg | ORAL_TABLET | Freq: Every day | ORAL | Status: DC
  Administered 2021-12-15: 50 mg via ORAL
  Filled 2021-12-14: qty 1

## 2021-12-14 MED ORDER — POTASSIUM CHLORIDE CRYS ER 20 MEQ PO TBCR
40.0000 meq | EXTENDED_RELEASE_TABLET | Freq: Once | ORAL | Status: AC
  Administered 2021-12-14: 40 meq via ORAL
  Filled 2021-12-14: qty 2

## 2021-12-14 MED ORDER — FUROSEMIDE 10 MG/ML IJ SOLN
80.0000 mg | Freq: Two times a day (BID) | INTRAMUSCULAR | Status: DC
  Administered 2021-12-14 – 2021-12-15 (×3): 80 mg via INTRAVENOUS
  Filled 2021-12-14 (×3): qty 8

## 2021-12-14 NOTE — Progress Notes (Addendum)
?PROGRESS NOTE ? ? ? ?Francisco Robinson  VHQ:[id number] DOB: 18-Nov-1960 DOA: 12/13/2021 ?PCP: Carrolyn Meiers, MD  ? ?  ?Brief Narrative:  ?Francisco Robinson is a 61 y.o. male with medical history significant of systolic CHF, COPD, type II DM, tobacco abuse, alcohol abuse who presented to the emergency department due to sore throat and shortness of breath. He complained of 1 week onset of worsening shortness of breath and bilateral leg swelling and also complaining of sore throat.   ?  ?He has had numerous hospitalizations in 2023: ?-April 17 to April 19 admitted for CHF exacerbation, left AMA ?-March 21 to March 23 admitted for CHF exacerbation, left AMA ?-February 26 to February 28 admitted for CHF exacerbation and COPD exacerbation, discharged on oral Lasix and prednisone ?-January 8 to January 10 admitted for CHF exacerbation and COPD exacerbation, left AMA.  Echocardiogram revealed severe hypokinesis, EF 30%. ? ?In the emergency department during this hospital stay, patient was found to have BNP >4951.  Patient required BiPAP due to work of breathing and hypoxia.  Patient was given IV Lasix and IV Solu-Medrol and admitted for CHF exacerbation, COPD exacerbation. ? ?New events last 24 hours / Subjective: ?Patient somnolent on examination.  No acute events overnight.  Wore BiPAP overnight, took off about 6 AM. ? ?Assessment & Plan: ? Principal Problem: ?  Acute on chronic combined systolic and diastolic CHF (congestive heart failure) ([place]) ?Active Problems: ?  COPD exacerbation ([place]) ?  Acute respiratory failure with hypoxia ([place]) ?  Hyponatremia ?  History of ETOH abuse ?  Noncompliance with medication regimen ?  Tobacco abuse ?  Mixed hyperlipidemia ?  Elevated troponin ?  Elevated brain natriuretic peptide (BNP) level ?  Essential hypertension ? ? ?Acute hypoxemic respiratory failure secondary to acute on chronic combined systolic and diastolic heart failure, COPD exacerbation ?-Echocardiogram revealed  severe hypokinesis, EF 30% in January 2023. Has been medically noncompliant with repeat hospitalizations ?-Briefly required BiPAP, now weaned to 3 L ?-Continue IV Lasix, coreg ?-Strict I's and O's, daily weight, fluid restriction ? ?COPD exacerbation ?-Wean Solu-Medrol to prednisone, azithromycin, breathing treatments ? ?Alcohol dependence and withdrawal ?-Continue CIWA protocol ? ?Transaminitis ?-Likely due to congestive hepatopathy in setting of CHF ?-Trend ? ?Diabetes mellitus ?-Sliding scale insulin ? ?CAD ?-Aspirin Lipitor Imdur ? ? ?DVT prophylaxis:  ?enoxaparin (LOVENOX) injection 40 mg Start: 12/13/21 1030 ?SCDs Start: 12/13/21 0930 ? ?Code Status: Full ?Family Communication: None at bedside ?Disposition Plan:  ?Status is: Inpatient ?Remains inpatient appropriate because: IV lasix, CIWA ? ? ? ?Antimicrobials:  ?Anti-infectives (From admission, onward)  ? ? Start     Dose/Rate Route Frequency Ordered Stop  ? 12/14/21 1000  azithromycin (ZITHROMAX) tablet 250 mg       ?See Hyperspace for full Linked Orders Report.  ? 250 mg Oral Daily 12/13/21 0743 12/18/21 0959  ? 12/13/21 1000  azithromycin (ZITHROMAX) tablet 500 mg       ?See Hyperspace for full Linked Orders Report.  ? 500 mg Oral Daily 12/13/21 0743 12/13/21 1214  ? ?  ? ? ? ?Objective: ?Vitals:  ? 12/14/21 0400 12/14/21 0423 12/14/21 0500 12/14/21 0846  ?BP: 96/67  102/77   ?Pulse: (!) 107 (!) 110 (!) 107   ?Resp: 19 18 (!) 7   ?Temp: (!) 97.5 ?F (36.4 ?C)     ?TempSrc: Axillary     ?SpO2: 100% 100% 100% 99%  ?Weight:   54.9 kg   ?Height:      ? ? ?  Intake/Output Summary (Last 24 hours) at 12/14/2021 1056 ?Last data filed at 12/13/2021 2358 ?Gross per 24 hour  ?Intake 250 ml  ?Output 650 ml  ?Net -400 ml  ? ?Filed Weights  ? 12/13/21 0147 12/14/21 0500  ?Weight: 54.9 kg 54.9 kg  ? ? ?Examination:  ?General exam: Appears calm and comfortable, sleeping  ?Respiratory system: Diminished breath sounds without wheezing ?Cardiovascular system: S1 & S2 heard, RRR. No  murmurs. No pedal edema. ?Gastrointestinal system: Abdomen is nondistended, soft  ?Extremities: Symmetric in appearance  ?Skin: No rashes, lesions or ulcers on exposed skin  ? ?Data Reviewed: I have personally reviewed following labs and imaging studies ? ?CBC: ?Recent Labs  ?Lab 12/13/21 ?0214 12/14/21 ?6295  ?WBC 13.0* 12.0*  ?HGB 12.1* 12.0*  ?HCT 36.8* 36.0*  ?MCV 98.4 98.1  ?PLT 133* 156  ? ?Basic Metabolic Panel: ?Recent Labs  ?Lab 12/13/21 ?0214 12/13/21 ?1000 12/13/21 ?1212 12/14/21 ?2841  ?NA 127*  --  130* 131*  ?K 2.9*  --  3.7 3.6  ?CL 89*  --  88* 93*  ?CO2 28  --  30 29  ?GLUCOSE 127*  --  183* 140*  ?BUN 11  --  14 15  ?CREATININE 0.59*  --  0.79 0.50*  ?CALCIUM 8.2*  --  8.7* 8.6*  ?MG  --  1.5*  --   --   ? ?GFR: ?Estimated Creatinine Clearance: 76.3 mL/min (A) (by C-G formula based on SCr of 0.5 mg/dL (L)). ?Liver Function Tests: ?Recent Labs  ?Lab 12/14/21 ?3244  ?AST 108*  ?ALT 45*  ?ALKPHOS 108  ?BILITOT 2.2*  ?PROT 6.0*  ?ALBUMIN 2.7*  ? ?No results for input(s): LIPASE, AMYLASE in the last 168 hours. ?No results for input(s): AMMONIA in the last 168 hours. ?Coagulation Profile: ?No results for input(s): INR, PROTIME in the last 168 hours. ?Cardiac Enzymes: ?No results for input(s): CKTOTAL, CKMB, CKMBINDEX, TROPONINI in the last 168 hours. ?BNP (last 3 results) ?No results for input(s): PROBNP in the last 8760 hours. ?HbA1C: ?Recent Labs  ?  12/13/21 ?0824  ?HGBA1C 5.2  ? ?CBG: ?Recent Labs  ?Lab 12/13/21 ?1133 12/13/21 ?1533 12/13/21 ?2205 12/14/21 ?0735  ?GLUCAP 152* 156* 173* 150*  ? ?Lipid Profile: ?No results for input(s): CHOL, HDL, LDLCALC, TRIG, CHOLHDL, LDLDIRECT in the last 72 hours. ?Thyroid Function Tests: ?No results for input(s): TSH, T4TOTAL, FREET4, T3FREE, THYROIDAB in the last 72 hours. ?Anemia Panel: ?No results for input(s): VITAMINB12, FOLATE, FERRITIN, TIBC, IRON, RETICCTPCT in the last 72 hours. ?Sepsis Labs: ?No results for input(s): PROCALCITON, LATICACIDVEN in the  last 168 hours. ? ?Recent Results (from the past 240 hour(s))  ?Group A Strep by PCR     Status: None  ? Collection Time: 12/13/21  1:49 AM  ? Specimen: Throat; Sterile Swab  ?Result Value Ref Range Status  ? Group A Strep by PCR NOT DETECTED NOT DETECTED Final  ?  Comment: Performed at [facility], [address] [place], [place] [address]  ?Resp Panel by RT-PCR (Flu A&B, Covid) Nasopharyngeal Swab     Status: None  ? Collection Time: 12/13/21  8:59 AM  ? Specimen: Nasopharyngeal Swab; Nasopharyngeal(NP) swabs in vial transport medium  ?Result Value Ref Range Status  ? SARS Coronavirus 2 by RT PCR NEGATIVE NEGATIVE Final  ?  Comment: (NOTE) ?SARS-CoV-2 target nucleic acids are NOT DETECTED. ? ?The SARS-CoV-2 RNA is generally detectable in upper respiratory ?specimens during the acute phase of infection. The lowest ?concentration of SARS-CoV-2 viral  copies this assay can detect is ?138 copies/mL. A negative result does not preclude SARS-Cov-2 ?infection and should not be used as the sole basis for treatment or ?other patient management decisions. A negative result may occur with  ?improper specimen collection/handling, submission of specimen other ?than nasopharyngeal swab, presence of viral mutation(s) within the ?areas targeted by this assay, and inadequate number of viral ?copies(<138 copies/mL). A negative result must be combined with ?clinical observations, patient history, and epidemiological ?information. The expected result is Negative. ? ?Fact Sheet for Patients:  ?EntrepreneurPulse.com.au ? ?Fact Sheet for Healthcare Providers:  ?IncredibleEmployment.be ? ?This test is no t yet approved or cleared by the [place] FDA and  ?has been authorized for detection and/or diagnosis of SARS-CoV-2 by ?FDA under an Emergency Use Authorization (EUA). This EUA will remain  ?in effect (meaning this test can be used) for the duration of the ?COVID-19 declaration under Section  564(b)(1) of the Act, 21 ?U.S.C.section 360bbb-3(b)(1), unless the authorization is terminated  ?or revoked sooner.  ? ? ?  ? Influenza A by PCR NEGATIVE NEGATIVE Final  ? Influenza B by PCR NEGATIVE NEGATIVE Fi

## 2021-12-14 NOTE — Progress Notes (Signed)
Patient stated he would wear BiPAP so he could breath better to sleep. BiPAP provided. ?

## 2021-12-15 LAB — HEPATIC FUNCTION PANEL
ALT: 38 U/L (ref 0–44)
AST: 79 U/L — ABNORMAL HIGH (ref 15–41)
Albumin: 2.6 g/dL — ABNORMAL LOW (ref 3.5–5.0)
Alkaline Phosphatase: 107 U/L (ref 38–126)
Bilirubin, Direct: 0.7 mg/dL — ABNORMAL HIGH (ref 0.0–0.2)
Indirect Bilirubin: 0.6 mg/dL (ref 0.3–0.9)
Total Bilirubin: 1.3 mg/dL — ABNORMAL HIGH (ref 0.3–1.2)
Total Protein: 5.7 g/dL — ABNORMAL LOW (ref 6.5–8.1)

## 2021-12-15 LAB — MAGNESIUM: Magnesium: 1.5 mg/dL — ABNORMAL LOW (ref 1.7–2.4)

## 2021-12-15 LAB — BASIC METABOLIC PANEL
Anion gap: 10 (ref 5–15)
BUN: 20 mg/dL (ref 6–20)
CO2: 27 mmol/L (ref 22–32)
Calcium: 8.5 mg/dL — ABNORMAL LOW (ref 8.9–10.3)
Chloride: 91 mmol/L — ABNORMAL LOW (ref 98–111)
Creatinine, Ser: 0.75 mg/dL (ref 0.61–1.24)
GFR, Estimated: >60 mL/min (ref >60)
Glucose, Bld: 166 mg/dL — ABNORMAL HIGH (ref 70–99)
Potassium: 3.5 mmol/L (ref 3.5–5.1)
Sodium: 128 mmol/L — ABNORMAL LOW (ref 135–145)

## 2021-12-15 LAB — CBC
HCT: 34.2 % — ABNORMAL LOW (ref 39.0–52.0)
Hemoglobin: 11.5 g/dL — ABNORMAL LOW (ref 13.0–17.0)
MCH: 33.1 pg (ref 26.0–34.0)
MCHC: 33.6 g/dL (ref 30.0–36.0)
MCV: 98.6 fL (ref 80.0–100.0)
Platelets: 157 10*3/uL (ref 150–400)
RBC: 3.47 MIL/uL — ABNORMAL LOW (ref 4.22–5.81)
RDW: 17.3 % — ABNORMAL HIGH (ref 11.5–15.5)
WBC: 11 10*3/uL — ABNORMAL HIGH (ref 4.0–10.5)
nRBC: 0 % (ref 0.0–0.2)

## 2021-12-15 LAB — GLUCOSE, CAPILLARY: Glucose-Capillary: 139 mg/dL — ABNORMAL HIGH (ref 70–99)

## 2021-12-15 MED ORDER — POTASSIUM CHLORIDE 10 MEQ/100ML IV SOLN
10.0000 meq | INTRAVENOUS | Status: DC
  Administered 2021-12-15: 10 meq via INTRAVENOUS
  Filled 2021-12-15: qty 100

## 2021-12-15 MED ORDER — MAGNESIUM SULFATE 2 GM/50ML IV SOLN
2.0000 g | Freq: Once | INTRAVENOUS | Status: AC
  Administered 2021-12-15: 2 g via INTRAVENOUS
  Filled 2021-12-15: qty 50

## 2021-12-15 MED ORDER — SODIUM CHLORIDE 0.9 % IV SOLN: INTRAVENOUS | Status: DC | PRN

## 2021-12-15 NOTE — Discharge Summary (Signed)
?Physician Discharge Summary ?  ?Patient: Francisco Robinson MRN: [id number] DOB: 06-Sep-1960  ?Admit date:     12/13/2021  ?Discharge date: 12/15/21  ?Discharge Physician: Barton Dubois  ? ?PCP: Carrolyn Meiers, MD  ? ?Recommendations at discharge:  ?Patient left AGAINST MEDICAL ADVICE. ? ?Discharge Diagnoses: ?Principal Problem: ?  Acute on chronic combined systolic and diastolic CHF (congestive heart failure) ([place]) ?Active Problems: ?  COPD exacerbation ([place]) ?  Acute respiratory failure with hypoxia ([place]) ?  Hyponatremia ?  History of ETOH abuse ?  Noncompliance with medication regimen ?  Tobacco abuse ?  Mixed hyperlipidemia ?  Elevated troponin ?  Elevated brain natriuretic peptide (BNP) level ?  Essential hypertension ? ?Hospital Course: ?Francisco Robinson is a 61 y.o. male with medical history significant of systolic CHF, COPD, type II DM, tobacco abuse, alcohol abuse who presented to the emergency department due to sore throat and shortness of breath. He complained of 1 week onset of worsening shortness of breath and bilateral leg swelling and also complaining of sore throat.   ?  ?He has had numerous hospitalizations in 2023: ?-April 17 to April 19 admitted for CHF exacerbation, left AMA ?-March 21 to March 23 admitted for CHF exacerbation, left AMA ?-February 26 to February 28 admitted for CHF exacerbation and COPD exacerbation, discharged on oral Lasix and prednisone ?-January 8 to January 10 admitted for CHF exacerbation and COPD exacerbation, left AMA.  Echocardiogram revealed severe hypokinesis, EF 30%. ?  ?In the emergency department during this hospital stay, patient was found to have BNP >4951.  Patient required BiPAP due to work of breathing and hypoxia.  Patient was given IV Lasix and IV Solu-Medrol and admitted for CHF exacerbation, COPD exacerbation. ? ?Assessment and Plan: ?  ?Acute hypoxemic respiratory failure secondary to acute on chronic combined systolic and diastolic heart failure, COPD  exacerbation ?-Echocardiogram revealed severe hypokinesis, EF 30% in January 2023. Has been medically noncompliant with repeat hospitalizations ?-Briefly required BiPAP, now weaned to 2-3 L ?-Continue IV Lasix, coreg ?-Strict I's and O's, daily weight, fluid restriction ?-Patient feels significantly improved and decided to leave the hospital [place]. ?  ?COPD exacerbation ?-Patient was still actively receiving antibiotics, steroids and bronchodilator management. ?-2-3 L nasal cannula supplementation in place. ? ?Alcohol dependence and withdrawal ?-Continue CIWA protocol ?-No major withdrawal symptoms appreciated. ?  ?Transaminitis ?-Likely due to congestive hepatopathy in setting of CHF ?-Patient left AGAINST MEDICAL ADVICE; LFTs were trending down. ?  ?Diabetes mellitus ?-Actively receiving a sliding scale insulin while inpatient. ?  ?CAD ?-Aspirin Lipitor Imdur ?-No chest pain. ? ? ?Consultants: None ?Procedures performed: See below for x-ray reports. ?Disposition: Patient left AGAINST MEDICAL ADVICE. ?Diet recommendation: While hospitalized discussion about modified carbohydrate and low-sodium provided.  Patient left AGAINST MEDICAL ADVICE. ? ?DISCHARGE MEDICATION: ?Patient left AGAINST MEDICAL ADVICE. ? ? ?Discharge Exam: ?Filed Weights  ? 12/13/21 0147 12/14/21 0500 12/15/21 0447  ?Weight: 54.9 kg 54.9 kg 58.9 kg  ? ?Patient left AGAINST MEDICAL ADVICE; as per nurse at bedside no complaints of chest pain unable to speak in almost complete sentences.  He declined to sign AGAINST MEDICAL ADVICE papers got himself dressed up rip off IV line and left the hospital. ? ?Condition at discharge: Patient left AGAINST MEDICAL ADVICE. ? ?The results of significant diagnostics from this hospitalization (including imaging, microbiology, ancillary and laboratory) are listed below for reference.  ? ?Imaging Studies: ?DG Chest Portable 1 View ? ?Result Date: 12/13/2021 ?CLINICAL DATA:  Sore  throat EXAM: PORTABLE  CHEST 1 VIEW COMPARISON:  11/24/2021 FINDINGS: The lungs are hyperinflated with diffuse interstitial prominence. No focal airspace consolidation or pulmonary edema. No pleural effusion or pneumothorax. Normal cardiomediastinal contours. IMPRESSION: COPD without acute airspace disease. Electronically Signed   By: Ulyses Jarred M.D.   On: 12/13/2021 02:49  ? ?DG Chest Portable 1 View ? ?Result Date: 11/24/2021 ?CLINICAL DATA:  Wheezing and dyspnea. Shortness of breath with nasal congestion. EXAM: PORTABLE CHEST 1 VIEW COMPARISON:  10/28/2021 FINDINGS: Stable cardiac enlargement. Aortic atherosclerosis. Moderate right pleural effusion with overlying subsegmental atelectasis noted. Mild diffuse interstitial edema. No airspace opacities. IMPRESSION: 1. Right pleural effusion and mild interstitial edema compatible with CHF. Electronically Signed   By: Kerby Moors M.D.   On: 11/24/2021 05:51   ? ?Microbiology: ?Results for orders placed or performed during the hospital encounter of 12/13/21  ?Group A Strep by PCR     Status: None  ? Collection Time: 12/13/21  1:49 AM  ? Specimen: Throat; Sterile Swab  ?Result Value Ref Range Status  ? Group A Strep by PCR NOT DETECTED NOT DETECTED Final  ?  Comment: Performed at [facility], [address] [place],  [address]  ?Resp Panel by RT-PCR (Flu A&B, Covid) Nasopharyngeal Swab     Status: None  ? Collection Time: 12/13/21  8:59 AM  ? Specimen: Nasopharyngeal Swab; Nasopharyngeal(NP) swabs in vial transport medium  ?Result Value Ref Range Status  ? SARS Coronavirus 2 by RT PCR NEGATIVE NEGATIVE Final  ?  Comment: (NOTE) ?SARS-CoV-2 target nucleic acids are NOT DETECTED. ? ?The SARS-CoV-2 RNA is generally detectable in upper respiratory ?specimens during the acute phase of infection. The lowest ?concentration of SARS-CoV-2 viral copies this assay can detect is ?138 copies/mL. A negative result does not preclude SARS-Cov-2 ?infection and should not be used as the sole basis  for treatment or ?other patient management decisions. A negative result may occur with  ?improper specimen collection/handling, submission of specimen other ?than nasopharyngeal swab, presence of viral mutation(s) within the ?areas targeted by this assay, and inadequate number of viral ?copies(<138 copies/mL). A negative result must be combined with ?clinical observations, patient history, and epidemiological ?information. The expected result is Negative. ? ?Fact Sheet for Patients:  ?EntrepreneurPulse.com.au ? ?Fact Sheet for Healthcare Providers:  ?IncredibleEmployment.be ? ?This test is no t yet approved or cleared by the [place] FDA and  ?has been authorized for detection and/or diagnosis of SARS-CoV-2 by ?FDA under an Emergency Use Authorization (EUA). This EUA will remain  ?in effect (meaning this test can be used) for the duration of the ?COVID-19 declaration under Section 564(b)(1) of the Act, 21 ?U.S.C.section 360bbb-3(b)(1), unless the authorization is terminated  ?or revoked sooner.  ? ? ?  ? Influenza A by PCR NEGATIVE NEGATIVE Final  ? Influenza B by PCR NEGATIVE NEGATIVE Final  ?  Comment: (NOTE) ?The Xpert Xpress SARS-CoV-2/FLU/RSV plus assay is intended as an aid ?in the diagnosis of influenza from Nasopharyngeal swab specimens and ?should not be used as a sole basis for treatment. Nasal washings and ?aspirates are unacceptable for Xpert Xpress SARS-CoV-2/FLU/RSV ?testing. ? ?Fact Sheet for Patients: ?EntrepreneurPulse.com.au ? ?Fact Sheet for Healthcare Providers: ?IncredibleEmployment.be ? ?This test is not yet approved or cleared by the [place] FDA and ?has been authorized for detection and/or diagnosis of SARS-CoV-2 by ?FDA under an Emergency Use Authorization (EUA). This EUA will remain ?in effect (meaning this test can be used) for the duration of the ?  COVID-19 declaration under Section 564(b)(1) of the Act, 21  U.S.C. ?section 360bbb-3(b)(1), unless the authorization is terminated or ?revoked. ? ?Performed at [facility], [address], [place], [place] [address] ?  ?MRSA Next Gen by PCR, Nasal     Status: Non

## 2021-12-15 NOTE — Progress Notes (Signed)
Patient's bedside monitor alarming.  Patient had pulled out his PIV and removed himself from the monitor.  Patient stated he is leaving, patient refused to sign leaving against medical advice paper, and MD [facility] notified.  ?

## 2021-12-18 ENCOUNTER — Inpatient Hospital Stay (HOSPITAL_COMMUNITY)
Admission: EM | Admit: 2021-12-18 | Discharge: 2021-12-22 | DRG: 640 | Disposition: A | Discharge status: Home / Self Care | Payer: Medicaid Other | Attending: Family Medicine | Admitting: Family Medicine

## 2021-12-18 ENCOUNTER — Other Ambulatory Visit: Payer: Self-pay

## 2021-12-18 ENCOUNTER — Encounter (HOSPITAL_COMMUNITY): Payer: Self-pay | Admitting: *Deleted

## 2021-12-18 ENCOUNTER — Emergency Department (HOSPITAL_COMMUNITY): Payer: Medicaid Other

## 2021-12-18 DIAGNOSIS — I429 Cardiomyopathy, unspecified: Secondary | ICD-10-CM

## 2021-12-18 DIAGNOSIS — F1010 Alcohol abuse, uncomplicated: Secondary | ICD-10-CM | POA: Diagnosis present

## 2021-12-18 DIAGNOSIS — E119 Type 2 diabetes mellitus without complications: Secondary | ICD-10-CM | POA: Diagnosis present

## 2021-12-18 DIAGNOSIS — K7030 Alcoholic cirrhosis of liver without ascites: Secondary | ICD-10-CM | POA: Diagnosis not present

## 2021-12-18 DIAGNOSIS — I5043 Acute on chronic combined systolic (congestive) and diastolic (congestive) heart failure: Secondary | ICD-10-CM | POA: Diagnosis present

## 2021-12-18 DIAGNOSIS — I252 Old myocardial infarction: Secondary | ICD-10-CM

## 2021-12-18 DIAGNOSIS — Z91128 Patient's intentional underdosing of medication regimen for other reason: Secondary | ICD-10-CM | POA: Diagnosis not present

## 2021-12-18 DIAGNOSIS — Y92009 Unspecified place in unspecified non-institutional (private) residence as the place of occurrence of the external cause: Secondary | ICD-10-CM | POA: Diagnosis not present

## 2021-12-18 DIAGNOSIS — R188 Other ascites: Secondary | ICD-10-CM | POA: Diagnosis present

## 2021-12-18 DIAGNOSIS — Z7951 Long term (current) use of inhaled steroids: Secondary | ICD-10-CM

## 2021-12-18 DIAGNOSIS — G8929 Other chronic pain: Secondary | ICD-10-CM | POA: Diagnosis present

## 2021-12-18 DIAGNOSIS — R7401 Elevation of levels of liver transaminase levels: Secondary | ICD-10-CM | POA: Diagnosis present

## 2021-12-18 DIAGNOSIS — J441 Chronic obstructive pulmonary disease with (acute) exacerbation: Secondary | ICD-10-CM | POA: Diagnosis present

## 2021-12-18 DIAGNOSIS — E871 Hypo-osmolality and hyponatremia: Secondary | ICD-10-CM | POA: Diagnosis present

## 2021-12-18 DIAGNOSIS — I110 Hypertensive heart disease with heart failure: Secondary | ICD-10-CM | POA: Diagnosis present

## 2021-12-18 DIAGNOSIS — I69311 Memory deficit following cerebral infarction: Secondary | ICD-10-CM

## 2021-12-18 DIAGNOSIS — T463X6A Underdosing of coronary vasodilators, initial encounter: Secondary | ICD-10-CM | POA: Diagnosis present

## 2021-12-18 DIAGNOSIS — Z79899 Other long term (current) drug therapy: Secondary | ICD-10-CM | POA: Diagnosis not present

## 2021-12-18 DIAGNOSIS — J9601 Acute respiratory failure with hypoxia: Secondary | ICD-10-CM | POA: Diagnosis present

## 2021-12-18 DIAGNOSIS — I2510 Atherosclerotic heart disease of native coronary artery without angina pectoris: Secondary | ICD-10-CM | POA: Diagnosis present

## 2021-12-18 DIAGNOSIS — I426 Alcoholic cardiomyopathy: Secondary | ICD-10-CM | POA: Diagnosis not present

## 2021-12-18 DIAGNOSIS — Z720 Tobacco use: Secondary | ICD-10-CM | POA: Diagnosis present

## 2021-12-18 DIAGNOSIS — B1920 Unspecified viral hepatitis C without hepatic coma: Secondary | ICD-10-CM | POA: Diagnosis present

## 2021-12-18 DIAGNOSIS — T447X6A Underdosing of beta-adrenoreceptor antagonists, initial encounter: Secondary | ICD-10-CM | POA: Diagnosis present

## 2021-12-18 DIAGNOSIS — K7460 Unspecified cirrhosis of liver: Secondary | ICD-10-CM | POA: Diagnosis present

## 2021-12-18 DIAGNOSIS — F17210 Nicotine dependence, cigarettes, uncomplicated: Secondary | ICD-10-CM | POA: Diagnosis present

## 2021-12-18 DIAGNOSIS — I10 Essential (primary) hypertension: Secondary | ICD-10-CM | POA: Diagnosis present

## 2021-12-18 DIAGNOSIS — Z86010 Personal history of colonic polyps: Secondary | ICD-10-CM

## 2021-12-18 DIAGNOSIS — F419 Anxiety disorder, unspecified: Secondary | ICD-10-CM | POA: Diagnosis present

## 2021-12-18 DIAGNOSIS — T465X6A Underdosing of other antihypertensive drugs, initial encounter: Secondary | ICD-10-CM | POA: Diagnosis present

## 2021-12-18 LAB — CBC WITH DIFFERENTIAL/PLATELET
Abs Immature Granulocytes: 0.05 10*3/uL (ref 0.00–0.07)
Basophils Absolute: 0 10*3/uL (ref 0.0–0.1)
Basophils Relative: 0 %
Eosinophils Absolute: 0.1 10*3/uL (ref 0.0–0.5)
Eosinophils Relative: 1 %
HCT: 35.7 % — ABNORMAL LOW (ref 39.0–52.0)
Hemoglobin: 12.5 g/dL — ABNORMAL LOW (ref 13.0–17.0)
Immature Granulocytes: 1 %
Lymphocytes Relative: 23 %
Lymphs Abs: 2 10*3/uL (ref 0.7–4.0)
MCH: 33.2 pg (ref 26.0–34.0)
MCHC: 35 g/dL (ref 30.0–36.0)
MCV: 94.7 fL (ref 80.0–100.0)
Monocytes Absolute: 0.8 10*3/uL (ref 0.1–1.0)
Monocytes Relative: 9 %
Neutro Abs: 6 10*3/uL (ref 1.7–7.7)
Neutrophils Relative %: 66 %
Platelets: 149 10*3/uL — ABNORMAL LOW (ref 150–400)
RBC: 3.77 MIL/uL — ABNORMAL LOW (ref 4.22–5.81)
RDW: 16.4 % — ABNORMAL HIGH (ref 11.5–15.5)
WBC: 8.9 10*3/uL (ref 4.0–10.5)
nRBC: 0 % (ref 0.0–0.2)

## 2021-12-18 LAB — BASIC METABOLIC PANEL
Anion gap: 12 (ref 5–15)
BUN: 14 mg/dL (ref 6–20)
CO2: 20 mmol/L — ABNORMAL LOW (ref 22–32)
Calcium: 7.7 mg/dL — ABNORMAL LOW (ref 8.9–10.3)
Chloride: 84 mmol/L — ABNORMAL LOW (ref 98–111)
Creatinine, Ser: 0.75 mg/dL (ref 0.61–1.24)
GFR, Estimated: >60 mL/min (ref >60)
Glucose, Bld: 96 mg/dL (ref 70–99)
Potassium: 4.2 mmol/L (ref 3.5–5.1)
Sodium: 116 mmol/L — CL (ref 135–145)

## 2021-12-18 LAB — CBG MONITORING, ED: Glucose-Capillary: 160 mg/dL — ABNORMAL HIGH (ref 70–99)

## 2021-12-18 LAB — HEPATIC FUNCTION PANEL
ALT: 72 U/L — ABNORMAL HIGH (ref 0–44)
AST: 216 U/L — ABNORMAL HIGH (ref 15–41)
Albumin: 3 g/dL — ABNORMAL LOW (ref 3.5–5.0)
Alkaline Phosphatase: 136 U/L — ABNORMAL HIGH (ref 38–126)
Bilirubin, Direct: 0.9 mg/dL — ABNORMAL HIGH (ref 0.0–0.2)
Indirect Bilirubin: 1.1 mg/dL — ABNORMAL HIGH (ref 0.3–0.9)
Total Bilirubin: 2 mg/dL — ABNORMAL HIGH (ref 0.3–1.2)
Total Protein: 6.3 g/dL — ABNORMAL LOW (ref 6.5–8.1)

## 2021-12-18 LAB — BRAIN NATRIURETIC PEPTIDE: B Natriuretic Peptide: 2680 pg/mL — ABNORMAL HIGH (ref 0.0–100.0)

## 2021-12-18 MED ORDER — SODIUM CHLORIDE 0.9 % IV BOLUS
500.0000 mL | Freq: Once | INTRAVENOUS | Status: AC
Start: 2021-12-18 — End: 2021-12-19
  Administered 2021-12-18: 500 mL via INTRAVENOUS

## 2021-12-18 MED ORDER — IPRATROPIUM-ALBUTEROL 0.5-2.5 (3) MG/3ML IN SOLN
3.0000 mL | Freq: Four times a day (QID) | RESPIRATORY_TRACT | Status: DC
  Administered 2021-12-19 – 2021-12-22 (×9): 3 mL via RESPIRATORY_TRACT
  Filled 2021-12-18 (×9): qty 3

## 2021-12-18 MED ORDER — IPRATROPIUM-ALBUTEROL 0.5-2.5 (3) MG/3ML IN SOLN
3.0000 mL | Freq: Four times a day (QID) | RESPIRATORY_TRACT | Status: DC
  Administered 2021-12-18: 3 mL via RESPIRATORY_TRACT
  Filled 2021-12-18: qty 3

## 2021-12-18 MED ORDER — ASPIRIN EC 81 MG PO TBEC
81.0000 mg | DELAYED_RELEASE_TABLET | Freq: Every day | ORAL | Status: DC
  Administered 2021-12-19 – 2021-12-22 (×4): 81 mg via ORAL
  Filled 2021-12-18 (×4): qty 1

## 2021-12-18 MED ORDER — THIAMINE HCL 100 MG/ML IJ SOLN
500.0000 mg | Freq: Once | INTRAVENOUS | Status: AC
  Administered 2021-12-18: 500 mg via INTRAVENOUS
  Filled 2021-12-18: qty 5

## 2021-12-18 MED ORDER — PREDNISONE 20 MG PO TABS
40.0000 mg | ORAL_TABLET | Freq: Every day | ORAL | Status: DC
  Administered 2021-12-20 – 2021-12-22 (×3): 40 mg via ORAL
  Filled 2021-12-18 (×3): qty 2

## 2021-12-18 MED ORDER — ALBUTEROL SULFATE (2.5 MG/3ML) 0.083% IN NEBU
2.5000 mg | INHALATION_SOLUTION | Freq: Once | RESPIRATORY_TRACT | Status: AC
  Administered 2021-12-18: 2.5 mg via RESPIRATORY_TRACT
  Filled 2021-12-18: qty 3

## 2021-12-18 MED ORDER — FUROSEMIDE 10 MG/ML IJ SOLN
20.0000 mg | Freq: Two times a day (BID) | INTRAMUSCULAR | Status: DC
  Administered 2021-12-19 – 2021-12-20 (×4): 20 mg via INTRAVENOUS
  Filled 2021-12-18 (×4): qty 2

## 2021-12-18 MED ORDER — ATORVASTATIN CALCIUM 40 MG PO TABS
40.0000 mg | ORAL_TABLET | Freq: Every day | ORAL | Status: DC
  Administered 2021-12-19 – 2021-12-22 (×4): 40 mg via ORAL
  Filled 2021-12-18 (×4): qty 1

## 2021-12-18 MED ORDER — ADULT MULTIVITAMIN W/MINERALS CH: 1.0000 | ORAL_TABLET | Freq: Every day | ORAL | Status: DC

## 2021-12-18 MED ORDER — LORAZEPAM 1 MG PO TABS
1.0000 mg | ORAL_TABLET | ORAL | Status: AC | PRN
  Administered 2021-12-18: 1 mg via ORAL
  Administered 2021-12-19 – 2021-12-21 (×6): 2 mg via ORAL
  Filled 2021-12-18 (×3): qty 2
  Filled 2021-12-18: qty 1
  Filled 2021-12-18 (×3): qty 2

## 2021-12-18 MED ORDER — ONDANSETRON HCL 4 MG/2ML IJ SOLN
4.0000 mg | Freq: Four times a day (QID) | INTRAMUSCULAR | Status: DC | PRN
Start: 2021-12-18 — End: 2021-12-22
  Filled 2021-12-18: qty 2

## 2021-12-18 MED ORDER — METHYLPREDNISOLONE SODIUM SUCC 125 MG IJ SOLR
125.0000 mg | Freq: Once | INTRAMUSCULAR | Status: AC
  Administered 2021-12-18: 125 mg via INTRAVENOUS
  Filled 2021-12-18: qty 2

## 2021-12-18 MED ORDER — MAGNESIUM SULFATE 2 GM/50ML IV SOLN
2.0000 g | Freq: Once | INTRAVENOUS | Status: AC
Start: 2021-12-18 — End: 2021-12-18
  Administered 2021-12-18: 2 g via INTRAVENOUS
  Filled 2021-12-18: qty 50

## 2021-12-18 MED ORDER — THIAMINE HCL 100 MG/ML IJ SOLN: 100.0000 mg | Freq: Every day | INTRAMUSCULAR | Status: DC

## 2021-12-18 MED ORDER — IPRATROPIUM-ALBUTEROL 0.5-2.5 (3) MG/3ML IN SOLN
3.0000 mL | Freq: Once | RESPIRATORY_TRACT | Status: AC
  Administered 2021-12-18: 3 mL via RESPIRATORY_TRACT
  Filled 2021-12-18: qty 3

## 2021-12-18 MED ORDER — NICOTINE 21 MG/24HR TD PT24
21.0000 mg | MEDICATED_PATCH | Freq: Every day | TRANSDERMAL | Status: DC
  Administered 2021-12-20 – 2021-12-22 (×3): 21 mg via TRANSDERMAL
  Filled 2021-12-18 (×4): qty 1

## 2021-12-18 MED ORDER — ONDANSETRON HCL 4 MG PO TABS: 4.0000 mg | ORAL_TABLET | Freq: Four times a day (QID) | ORAL | Status: DC | PRN

## 2021-12-18 MED ORDER — THIAMINE HCL 100 MG PO TABS: 100.0000 mg | ORAL_TABLET | Freq: Every day | ORAL | Status: DC

## 2021-12-18 MED ORDER — METHYLPREDNISOLONE SODIUM SUCC 40 MG IJ SOLR
40.0000 mg | Freq: Two times a day (BID) | INTRAMUSCULAR | Status: AC
  Administered 2021-12-19 (×2): 40 mg via INTRAVENOUS
  Filled 2021-12-18 (×2): qty 1

## 2021-12-18 MED ORDER — HEPARIN SODIUM (PORCINE) 5000 UNIT/ML IJ SOLN
5000.0000 [IU] | Freq: Three times a day (TID) | INTRAMUSCULAR | Status: DC
  Administered 2021-12-18 – 2021-12-19 (×2): 5000 [IU] via SUBCUTANEOUS
  Filled 2021-12-18 (×3): qty 1

## 2021-12-18 MED ORDER — LORAZEPAM 2 MG/ML IJ SOLN
1.0000 mg | INTRAMUSCULAR | Status: AC | PRN
  Administered 2021-12-19 (×3): 2 mg via INTRAVENOUS
  Administered 2021-12-20: 1 mg via INTRAVENOUS
  Administered 2021-12-21 (×2): 2 mg via INTRAVENOUS
  Filled 2021-12-18 (×6): qty 1

## 2021-12-18 MED ORDER — INSULIN ASPART 100 UNIT/ML IJ SOLN
0.0000 [IU] | Freq: Every day | INTRAMUSCULAR | Status: DC
  Administered 2021-12-19: 2 [IU] via SUBCUTANEOUS

## 2021-12-18 MED ORDER — HYDRALAZINE HCL 25 MG PO TABS
25.0000 mg | ORAL_TABLET | Freq: Three times a day (TID) | ORAL | Status: DC
  Administered 2021-12-18 – 2021-12-20 (×6): 25 mg via ORAL
  Filled 2021-12-18 (×7): qty 1

## 2021-12-18 MED ORDER — THIAMINE HCL 100 MG PO TABS
100.0000 mg | ORAL_TABLET | Freq: Every day | ORAL | Status: DC
  Administered 2021-12-18 – 2021-12-22 (×5): 100 mg via ORAL
  Filled 2021-12-18 (×5): qty 1

## 2021-12-18 MED ORDER — INSULIN ASPART 100 UNIT/ML IJ SOLN
0.0000 [IU] | Freq: Three times a day (TID) | INTRAMUSCULAR | Status: DC
  Administered 2021-12-19: 1 [IU] via SUBCUTANEOUS
  Administered 2021-12-19: 3 [IU] via SUBCUTANEOUS
  Administered 2021-12-19: 2 [IU] via SUBCUTANEOUS
  Administered 2021-12-20: 5 [IU] via SUBCUTANEOUS
  Administered 2021-12-20: 2 [IU] via SUBCUTANEOUS
  Administered 2021-12-20: 1 [IU] via SUBCUTANEOUS
  Administered 2021-12-21 (×2): 2 [IU] via SUBCUTANEOUS
  Administered 2021-12-21: 3 [IU] via SUBCUTANEOUS
  Administered 2021-12-22: 2 [IU] via SUBCUTANEOUS

## 2021-12-18 MED ORDER — FOLIC ACID 1 MG PO TABS: 1.0000 mg | ORAL_TABLET | Freq: Every day | ORAL | Status: DC

## 2021-12-18 MED ORDER — MOMETASONE FURO-FORMOTEROL FUM 200-5 MCG/ACT IN AERO
2.0000 | INHALATION_SPRAY | Freq: Two times a day (BID) | RESPIRATORY_TRACT | Status: DC
  Administered 2021-12-18 – 2021-12-22 (×8): 2 via RESPIRATORY_TRACT
  Filled 2021-12-18: qty 8.8

## 2021-12-18 MED ORDER — FOLIC ACID 1 MG PO TABS
1.0000 mg | ORAL_TABLET | Freq: Every day | ORAL | Status: DC
  Administered 2021-12-18 – 2021-12-22 (×5): 1 mg via ORAL
  Filled 2021-12-18 (×5): qty 1

## 2021-12-18 MED ORDER — THIAMINE HCL 100 MG/ML IJ SOLN
INTRAMUSCULAR | Status: AC
  Filled 2021-12-18: qty 6

## 2021-12-18 MED ORDER — ADULT MULTIVITAMIN W/MINERALS CH
1.0000 | ORAL_TABLET | Freq: Every day | ORAL | Status: DC
  Administered 2021-12-18 – 2021-12-22 (×5): 1 via ORAL
  Filled 2021-12-18 (×5): qty 1

## 2021-12-18 MED ORDER — SODIUM CHLORIDE 0.9 % IV BOLUS: 1000.0000 mL | Freq: Once | INTRAVENOUS | Status: DC

## 2021-12-18 MED ORDER — SODIUM CHLORIDE 0.9 % IV SOLN
1.0000 g | INTRAVENOUS | Status: DC
  Administered 2021-12-18 – 2021-12-21 (×4): 1 g via INTRAVENOUS
  Filled 2021-12-18 (×4): qty 10

## 2021-12-18 MED ORDER — ALBUTEROL SULFATE (2.5 MG/3ML) 0.083% IN NEBU
2.5000 mg | INHALATION_SOLUTION | RESPIRATORY_TRACT | Status: DC | PRN
  Administered 2021-12-21: 2.5 mg via RESPIRATORY_TRACT
  Filled 2021-12-18: qty 3

## 2021-12-18 NOTE — ED Provider Notes (Signed)
?[facility] ?Provider Note ? ? ?CSN: [id number] ?Arrival date & time: 12/18/21  1821 ? ?  ? ?History ? ?Chief Complaint  ?Patient presents with  ? Shortness of Breath  ? ? ?Francisco Robinson is a 61 y.o. male. ? ?Patient complains of shortness of breath and weakness.  Patient has a history of cirrhosis and alcohol abuse.  He also has COPD and heart failure ? ? ?Shortness of Breath ? ?  ? ?Home Medications ?Prior to Admission medications   ?Medication Sig Start Date End Date Taking? Authorizing Provider  ?albuterol (VENTOLIN HFA) 108 (90 Base) MCG/ACT inhaler Inhale 2 puffs into the lungs every 6 (six) hours as needed for wheezing or shortness of breath. 10/07/21  Yes Emokpae, Courage, MD  ?dextromethorphan-guaiFENesin (MUCINEX DM) 30-600 MG 12hr tablet Take 1 tablet by mouth 2 (two) times daily. 10/07/21  Yes Emokpae, Courage, MD  ?furosemide (LASIX) 20 MG tablet Take 1 tablet (20 mg total) by mouth daily. 10/07/21  Yes Roxan Hockey, MD  ?isosorbide mononitrate (IMDUR) 30 MG 24 hr tablet Take 0.5 tablets (15 mg total) by mouth daily. 10/08/21  Yes Roxan Hockey, MD  ?Dellis Anes 160-4.5 MCG/ACT inhaler Inhale 1 puff into the lungs 2 (two) times daily. 10/07/21  Yes Roxan Hockey, MD  ?aspirin EC 81 MG EC tablet Take 1 tablet (81 mg total) by mouth daily with breakfast. Swallow whole. ?Patient not taking: Reported on 10/28/2021 10/08/21   Roxan Hockey, MD  ?atorvastatin (LIPITOR) 40 MG tablet Take 1 tablet (40 mg total) by mouth daily. ?Patient not taking: Reported on 12/18/2021 10/07/21 11/06/21  Roxan Hockey, MD  ?carvedilol (COREG) 3.125 MG tablet Take 1 tablet (3.125 mg total) by mouth 2 (two) times daily with a meal. ?Patient not taking: Reported on 12/18/2021 10/07/21   Roxan Hockey, MD  ?folic acid (FOLVITE) 1 MG tablet Take 1 tablet (1 mg total) by mouth daily. ?Patient not taking: Reported on 12/18/2021 10/07/21   Roxan Hockey, MD  ?hydrALAZINE (APRESOLINE) 25 MG tablet Take 1  tablet (25 mg total) by mouth every 8 (eight) hours. ?Patient not taking: Reported on 12/18/2021 10/07/21   Roxan Hockey, MD  ?hydrocortisone (ANUSOL-HC) 25 MG suppository Place 1 suppository (25 mg total) rectally 2 (two) times daily. ?Patient not taking: Reported on 12/18/2021 10/07/21   Roxan Hockey, MD  ?losartan (COZAAR) 25 MG tablet Take 1 tablet (25 mg total) by mouth daily. ?Patient not taking: Reported on 12/18/2021 10/08/21   Roxan Hockey, MD  ?Multiple Vitamin (MULTIVITAMIN WITH MINERALS) TABS tablet Take 1 tablet by mouth daily. ?Patient not taking: Reported on 12/18/2021 10/08/21   Roxan Hockey, MD  ?nicotine (NICODERM CQ - DOSED IN MG/24 HOURS) 21 mg/24hr patch Place 1 patch (21 mg total) onto the skin daily. ?Patient not taking: Reported on 12/18/2021 10/07/21   Roxan Hockey, MD  ?thiamine 100 MG tablet Take 1 tablet (100 mg total) by mouth daily. ?Patient not taking: Reported on 12/18/2021 10/07/21   Roxan Hockey, MD  ?   ? ?Allergies    ?Patient has no known allergies.   ? ?Review of Systems   ?Review of Systems  ?Respiratory:  Positive for shortness of breath.   ? ?Physical Exam ?Updated Vital Signs ?BP (!) 120/92   Pulse (!) 110   Temp 97.6 ?F (36.4 ?C) (Axillary)   Resp 16   Ht '5\' 7"'$  (1.702 m)   Wt 58.9 kg   SpO2 99%   BMI 20.34 kg/m?  ?Physical Exam ? ?ED Results /  Procedures / Treatments   ?Labs ?(all labs ordered are listed, but only abnormal results are displayed) ?Labs Reviewed  ?BASIC METABOLIC PANEL - Abnormal; Notable for the following components:  ?    Result Value  ? Sodium 116 (*)   ? Chloride 84 (*)   ? CO2 20 (*)   ? Calcium 7.7 (*)   ? All other components within normal limits  ?BRAIN NATRIURETIC PEPTIDE - Abnormal; Notable for the following components:  ? B Natriuretic Peptide 2,680.0 (*)   ? All other components within normal limits  ?CBC WITH DIFFERENTIAL/PLATELET - Abnormal; Notable for the following components:  ? RBC 3.77 (*)   ? Hemoglobin 12.5 (*)   ? HCT  35.7 (*)   ? RDW 16.4 (*)   ? Platelets 149 (*)   ? All other components within normal limits  ?HEPATIC FUNCTION PANEL - Abnormal; Notable for the following components:  ? Total Protein 6.3 (*)   ? Albumin 3.0 (*)   ? AST 216 (*)   ? ALT 72 (*)   ? Alkaline Phosphatase 136 (*)   ? Total Bilirubin 2.0 (*)   ? Bilirubin, Direct 0.9 (*)   ? Indirect Bilirubin 1.1 (*)   ? All other components within normal limits  ? ? ?EKG ?EKG Interpretation ? ?Date/Time:  Thursday Dec 18 2021 19:25:37 EDT ?Ventricular Rate:  110 ?PR Interval:  158 ?QRS Duration: 107 ?QT Interval:  346 ?QTC Calculation: 468 ?R Axis:   38 ?Text Interpretation: Sinus tachycardia Probable anteroseptal infarct, old Borderline repolarization abnormality Confirmed by Milton Ferguson [id number]) on 12/18/2021 8:18:29 PM ? ?Radiology ?DG Chest Port 1 View ? ?Result Date: 12/18/2021 ?CLINICAL DATA:  Shortness of breath, chest pain, cough EXAM: PORTABLE CHEST 1 VIEW COMPARISON:  12/13/2021 FINDINGS: Cardiomegaly. Aortic atherosclerosis. Interstitial prominence throughout the lungs. No confluent airspace opacities or effusions. No acute bony abnormality. IMPRESSION: Stable chronic changes.  No active disease. Cardiomegaly. Electronically Signed   By: Rolm Baptise M.D.   On: 12/18/2021 19:18   ? ?Procedures ?Procedures  ? ? ?Medications Ordered in ED ?Medications  ?methylPREDNISolone sodium succinate (SOLU-MEDROL) 125 mg/2 mL injection 125 mg (125 mg Intravenous Given 12/18/21 1931)  ?ipratropium-albuterol (DUONEB) 0.5-2.5 (3) MG/3ML nebulizer solution 3 mL (3 mLs Nebulization Given 12/18/21 1918)  ?albuterol (PROVENTIL) (2.5 MG/3ML) 0.083% nebulizer solution 2.5 mg (2.5 mg Nebulization Given 12/18/21 1918)  ?magnesium sulfate IVPB 2 g 50 mL (0 g Intravenous Stopped 12/18/21 2033)  ?sodium chloride 0.9 % bolus 500 mL (500 mLs Intravenous New Bag/Given 12/18/21 2022)  ? ? ?ED Course/ Medical Decision Making/ A&P ? CRITICAL CARE ?Performed by: Milton Ferguson ?Total critical care  time: 45 minutes ?Critical care time was exclusive of separately billable procedures and treating other patients. ?Critical care was necessary to treat or prevent imminent or life-threatening deterioration. ?Critical care was time spent personally by me on the following activities: development of treatment plan with patient and/or surrogate as well as nursing, discussions with consultants, evaluation of patient's response to treatment, examination of patient, obtaining history from patient or surrogate, ordering and performing treatments and interventions, ordering and review of laboratory studies, ordering and review of radiographic studies, pulse oximetry and re-evaluation of patient's condition. ? ?                        ?Medical Decision Making ?Amount and/or Complexity of Data Reviewed ?Labs: ordered. ?ECG/medicine tests: ordered. ? ?Risk ?Prescription drug management. ?Decision regarding hospitalization. ? ? ?  Patient with hyponatremia.  Also congestive heart failure and COPD.  He will be admitted to medicine ? ? ? ? ? ? ? ?Final Clinical Impression(s) / ED Diagnoses ?Final diagnoses:  ?Hyponatremia  ? ? ?Rx / DC Orders ?ED Discharge Orders   ? ? None  ? ?  ? ? ?  ?Milton Ferguson, MD ?12/18/21 2112 ? ?
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
## 2021-12-18 NOTE — ED Triage Notes (Signed)
Pt brought in by [facility] from home with c/o SOB. EMS reports pt's O2 sat 97% on RA upon their arrival with tight lung sounds. Pt was placed on NRB with increase in O2 sat to 100%. Labored breathing noted upon arrival.  ?

## 2021-12-18 NOTE — ED Notes (Signed)
Pt taken of non rebreather and placed on 4 lpm o2 via [place].  O2 sat remain 100% ? ?

## 2021-12-19 ENCOUNTER — Inpatient Hospital Stay (HOSPITAL_COMMUNITY): Payer: Medicaid Other

## 2021-12-19 DIAGNOSIS — F1010 Alcohol abuse, uncomplicated: Secondary | ICD-10-CM | POA: Diagnosis not present

## 2021-12-19 DIAGNOSIS — J9601 Acute respiratory failure with hypoxia: Secondary | ICD-10-CM

## 2021-12-19 DIAGNOSIS — E871 Hypo-osmolality and hyponatremia: Secondary | ICD-10-CM | POA: Diagnosis not present

## 2021-12-19 DIAGNOSIS — I426 Alcoholic cardiomyopathy: Secondary | ICD-10-CM

## 2021-12-19 DIAGNOSIS — I5043 Acute on chronic combined systolic (congestive) and diastolic (congestive) heart failure: Secondary | ICD-10-CM | POA: Diagnosis not present

## 2021-12-19 DIAGNOSIS — K7030 Alcoholic cirrhosis of liver without ascites: Secondary | ICD-10-CM

## 2021-12-19 LAB — CBC
HCT: 37.3 % — ABNORMAL LOW (ref 39.0–52.0)
Hemoglobin: 13.1 g/dL (ref 13.0–17.0)
MCH: 33.2 pg (ref 26.0–34.0)
MCHC: 35.1 g/dL (ref 30.0–36.0)
MCV: 94.4 fL (ref 80.0–100.0)
Platelets: 164 10*3/uL (ref 150–400)
RBC: 3.95 MIL/uL — ABNORMAL LOW (ref 4.22–5.81)
RDW: 16.4 % — ABNORMAL HIGH (ref 11.5–15.5)
WBC: 6.4 10*3/uL (ref 4.0–10.5)
nRBC: 0 % (ref 0.0–0.2)

## 2021-12-19 LAB — COMPREHENSIVE METABOLIC PANEL
ALT: 76 U/L — ABNORMAL HIGH (ref 0–44)
AST: 210 U/L — ABNORMAL HIGH (ref 15–41)
Albumin: 3.2 g/dL — ABNORMAL LOW (ref 3.5–5.0)
Alkaline Phosphatase: 148 U/L — ABNORMAL HIGH (ref 38–126)
Anion gap: 13 (ref 5–15)
BUN: 14 mg/dL (ref 6–20)
CO2: 21 mmol/L — ABNORMAL LOW (ref 22–32)
Calcium: 8.3 mg/dL — ABNORMAL LOW (ref 8.9–10.3)
Chloride: 91 mmol/L — ABNORMAL LOW (ref 98–111)
Creatinine, Ser: 0.58 mg/dL — ABNORMAL LOW (ref 0.61–1.24)
GFR, Estimated: >60 mL/min (ref >60)
Glucose, Bld: 137 mg/dL — ABNORMAL HIGH (ref 70–99)
Potassium: 3.9 mmol/L (ref 3.5–5.1)
Sodium: 125 mmol/L — ABNORMAL LOW (ref 135–145)
Total Bilirubin: 1.8 mg/dL — ABNORMAL HIGH (ref 0.3–1.2)
Total Protein: 6.8 g/dL (ref 6.5–8.1)

## 2021-12-19 LAB — OSMOLALITY, URINE: Osmolality, Ur: 163 mOsm/kg — ABNORMAL LOW (ref 300–900)

## 2021-12-19 LAB — SODIUM
Sodium: 122 mmol/L — ABNORMAL LOW (ref 135–145)
Sodium: 126 mmol/L — ABNORMAL LOW (ref 135–145)
Sodium: 131 mmol/L — ABNORMAL LOW (ref 135–145)

## 2021-12-19 LAB — SODIUM, URINE, RANDOM: Sodium, Ur: 35 mmol/L

## 2021-12-19 LAB — GLUCOSE, CAPILLARY
Glucose-Capillary: 142 mg/dL — ABNORMAL HIGH (ref 70–99)
Glucose-Capillary: 212 mg/dL — ABNORMAL HIGH (ref 70–99)
Glucose-Capillary: 187 mg/dL — ABNORMAL HIGH (ref 70–99)
Glucose-Capillary: 211 mg/dL — ABNORMAL HIGH (ref 70–99)

## 2021-12-19 LAB — OSMOLALITY: Osmolality: 274 mOsm/kg — ABNORMAL LOW (ref 275–295)

## 2021-12-19 LAB — HEMOGLOBIN A1C
Hgb A1c MFr Bld: 5.4 % (ref 4.8–5.6)
Mean Plasma Glucose: 108.28 mg/dL

## 2021-12-19 MED ORDER — NYSTATIN 100000 UNIT/ML MT SUSP
5.0000 mL | Freq: Four times a day (QID) | OROMUCOSAL | Status: DC
  Administered 2021-12-19 – 2021-12-22 (×14): 500000 [IU] via ORAL
  Filled 2021-12-19 (×13): qty 5

## 2021-12-19 MED ORDER — CHLORHEXIDINE GLUCONATE CLOTH 2 % EX PADS
6.0000 | MEDICATED_PAD | Freq: Every day | CUTANEOUS | Status: DC
  Administered 2021-12-19 – 2021-12-22 (×4): 6 via TOPICAL

## 2021-12-19 MED ORDER — CARVEDILOL 3.125 MG PO TABS
3.1250 mg | ORAL_TABLET | Freq: Two times a day (BID) | ORAL | Status: DC
  Administered 2021-12-19 – 2021-12-22 (×7): 3.125 mg via ORAL
  Filled 2021-12-19 (×7): qty 1

## 2021-12-19 NOTE — Assessment & Plan Note (Addendum)
--   markedly volume overloaded ?-- continue IV lasix as ordered ?-- monitor I/Os, weights, electrolytes  ?-- requested REdSclips Protocol  ? ?Intake/Output Summary (Last 24 hours) at 12/22/2021 1122 ?Last data filed at 12/22/2021 0900 ?Gross per 24 hour  ?Intake 1300 ml  ?Output 1500 ml  ?Net -200 ml  ? ?Filed Weights  ? 12/20/21 0500 12/21/21 0440 12/22/21 0551  ?Weight: 59.6 kg 64.6 kg 61.1 kg  ? ? ?

## 2021-12-19 NOTE — Assessment & Plan Note (Signed)
--   EF down to 30% in Jan 23  ?-- continue diuresis as ordered  ?

## 2021-12-19 NOTE — Hospital Course (Addendum)
61 y.o. male,with medical history significant of systolic CHF, COPD, type II DM, tobacco abuse, alcohol abuse, patient with recent hospitalization due to respiratory failure, COPD, CHF, he left AMA, patient presents to ED today with complaints of shortness of breath, patient report when he left AMA, he continued to drink beer 12 packs/day, reports he still smoking, but significantly cut down to 1 cigarette/day, as he presents with dyspnea, cough, wheezing, productive yellow phlegm, he denies fever, chills, chest pain, no nausea or vomiting. In ED patient with diminished air entry, diffuse wheezing, chest x-ray significant for cardiomegaly, but no pneumonia, patient improved with IV Solu-Medrol, and nebulizer treatment, her work-up significant for sodium of 116, AST of 216, ALT of 72, BNP elevated at 2680, platelet count of 149, [facility] hospitalist consulted to admit. ?

## 2021-12-19 NOTE — Assessment & Plan Note (Addendum)
--   pt continues to drink heavily  ?-- he remains on CIWA protocol  ?-- IV lorazepam is being used to calm the cravings ?

## 2021-12-19 NOTE — Progress Notes (Signed)
PT Cancellation Note ? ?Patient Details ?Name: Francisco Robinson ?MRN: [id number] ?DOB: 1961/08/04 ? ? ?Cancelled Treatment:    Reason Eval/Treat Not Completed: Fatigue/lethargy limiting ability to participate.  Patient did not respond to questions or participate with therapy possibly due to drowsiness - RN notified.   ? ?10:35 AM, 12/19/21 ?Lonell Grandchild, MPT ?Physical Therapist with [place] ?[facility] ?[phone] office ?[phone] mobile phone ? ?

## 2021-12-19 NOTE — Assessment & Plan Note (Addendum)
--   secondary to chronic alcohol abuse ?-- trending down ?

## 2021-12-19 NOTE — Progress Notes (Signed)
Nurse called for patient to have BiPAP / CPAP. Patient states he needed to have big mask to blow air so he could breath. Complains nasal cannula doesn't give him enough air. Patient given home BiPAP ( dream station ) set 14/8 with 4 liters added to [employer]. He also wants fan to blow on him. Nurse notified. It is doubtful he will use this much if at any. At his normal state he appears intoxicated.Francisco Robinson  ?

## 2021-12-19 NOTE — Assessment & Plan Note (Addendum)
--   continue SSI coverage and CBG monitoring  ?CBG (last 3)  ?Recent Labs  ?  12/21/21 ?2106 12/22/21 ?0724 12/22/21 ?1113  ?GLUCAP 199* 114* 152*  ? ? ?

## 2021-12-19 NOTE — Assessment & Plan Note (Addendum)
--   improving with supportive treatments  ?-- nightly cpap ordered ?-- he is now in telemetry ?-- Redclips ordered. ?

## 2021-12-19 NOTE — Assessment & Plan Note (Signed)
--   continue bronchodilators, steroids and supplemental oxygen ?-- continue antibiotics as ordered  ?-- follow clinically  ?

## 2021-12-19 NOTE — Assessment & Plan Note (Addendum)
--   stable, continue carvedilol as ordered, follow ?-- DC hydralazine as BP trending down  ?

## 2021-12-19 NOTE — TOC Initial Note (Signed)
Transition of Care (TOC) - Initial/Assessment Note  ? ? ?Patient Details  ?Name: Francisco Robinson ?MRN: [id number] ?Date of Birth: 1961-04-15 ? ?Transition of Care (TOC) CM/SW Contact:    ?Kamaiya Antilla D, LCSW ?Phone Number: ?12/19/2021, 4:54 PM ? ?Clinical Narrative:                 ?Pt admitted due to hyponatremia. Well known to [place]. Pt is high risk of readmission. TOC also received consult for substance abuse. TOC has discussed substance use with pt 8 times in the past 5 months. He accepted treatment resource list at one point, but now said he doesn't have any substance use issues when assessed on 11/25/21. Per previous note, Pt lives alone. He reports he ambulates with a cane and relies on [facility] or a friend for transport. TOC will continue to follow and assist with d/c planning needs.                ? ?Expected Discharge Plan: Home/Self Care ?  ? ? ?Patient Goals and CMS Choice ?  ?  ?  ? ?Expected Discharge Plan and Services ?Expected Discharge Plan: Home/Self Care ?  ?  ?  ?  ?                ?  ?  ?  ?  ?  ?  ?  ?  ?  ?  ? ?Prior Living Arrangements/Services ?  ?  ?  ?       ?  ?  ?  ?  ? ?Activities of Daily Living ?Home Assistive Devices/Equipment: None ?ADL Screening (condition at time of admission) ?Patient's cognitive ability adequate to safely complete daily activities?: Yes ?Is the patient deaf or have difficulty hearing?: No ?Does the patient have difficulty seeing, even when wearing glasses/contacts?: No ?Does the patient have difficulty concentrating, remembering, or making decisions?: Yes ?Patient able to express need for assistance with ADLs?: Yes ?Does the patient have difficulty dressing or bathing?: No ?Independently performs ADLs?: Yes (appropriate for developmental age) ?Does the patient have difficulty walking or climbing stairs?: Yes ?Weakness of Legs: None ?Weakness of Arms/Hands: None ? ?Permission Sought/Granted ?  ?  ?   ?   ?   ?   ? ?Emotional Assessment ?  ?  ?  ?  ?  ?  ? ?Admission  diagnosis:  Hyponatremia [E87.1] ?Patient Active Problem List  ? Diagnosis Date Noted  ? Transaminitis 12/18/2021  ? Elevated troponin 12/13/2021  ? Elevated brain natriuretic peptide (BNP) level 12/13/2021  ? Essential hypertension 12/13/2021  ? CHF exacerbation ([place]) 11/24/2021  ? Mixed hyperlipidemia 11/24/2021  ? Acute on chronic systolic (congestive) heart failure ([place]) 10/28/2021  ? Hyponatremia 10/28/2021  ? Tobacco abuse 10/28/2021  ? Acute on chronic combined systolic and diastolic CHF (congestive heart failure) ([place]) 10/05/2021  ? COPD exacerbation ([place]) 08/17/2021  ? Noncompliance with medication regimen 08/17/2021  ? Cardiomyopathy ([place]) 07/07/2021  ? Pneumonia 07/05/2021  ? Delirium tremens/AlcoHol Withdrawal 06/17/2021  ? Acute respiratory failure with hypoxia ([place]) 06/14/2021  ? History of stroke 06/14/2021  ? Seizure ([place]) 06/14/2021  ? History of MI (myocardial infarction) 06/14/2021  ? History of ETOH abuse 06/14/2021  ? Diabetes mellitus without complication ([place]) 97/35/3299  ? Hepatic cirrhosis ([place]) 06/11/2014  ? Constipation 06/11/2014  ? Alcohol abuse 07/15/2011  ? Chronic pain due to trauma 07/15/2011  ? ?PCP:  Carrolyn Meiers, MD ?Pharmacy:   ?[facility] [address] ?[place] ?[place] [address] ?Phone: [phone] Fax: [phone] ? ? ? ? ?Social Determinants of Health (SDOH) Interventions ?  ? ?Readmission Risk Interventions ? ?  11/25/2021  ?  8:43 AM 10/29/2021  ?  2:10 PM 08/18/2021  ?  1:43 PM  ?Readmission Risk Prevention Plan  ?Transportation Screening Complete Complete Complete  ?Home Care Screening   Complete  ?Medication Review (RN CM)   Complete  ?Medication Review Press photographer) Complete Complete   ?[place] or Home Care Consult Complete    ?SW Recovery Care/Counseling Consult Complete    ?Palliative Care Screening Not Applicable Not Applicable   ?[place] Not Applicable Not Applicable   ? ? ? ?

## 2021-12-19 NOTE — Evaluation (Signed)
Physical Therapy Evaluation ?Patient Details ?Name: Francisco Robinson ?MRN: [id number] ?DOB: Dec 12, 1960 ?Today's Date: 12/19/2021 ? ?History of Present Illness ? Francisco Robinson  is a 61 y.o. male,with medical history significant of systolic CHF, COPD, type II DM, tobacco abuse, alcohol abuse, patient with recent hospitalization due to respiratory failure, COPD, CHF, he left AMA, patient presents to ED today with complaints of shortness of breath, patient report when he left AMA, he continued to drink beer 12 packs/day, reports he still smoking, but significantly cut down to 1 cigarette/day, as he presents with dyspnea, cough, wheezing, productive yellow phlegm, he denies fever, chills, chest pain, no nausea or vomiting. ?  ?Clinical Impression ? Patient demonstrates ability to perform bed mobility and transfers independently with appropriate strength and balance to do so safely and efficiently. Patient is able to ambulate for medium to long distances independently with appropriate gait pattern that is WNL. Patient discharged to care of nursing for ambulation daily as tolerated for length of stay.   ?   ? ?Recommendations for follow up therapy are one component of a multi-disciplinary discharge planning process, led by the attending physician.  Recommendations may be updated based on patient status, additional functional criteria and insurance authorization. ? ?Follow Up Recommendations No PT follow up ? ?  ?Assistance Recommended at Discharge PRN  ?Patient can return home with the following ? Assistance with cooking/housework;A little help with bathing/dressing/bathroom;Help with stairs or ramp for entrance ? ?  ?Equipment Recommendations None recommended by PT  ?Recommendations for Other Services ?    ?  ?Functional Status Assessment Patient has not had a recent decline in their functional status  ? ?  ?Precautions / Restrictions Precautions ?Precautions: None ?Restrictions ?Weight Bearing Restrictions: No  ? ?   ? ?Mobility ? Bed Mobility ?Overal bed mobility: Independent ?  ?  ?  ?  ?  ?  ?General bed mobility comments: Patient is able to perform supipne to sit with no assist. ?  ? ?Transfers ?Overall transfer level: Independent ?  ?  ?  ?  ?  ?  ?  ?  ?General transfer comment: Patient able to perform supine to sit and bed to chair transfers with independence indicating good return for home. ?  ? ?Ambulation/Gait ?Ambulation/Gait assistance: Independent ?Gait Distance (Feet): 100 Feet ?  ?Gait Pattern/deviations: WFL(Within Functional Limits), Decreased stride length ?Gait velocity: normal ?  ?  ?General Gait Details: Patient able to ambulate indepdendently with no assistive device and no major gait deviations observed. ? ?Stairs ?  ?  ?  ?  ?  ? ?Wheelchair Mobility ?  ? ?Modified Rankin (Stroke Patients Only) ?  ? ?  ? ?Balance Overall balance assessment: Independent, Modified Independent ?  ?  ?  ?  ?  ?  ?  ?  ?  ?  ?  ?  ?  ?  ?  ?  ?  ?  ?   ? ? ? ?Pertinent Vitals/Pain Pain Assessment ?Pain Assessment: No/denies pain  ? ? ?Home Living Family/patient expects to be discharged to:: Private residence ?Living Arrangements: Alone ?  ?Type of Home: Mobile home ?Home Access: Level entry ?  ?  ?  ?Home Layout: One level ?  ?   ?  ?Prior Function Prior Level of Function : Independent/Modified Independent;Needs assist ?  ?  ?  ?Physical Assist : ADLs (physical) ?  ?ADLs (physical): IADLs ?Mobility Comments: Patient reports being able to ambulate independently with intermittent use of  cane ?ADLs Comments: Patient reports needing assistance from home aides that come by "every once in awhile" ?  ? ? ?Hand Dominance  ?   ? ?  ?Extremity/Trunk Assessment  ? Upper Extremity Assessment ?Upper Extremity Assessment: Overall WFL for tasks assessed ?  ? ?Lower Extremity Assessment ?Lower Extremity Assessment: Overall WFL for tasks assessed ?  ? ?Cervical / Trunk Assessment ?Cervical / Trunk Assessment: Normal  ?Communication  ?  Communication: No difficulties  ?Cognition Arousal/Alertness: Awake/alert ?Behavior During Therapy: [facility] for tasks assessed/performed ?Overall Cognitive Status: Within Functional Limits for tasks assessed ?  ?  ?  ?  ?  ?  ?  ?  ?  ?  ?  ?  ?  ?  ?  ?  ?  ?  ?  ? ?  ?General Comments   ? ?  ?Exercises    ? ?Assessment/Plan  ?  ?PT Assessment Patient does not need any further PT services  ?PT Problem List   ? ?   ?  ?PT Treatment Interventions     ? ?PT Goals (Current goals can be found in the Care Plan section)  ?Acute Rehab PT Goals ?Patient Stated Goal: return home ?PT Goal Formulation: With patient ?Time For Goal Achievement: 12/19/21 ?Potential to Achieve Goals: Good ? ?  ?Frequency   ?  ? ? ?Co-evaluation   ?  ?  ?  ?  ? ? ?  ?AM-PAC PT "6 Clicks" Mobility  ?Outcome Measure Help needed turning from your back to your side while in a flat bed without using bedrails?: None ?Help needed moving from lying on your back to sitting on the side of a flat bed without using bedrails?: None ?Help needed moving to and from a bed to a chair (including a wheelchair)?: None ?Help needed standing up from a chair using your arms (e.g., wheelchair or bedside chair)?: None ?Help needed to walk in hospital room?: None ?Help needed climbing 3-5 steps with a railing? : None ?6 Click Score: 24 ? ?  ?End of Session   ?Activity Tolerance: Patient tolerated treatment well ?Patient left: in chair;with nursing/sitter in room ?Nurse Communication: Mobility status ?PT Visit Diagnosis: Unsteadiness on feet (R26.81);Other abnormalities of gait and mobility (R26.89);Muscle weakness (generalized) (M62.81) ?  ? ?Time: 0932-6712 ?PT Time Calculation (min) (ACUTE ONLY): 20 min ? ? ?Charges:   PT Evaluation ?$PT Eval Moderate Complexity: 1 Mod ?PT Treatments ?$Therapeutic Activity: 8-22 mins ?  ?   ? ? ?3:18 PM, 12/19/21 ?Lestine Box, S/PT  ? ?

## 2021-12-19 NOTE — Progress Notes (Signed)
?PROGRESS NOTE ? ? ?Francisco Robinson  JQZ:[id number] DOB: [birth year] DOA: 12/18/2021 ?PCP: Carrolyn Meiers, MD  ? ?Chief Complaint  ?Patient presents with  ? Shortness of Breath  ? ?Level of care: Stepdown ? ?Brief Admission History:  ?61 y.o. male,with medical history significant of systolic CHF, COPD, type II DM, tobacco abuse, alcohol abuse, patient with recent hospitalization due to respiratory failure, COPD, CHF, he left AMA, patient presents to ED today with complaints of shortness of breath, patient report when he left AMA, he continued to drink beer 12 packs/day, reports he still smoking, but significantly cut down to 1 cigarette/day, as he presents with dyspnea, cough, wheezing, productive yellow phlegm, he denies fever, chills, chest pain, no nausea or vomiting. ?- in ED patient with diminished air entry, diffuse wheezing, chest x-ray significant for cardiomegaly, but no pneumonia, patient improved with IV Solu-Medrol, and nebulizer treatment, her work-up significant for sodium of 116, AST of 216, ALT of 72, BNP elevated at 2680, platelet count of 149, [facility] hospitalist consulted to admit. ?  ?Assessment and Plan: ?* Hyponatremia ?-- this is chronic ?-- secondary to volume overload and being managed with diuretics ? ?Transaminitis ?-- secondary to chronic alcohol abuse ?-- will follow ? ?Essential hypertension ?-- stable, continue carvedilol as ordered, follow ? ?Tobacco abuse ?-- will offer nicotine patch  ? ?Acute on chronic combined systolic and diastolic CHF (congestive heart failure) (HCC) ?-- markedly volume overloaded ?-- continue IV lasix as ordered ?-- monitor I/Os, weights, electrolytes  ? ?COPD exacerbation ([place]) ?-- continue bronchodilators, steroids and supplemental oxygen ?-- continue antibiotics as ordered  ?-- follow clinically  ? ?Cardiomyopathy ([place]) ?-- EF down to 30% in Jan 23  ?-- continue diuresis as ordered  ? ?Diabetes mellitus without complication ([place]) ?-- continue SSI  coverage and CBG monitoring  ? ?Alcohol abuse ?-- pt continues to drink heavily  ?-- he remains on CIWA protocol  ? ?Acute respiratory failure with hypoxia ([place]) ?-- improving with supportive treatments  ?-- continue monitoring in stepdown ICU today ? ?Hepatic cirrhosis ([place]) ?-- continue to counsel on alcohol cessation ?-- diuretics as ordered  ?-- [place] ascites: not enough fluid to have a benefit from paracentesis ? ?DVT prophylaxis: enoxaparin ?Code Status: full  ?Family Communication:  ?Disposition: Status is: Inpatient ?Remains inpatient appropriate because: IV diuresis required  ?  ?Consultants:  ? ?Procedures:  ?[place] ascites 5/12  ?Antimicrobials:  ?  ?Subjective: ?Pt reports chronic pain, requesting lorazepam as well.   ?Objective: ?Vitals:  ? 12/19/21 0813 12/19/21 0900 12/19/21 1000 12/19/21 1116  ?BP:  100/69 (!) 138/103   ?Pulse:   (!) 113   ?Resp:  (!) 23 (!) 23   ?Temp:    97.6 ?F (36.4 ?C)  ?TempSrc:    Oral  ?SpO2: 97% 96% 96%   ?Weight:      ?Height:      ? ? ?Intake/Output Summary (Last 24 hours) at 12/19/2021 1233 ?Last data filed at 12/19/2021 1009 ?Gross per 24 hour  ?Intake 1090.18 ml  ?Output 3300 ml  ?Net -2209.82 ml  ? ?Filed Weights  ? 12/18/21 1827 12/19/21 0220  ?Weight: 58.9 kg 60 kg  ? ?Examination: ? ?General exam: chronically ill appearing, Appears calm and comfortable  ?Respiratory system: bibasilar crackles heard diffusely.  Moderate increased work of breathing. ?Cardiovascular system: normal S1 & S2 heard. No JVD, murmurs, rubs, gallops or clicks. No pedal edema. ?Gastrointestinal system: Abdomen is nondistended, soft and nontender. No organomegaly or masses felt. Normal bowel  sounds heard. ?Central nervous system: Alert and oriented. No focal neurological deficits. ?Extremities: trace pretibial edema BLEs.  Symmetric 5 x 5 power. ?Skin: No rashes, lesions or ulcers. ?Psychiatry: Judgement and insight appear poor. Mood & affect appropriate.  ? ?Data Reviewed: I have personally reviewed  following labs and imaging studies ? ?CBC: ?Recent Labs  ?Lab 12/13/21 ?0214 12/14/21 ?2542 12/15/21 ?0423 12/18/21 ?1908 12/19/21 ?0401  ?WBC 13.0* 12.0* 11.0* 8.9 6.4  ?NEUTROABS  --   --   --  6.0  --   ?HGB 12.1* 12.0* 11.5* 12.5* 13.1  ?HCT 36.8* 36.0* 34.2* 35.7* 37.3*  ?MCV 98.4 98.1 98.6 94.7 94.4  ?PLT 133* 156 157 149* 164  ? ? ?Basic Metabolic Panel: ?Recent Labs  ?Lab 12/13/21 ?1000 12/13/21 ?1212 12/14/21 ?7062 12/15/21 ?0423 12/18/21 ?1908 12/18/21 ?2352 12/19/21 ?0401  ?NA  --  130* 131* 128* 116* 122* 125*  ?K  --  3.7 3.6 3.5 4.2  --  3.9  ?CL  --  88* 93* 91* 84*  --  91*  ?CO2  --  '30 29 27 '$ 20*  --  21*  ?GLUCOSE  --  183* 140* 166* 96  --  137*  ?BUN  --  '14 15 20 14  '$ --  14  ?CREATININE  --  0.79 0.50* 0.75 0.75  --  0.58*  ?CALCIUM  --  8.7* 8.6* 8.5* 7.7*  --  8.3*  ?MG 1.5*  --   --  1.5*  --   --   --   ? ? ?CBG: ?Recent Labs  ?Lab 12/14/21 ?2149 12/15/21 ?3762 12/18/21 ?2338 12/19/21 ?0726 12/19/21 ?1123  ?GLUCAP 177* 139* 160* 142* 212*  ? ? ?Recent Results (from the past 240 hour(s))  ?Group A Strep by PCR     Status: None  ? Collection Time: 12/13/21  1:49 AM  ? Specimen: Throat; Sterile Swab  ?Result Value Ref Range Status  ? Group A Strep by PCR NOT DETECTED NOT DETECTED Final  ?  Comment: Performed at [facility], [address], [place], [place] [address]  ?Resp Panel by RT-PCR (Flu A&B, Covid) Nasopharyngeal Swab     Status: None  ? Collection Time: 12/13/21  8:59 AM  ? Specimen: Nasopharyngeal Swab; Nasopharyngeal(NP) swabs in vial transport medium  ?Result Value Ref Range Status  ? SARS Coronavirus 2 by RT PCR NEGATIVE NEGATIVE Final  ?  Comment: (NOTE) ?SARS-CoV-2 target nucleic acids are NOT DETECTED. ? ?The SARS-CoV-2 RNA is generally detectable in upper respiratory ?specimens during the acute phase of infection. The lowest ?concentration of SARS-CoV-2 viral copies this assay can detect is ?138 copies/mL. A negative result does not preclude SARS-Cov-2 ?infection and should not  be used as the sole basis for treatment or ?other patient management decisions. A negative result may occur with  ?improper specimen collection/handling, submission of specimen other ?than nasopharyngeal swab, presence of viral mutation(s) within the ?areas targeted by this assay, and inadequate number of viral ?copies(<138 copies/mL). A negative result must be combined with ?clinical observations, patient history, and epidemiological ?information. The expected result is Negative. ? ?Fact Sheet for Patients:  ?EntrepreneurPulse.com.au ? ?Fact Sheet for Healthcare Providers:  ?IncredibleEmployment.be ? ?This test is no t yet approved or cleared by the [place] FDA and  ?has been authorized for detection and/or diagnosis of SARS-CoV-2 by ?FDA under an Emergency Use Authorization (EUA). This EUA will remain  ?in effect (meaning this test can be used) for the duration of the ?COVID-19  declaration under Section 564(b)(1) of the Act, 21 ?U.S.C.section 360bbb-3(b)(1), unless the authorization is terminated  ?or revoked sooner.  ? ? ?  ? Influenza A by PCR NEGATIVE NEGATIVE Final  ? Influenza B by PCR NEGATIVE NEGATIVE Final  ?  Comment: (NOTE) ?The Xpert Xpress SARS-CoV-2/FLU/RSV plus assay is intended as an aid ?in the diagnosis of influenza from Nasopharyngeal swab specimens and ?should not be used as a sole basis for treatment. Nasal washings and ?aspirates are unacceptable for Xpert Xpress SARS-CoV-2/FLU/RSV ?testing. ? ?Fact Sheet for Patients: ?EntrepreneurPulse.com.au ? ?Fact Sheet for Healthcare Providers: ?IncredibleEmployment.be ? ?This test is not yet approved or cleared by the [place] FDA and ?has been authorized for detection and/or diagnosis of SARS-CoV-2 by ?FDA under an Emergency Use Authorization (EUA). This EUA will remain ?in effect (meaning this test can be used) for the duration of the ?COVID-19 declaration under Section  564(b)(1) of the Act, 21 U.S.C. ?section 360bbb-3(b)(1), unless the authorization is terminated or ?revoked. ? ?Performed at [facility], [address] [place], [place] [address] ?  ?MRSA Next Gen by PCR, N

## 2021-12-19 NOTE — Assessment & Plan Note (Addendum)
--   will offer nicotine patch  ?-- counseled on tobacco cessation  ?

## 2021-12-19 NOTE — Assessment & Plan Note (Signed)
--   continue to counsel on alcohol cessation ?-- diuretics as ordered  ?-- [place] ascites: not enough fluid to have a benefit from paracentesis ?

## 2021-12-19 NOTE — Assessment & Plan Note (Addendum)
--   this is chronic ?-- secondary to volume overload and being managed with diuretics ?-- slightly improved ?

## 2021-12-20 LAB — CBC
HCT: 35.1 % — ABNORMAL LOW (ref 39.0–52.0)
Hemoglobin: 11.8 g/dL — ABNORMAL LOW (ref 13.0–17.0)
MCH: 32.8 pg (ref 26.0–34.0)
MCHC: 33.6 g/dL (ref 30.0–36.0)
MCV: 97.5 fL (ref 80.0–100.0)
Platelets: 168 10*3/uL (ref 150–400)
RBC: 3.6 MIL/uL — ABNORMAL LOW (ref 4.22–5.81)
RDW: 16.5 % — ABNORMAL HIGH (ref 11.5–15.5)
WBC: 18.4 10*3/uL — ABNORMAL HIGH (ref 4.0–10.5)
nRBC: 0 % (ref 0.0–0.2)

## 2021-12-20 LAB — COMPREHENSIVE METABOLIC PANEL
ALT: 61 U/L — ABNORMAL HIGH (ref 0–44)
AST: 141 U/L — ABNORMAL HIGH (ref 15–41)
Albumin: 2.7 g/dL — ABNORMAL LOW (ref 3.5–5.0)
Alkaline Phosphatase: 147 U/L — ABNORMAL HIGH (ref 38–126)
Anion gap: 6 (ref 5–15)
BUN: 20 mg/dL (ref 6–20)
CO2: 28 mmol/L (ref 22–32)
Calcium: 8.5 mg/dL — ABNORMAL LOW (ref 8.9–10.3)
Chloride: 97 mmol/L — ABNORMAL LOW (ref 98–111)
Creatinine, Ser: 0.65 mg/dL (ref 0.61–1.24)
GFR, Estimated: >60 mL/min (ref >60)
Glucose, Bld: 159 mg/dL — ABNORMAL HIGH (ref 70–99)
Potassium: 3.7 mmol/L (ref 3.5–5.1)
Sodium: 131 mmol/L — ABNORMAL LOW (ref 135–145)
Total Bilirubin: 1.2 mg/dL (ref 0.3–1.2)
Total Protein: 5.9 g/dL — ABNORMAL LOW (ref 6.5–8.1)

## 2021-12-20 LAB — GLUCOSE, CAPILLARY
Glucose-Capillary: 192 mg/dL — ABNORMAL HIGH (ref 70–99)
Glucose-Capillary: 142 mg/dL — ABNORMAL HIGH (ref 70–99)
Glucose-Capillary: 281 mg/dL — ABNORMAL HIGH (ref 70–99)
Glucose-Capillary: 170 mg/dL — ABNORMAL HIGH (ref 70–99)

## 2021-12-20 LAB — MAGNESIUM: Magnesium: 1.8 mg/dL (ref 1.7–2.4)

## 2021-12-20 LAB — SODIUM: Sodium: 127 mmol/L — ABNORMAL LOW (ref 135–145)

## 2021-12-20 MED ORDER — HALOPERIDOL 0.5 MG PO TABS
1.0000 mg | ORAL_TABLET | Freq: Four times a day (QID) | ORAL | Status: DC | PRN
  Administered 2021-12-21: 1 mg via ORAL
  Filled 2021-12-20: qty 2

## 2021-12-20 MED ORDER — RIVAROXABAN 10 MG PO TABS
10.0000 mg | ORAL_TABLET | Freq: Every day | ORAL | Status: DC
  Administered 2021-12-20 – 2021-12-21 (×2): 10 mg via ORAL
  Filled 2021-12-20 (×4): qty 1

## 2021-12-20 MED ORDER — HALOPERIDOL LACTATE 5 MG/ML IJ SOLN
1.0000 mg | Freq: Four times a day (QID) | INTRAMUSCULAR | Status: DC | PRN
  Administered 2021-12-20: 1 mg via INTRAMUSCULAR
  Filled 2021-12-20: qty 1

## 2021-12-20 MED ORDER — PHENOL 1.4 % MT LIQD
1.0000 | OROMUCOSAL | Status: DC | PRN
  Administered 2021-12-20: 1 via OROMUCOSAL
  Filled 2021-12-20: qty 177

## 2021-12-20 NOTE — Progress Notes (Signed)
Patient refuses his heparin shots, informed MD.  ?

## 2021-12-20 NOTE — Progress Notes (Addendum)
?PROGRESS NOTE ? ? ?Francisco Robinson  TIR:[id number] DOB: [birth year] DOA: 12/18/2021 ?PCP: Carrolyn Meiers, MD  ? ?Chief Complaint  ?Patient presents with  ? Shortness of Breath  ? ?Level of care: Telemetry ? ?Brief Admission History:  ?61 y.o. male,with medical history significant of systolic CHF, COPD, type II DM, tobacco abuse, alcohol abuse, patient with recent hospitalization due to respiratory failure, COPD, CHF, he left AMA, patient presents to ED today with complaints of shortness of breath, patient report when he left AMA, he continued to drink beer 12 packs/day, reports he still smoking, but significantly cut down to 1 cigarette/day, as he presents with dyspnea, cough, wheezing, productive yellow phlegm, he denies fever, chills, chest pain, no nausea or vomiting. In ED patient with diminished air entry, diffuse wheezing, chest x-ray significant for cardiomegaly, but no pneumonia, patient improved with IV Solu-Medrol, and nebulizer treatment, her work-up significant for sodium of 116, AST of 216, ALT of 72, BNP elevated at 2680, platelet count of 149, [facility] hospitalist consulted to admit. ?  ?Assessment and Plan: ?* Hyponatremia ?-- this is chronic ?-- secondary to volume overload and being managed with diuretics ?-- slightly improved today ? ?Transaminitis ?-- secondary to chronic alcohol abuse ?-- will follow ? ?Essential hypertension ?-- stable, continue carvedilol as ordered, follow ? ?Tobacco abuse ?-- will offer nicotine patch  ?-- counseled on tobacco cessation  ? ?Acute on chronic combined systolic and diastolic CHF (congestive heart failure) (HCC) ?-- markedly volume overloaded ?-- continue IV lasix as ordered ?-- monitor I/Os, weights, electrolytes  ? ? ?Intake/Output Summary (Last 24 hours) at 12/20/2021 1532 ?Last data filed at 12/20/2021 1000 ?Gross per 24 hour  ?Intake 600 ml  ?Output 4015 ml  ?Net -3415 ml  ? ?Filed Weights  ? 12/18/21 1827 12/19/21 0220 12/20/21 0500  ?Weight: 58.9 kg 60  kg 59.6 kg  ? ? ? ?COPD exacerbation ([place]) ?-- continue bronchodilators, steroids and supplemental oxygen ?-- continue antibiotics as ordered  ?-- follow clinically  ? ?Cardiomyopathy ([place]) ?-- EF down to 30% in Jan 23  ?-- continue diuresis as ordered  ? ?Diabetes mellitus without complication ([place]) ?-- continue SSI coverage and CBG monitoring  ?CBG (last 3)  ?Recent Labs  ?  12/19/21 ?2103 12/20/21 ?0735 12/20/21 ?6761  ?GLUCAP 211* 192* 142*  ? ? ? ?Alcohol abuse ?-- pt continues to drink heavily  ?-- he remains on CIWA protocol  ? ?Acute respiratory failure with hypoxia ([place]) ?-- improving with supportive treatments  ?-- continue monitoring in stepdown ICU today ? ?Hepatic cirrhosis ([place]) ?-- continue to counsel on alcohol cessation ?-- diuretics as ordered  ?-- [place] ascites: not enough fluid to have a benefit from paracentesis ? ?DVT prophylaxis: rivaroxaban (refuses heparin shots) ?Code Status: full  ?Family Communication:  ?Disposition: Status is: Inpatient ?Remains inpatient appropriate because: IV diuresis required  ?  ?Consultants:  ? ?Procedures:  ?[place] ascites 5/12  ?Antimicrobials:  ?  ?Subjective: ?Pt reports he is breathing better today.     ?Objective: ?Vitals:  ? 12/20/21 0842 12/20/21 0843 12/20/21 0915 12/20/21 1140  ?BP:   138/86   ?Pulse: (!) 112  (!) 106   ?Resp: (!) 23     ?Temp:  98 ?F (36.7 ?C)  98.1 ?F (36.7 ?C)  ?TempSrc:  Oral  Oral  ?SpO2: 100% 100% 100%   ?Weight:      ?Height:      ? ? ?Intake/Output Summary (Last 24 hours) at 12/20/2021 1533 ?Last data filed  at 12/20/2021 1000 ?Gross per 24 hour  ?Intake 600 ml  ?Output 4015 ml  ?Net -3415 ml  ? ?Filed Weights  ? 12/18/21 1827 12/19/21 0220 12/20/21 0500  ?Weight: 58.9 kg 60 kg 59.6 kg  ? ?Examination: ? ?General exam: chronically ill appearing, Appears calm and comfortable  ?Respiratory system: improving bibasilar crackles heard.  No increased work of breathing. ?Cardiovascular system: normal S1 & S2 heard. No JVD, murmurs, rubs, gallops  or clicks. No pedal edema. ?Gastrointestinal system: Abdomen is nondistended, soft and nontender. No organomegaly or masses felt. Normal bowel sounds heard. ?Central nervous system: Alert and oriented. No focal neurological deficits. ?Extremities: trace pretibial edema BLEs.  Symmetric 5 x 5 power. ?Skin: No rashes, lesions or ulcers. ?Psychiatry: Judgement and insight appear poor. Mood & affect appropriate.  ? ?Data Reviewed: I have personally reviewed following labs and imaging studies ? ?CBC: ?Recent Labs  ?Lab 12/14/21 ?0613 12/15/21 ?0423 12/18/21 ?1908 12/19/21 ?0401 12/20/21 ?0300  ?WBC 12.0* 11.0* 8.9 6.4 18.4*  ?NEUTROABS  --   --  6.0  --   --   ?HGB 12.0* 11.5* 12.5* 13.1 11.8*  ?HCT 36.0* 34.2* 35.7* 37.3* 35.1*  ?MCV 98.1 98.6 94.7 94.4 97.5  ?PLT 156 157 149* 164 168  ? ? ?Basic Metabolic Panel: ?Recent Labs  ?Lab 12/14/21 ?9233 12/15/21 ?0423 12/18/21 ?1908 12/18/21 ?2352 12/19/21 ?0401 12/19/21 ?1205 12/19/21 ?1736 12/19/21 ?0076 12/20/21 ?2263  ?NA 131* 128* 116*   < > 125* 126* 131* 127* 131*  ?K 3.6 3.5 4.2  --  3.9  --   --   --  3.7  ?CL 93* 91* 84*  --  91*  --   --   --  97*  ?CO2 29 27 20*  --  21*  --   --   --  28  ?GLUCOSE 140* 166* 96  --  137*  --   --   --  159*  ?BUN '15 20 14  '$ --  14  --   --   --  20  ?CREATININE 0.50* 0.75 0.75  --  0.58*  --   --   --  0.65  ?CALCIUM 8.6* 8.5* 7.7*  --  8.3*  --   --   --  8.5*  ?MG  --  1.5*  --   --   --   --   --   --  1.8  ? < > = values in this interval not displayed.  ? ? ?CBG: ?Recent Labs  ?Lab 12/19/21 ?1123 12/19/21 ?1516 12/19/21 ?2103 12/20/21 ?0735 12/20/21 ?1132  ?GLUCAP 212* 187* 211* 192* 142*  ? ? ?Recent Results (from the past 240 hour(s))  ?Group A Strep by PCR     Status: None  ? Collection Time: 12/13/21  1:49 AM  ? Specimen: Throat; Sterile Swab  ?Result Value Ref Range Status  ? Group A Strep by PCR NOT DETECTED NOT DETECTED Final  ?  Comment: Performed at [facility], [address], [place],  [address]  ?Resp Panel by  RT-PCR (Flu A&B, Covid) Nasopharyngeal Swab     Status: None  ? Collection Time: 12/13/21  8:59 AM  ? Specimen: Nasopharyngeal Swab; Nasopharyngeal(NP) swabs in vial transport medium  ?Result Value Ref Range Status  ? SARS Coronavirus 2 by RT PCR NEGATIVE NEGATIVE Final  ?  Comment: (NOTE) ?SARS-CoV-2 target nucleic acids are NOT DETECTED. ? ?The SARS-CoV-2 RNA is generally detectable in upper respiratory ?specimens during the acute  phase of infection. The lowest ?concentration of SARS-CoV-2 viral copies this assay can detect is ?138 copies/mL. A negative result does not preclude SARS-Cov-2 ?infection and should not be used as the sole basis for treatment or ?other patient management decisions. A negative result may occur with  ?improper specimen collection/handling, submission of specimen other ?than nasopharyngeal swab, presence of viral mutation(s) within the ?areas targeted by this assay, and inadequate number of viral ?copies(<138 copies/mL). A negative result must be combined with ?clinical observations, patient history, and epidemiological ?information. The expected result is Negative. ? ?Fact Sheet for Patients:  ?EntrepreneurPulse.com.au ? ?Fact Sheet for Healthcare Providers:  ?IncredibleEmployment.be ? ?This test is no t yet approved or cleared by the [place] FDA and  ?has been authorized for detection and/or diagnosis of SARS-CoV-2 by ?FDA under an Emergency Use Authorization (EUA). This EUA will remain  ?in effect (meaning this test can be used) for the duration of the ?COVID-19 declaration under Section 564(b)(1) of the Act, 21 ?U.S.C.section 360bbb-3(b)(1), unless the authorization is terminated  ?or revoked sooner.  ? ? ?  ? Influenza A by PCR NEGATIVE NEGATIVE Final  ? Influenza B by PCR NEGATIVE NEGATIVE Final  ?  Comment: (NOTE) ?The Xpert Xpress SARS-CoV-2/FLU/RSV plus assay is intended as an aid ?in the diagnosis of influenza from Nasopharyngeal swab  specimens and ?should not be used as a sole basis for treatment. Nasal washings and ?aspirates are unacceptable for Xpert Xpress SARS-CoV-2/FLU/RSV ?testing. ? ?Fact Sheet for Patients: ?https://www.fda.

## 2021-12-21 ENCOUNTER — Inpatient Hospital Stay (HOSPITAL_COMMUNITY): Payer: Medicaid Other

## 2021-12-21 LAB — COMPREHENSIVE METABOLIC PANEL
ALT: 63 U/L — ABNORMAL HIGH (ref 0–44)
AST: 148 U/L — ABNORMAL HIGH (ref 15–41)
Albumin: 2.5 g/dL — ABNORMAL LOW (ref 3.5–5.0)
Alkaline Phosphatase: 167 U/L — ABNORMAL HIGH (ref 38–126)
Anion gap: 7 (ref 5–15)
BUN: 19 mg/dL (ref 6–20)
CO2: 28 mmol/L (ref 22–32)
Calcium: 8.1 mg/dL — ABNORMAL LOW (ref 8.9–10.3)
Chloride: 98 mmol/L (ref 98–111)
Creatinine, Ser: 0.58 mg/dL — ABNORMAL LOW (ref 0.61–1.24)
GFR, Estimated: >60 mL/min (ref >60)
Glucose, Bld: 160 mg/dL — ABNORMAL HIGH (ref 70–99)
Potassium: 3.3 mmol/L — ABNORMAL LOW (ref 3.5–5.1)
Sodium: 133 mmol/L — ABNORMAL LOW (ref 135–145)
Total Bilirubin: 1 mg/dL (ref 0.3–1.2)
Total Protein: 5.4 g/dL — ABNORMAL LOW (ref 6.5–8.1)

## 2021-12-21 LAB — GLUCOSE, CAPILLARY
Glucose-Capillary: 160 mg/dL — ABNORMAL HIGH (ref 70–99)
Glucose-Capillary: 176 mg/dL — ABNORMAL HIGH (ref 70–99)
Glucose-Capillary: 228 mg/dL — ABNORMAL HIGH (ref 70–99)
Glucose-Capillary: 199 mg/dL — ABNORMAL HIGH (ref 70–99)

## 2021-12-21 LAB — MAGNESIUM: Magnesium: 1.6 mg/dL — ABNORMAL LOW (ref 1.7–2.4)

## 2021-12-21 MED ORDER — DOXYCYCLINE HYCLATE 100 MG PO TABS
100.0000 mg | ORAL_TABLET | Freq: Two times a day (BID) | ORAL | Status: DC
  Administered 2021-12-21 – 2021-12-22 (×3): 100 mg via ORAL
  Filled 2021-12-21 (×3): qty 1

## 2021-12-21 MED ORDER — FUROSEMIDE 10 MG/ML IJ SOLN
30.0000 mg | Freq: Every day | INTRAMUSCULAR | Status: DC
  Administered 2021-12-21 – 2021-12-22 (×2): 30 mg via INTRAVENOUS
  Filled 2021-12-21 (×2): qty 4

## 2021-12-21 MED ORDER — POTASSIUM CHLORIDE CRYS ER 20 MEQ PO TBCR
60.0000 meq | EXTENDED_RELEASE_TABLET | Freq: Once | ORAL | Status: AC
  Administered 2021-12-21: 60 meq via ORAL
  Filled 2021-12-21: qty 3

## 2021-12-21 MED ORDER — MAGNESIUM SULFATE 4 GM/100ML IV SOLN
4.0000 g | Freq: Once | INTRAVENOUS | Status: AC
  Administered 2021-12-21: 4 g via INTRAVENOUS
  Filled 2021-12-21: qty 100

## 2021-12-21 NOTE — Progress Notes (Signed)
Patient wears CPAP intermittently .Places on and takes off. So it is doubtful if it is doing more than placebo effect. It does pacify patient as he assumes he is air hungry. ?

## 2021-12-21 NOTE — Progress Notes (Signed)
?PROGRESS NOTE ? ? ?Francisco Robinson  CWC:[id number] DOB: 03-12-1961 DOA: 12/18/2021 ?PCP: Carrolyn Meiers, MD  ? ?Chief Complaint  ?Patient presents with  ? Shortness of Breath  ? ?Level of care: Telemetry ? ?Brief Admission History:  ?61 y.o. male,with medical history significant of systolic CHF, COPD, type II DM, tobacco abuse, alcohol abuse, patient with recent hospitalization due to respiratory failure, COPD, CHF, he left AMA, patient presents to ED today with complaints of shortness of breath, patient report when he left AMA, he continued to drink beer 12 packs/day, reports he still smoking, but significantly cut down to 1 cigarette/day, as he presents with dyspnea, cough, wheezing, productive yellow phlegm, he denies fever, chills, chest pain, no nausea or vomiting. In ED patient with diminished air entry, diffuse wheezing, chest x-ray significant for cardiomegaly, but no pneumonia, patient improved with IV Solu-Medrol, and nebulizer treatment, her work-up significant for sodium of 116, AST of 216, ALT of 72, BNP elevated at 2680, platelet count of 149, [facility] hospitalist consulted to admit. ?  ?Assessment and Plan: ?* Hyponatremia ?-- this is chronic ?-- secondary to volume overload and being managed with diuretics ?-- slightly improved ? ?Transaminitis ?-- secondary to chronic alcohol abuse ?-- trending down ? ?Essential hypertension ?-- stable, continue carvedilol as ordered, follow ?-- DC hydralazine as BP trending down  ? ?Tobacco abuse ?-- will offer nicotine patch  ?-- counseled on tobacco cessation  ? ?Acute on chronic combined systolic and diastolic CHF (congestive heart failure) (HCC) ?-- markedly volume overloaded ?-- continue IV lasix as ordered ?-- monitor I/Os, weights, electrolytes  ? ? ?Intake/Output Summary (Last 24 hours) at 12/21/2021 1327 ?Last data filed at 12/21/2021 1250 ?Gross per 24 hour  ?Intake 680 ml  ?Output 675 ml  ?Net 5 ml  ? ?Filed Weights  ? 12/19/21 0220 12/20/21 0500  12/21/21 0440  ?Weight: 60 kg 59.6 kg 64.6 kg  ? ? ? ?COPD exacerbation ([place]) ?-- continue bronchodilators, steroids and supplemental oxygen ?-- continue antibiotics as ordered  ?-- follow clinically  ? ?Cardiomyopathy ([place]) ?-- EF down to 30% in Jan 23  ?-- continue diuresis as ordered  ? ?Diabetes mellitus without complication ([place]) ?-- continue SSI coverage and CBG monitoring  ?CBG (last 3)  ?Recent Labs  ?  12/20/21 ?2125 12/21/21 ?0744 12/21/21 ?1126  ?GLUCAP 170* 160* 176*  ? ? ? ?Alcohol abuse ?-- pt continues to drink heavily  ?-- he remains on CIWA protocol  ? ?Acute respiratory failure with hypoxia ([place]) ?-- improving with supportive treatments  ?-- nightly cpap ordered ?-- transfer out of stepdown to floor today ? ?Hepatic cirrhosis ([place]) ?-- continue to counsel on alcohol cessation ?-- diuretics as ordered  ?-- [place] ascites: not enough fluid to have a benefit from paracentesis ? ?DVT prophylaxis: rivaroxaban (refuses heparin shots) ?Code Status: full  ?Family Communication:  ?Disposition: Status is: Inpatient ?Remains inpatient appropriate because: IV diuresis required  ?  ?Consultants:  ? ?Procedures:  ?[place] ascites 5/12  ?Antimicrobials:  ?  ?Subjective: ?Pt reports that he wants a shirt and clothes to wear.  Denies CP and SOB.     ?Objective: ?Vitals:  ? 12/21/21 0900 12/21/21 1000 12/21/21 1200 12/21/21 1258  ?BP: 124/89 119/86 (!) 123/92 (!) 123/91  ?Pulse:  (!) 103 (!) 109 (!) 106  ?Resp: (!) 28 (!) 29 (!) 28 18  ?Temp:      ?TempSrc:      ?SpO2:  100% 100% 100%  ?Weight:      ?  Height:      ? ? ?Intake/Output Summary (Last 24 hours) at 12/21/2021 1327 ?Last data filed at 12/21/2021 1250 ?Gross per 24 hour  ?Intake 680 ml  ?Output 675 ml  ?Net 5 ml  ? ?Filed Weights  ? 12/19/21 0220 12/20/21 0500 12/21/21 0440  ?Weight: 60 kg 59.6 kg 64.6 kg  ? ?Examination: ? ?General exam: chronically ill appearing, Appears calm and comfortable  ?Respiratory system: improving bibasilar crackles heard.  No increased work  of breathing. ?Cardiovascular system: normal S1 & S2 heard. No JVD, murmurs, rubs, gallops or clicks. No pedal edema. ?Gastrointestinal system: Abdomen is nondistended, soft and nontender. No organomegaly or masses felt. Normal bowel sounds heard. ?Central nervous system: Alert and oriented. No focal neurological deficits. ?Extremities: trace pretibial edema BLEs.  Symmetric 5 x 5 power. ?Skin: No rashes, lesions or ulcers. ?Psychiatry: Judgement and insight appear poor. Mood & affect appropriate.  ? ?Data Reviewed: I have personally reviewed following labs and imaging studies ? ?CBC: ?Recent Labs  ?Lab 12/15/21 ?0423 12/18/21 ?1908 12/19/21 ?0401 12/20/21 ?2263  ?WBC 11.0* 8.9 6.4 18.4*  ?NEUTROABS  --  6.0  --   --   ?HGB 11.5* 12.5* 13.1 11.8*  ?HCT 34.2* 35.7* 37.3* 35.1*  ?MCV 98.6 94.7 94.4 97.5  ?PLT 157 149* 164 168  ? ? ?Basic Metabolic Panel: ?Recent Labs  ?Lab 12/15/21 ?0423 12/18/21 ?1908 12/18/21 ?2352 12/19/21 ?0401 12/19/21 ?1205 12/19/21 ?1736 12/19/21 ?3354 12/20/21 ?5625 12/21/21 ?0337  ?NA 128* 116*   < > 125* 126* 131* 127* 131* 133*  ?K 3.5 4.2  --  3.9  --   --   --  3.7 3.3*  ?CL 91* 84*  --  91*  --   --   --  97* 98  ?CO2 27 20*  --  21*  --   --   --  28 28  ?GLUCOSE 166* 96  --  137*  --   --   --  159* 160*  ?BUN 20 14  --  14  --   --   --  20 19  ?CREATININE 0.75 0.75  --  0.58*  --   --   --  0.65 0.58*  ?CALCIUM 8.5* 7.7*  --  8.3*  --   --   --  8.5* 8.1*  ?MG 1.5*  --   --   --   --   --   --  1.8 1.6*  ? < > = values in this interval not displayed.  ? ? ?CBG: ?Recent Labs  ?Lab 12/20/21 ?1132 12/20/21 ?1638 12/20/21 ?2125 12/21/21 ?6389 12/21/21 ?1126  ?GLUCAP 142* 281* 170* 160* 176*  ? ? ?Recent Results (from the past 240 hour(s))  ?Group A Strep by PCR     Status: None  ? Collection Time: 12/13/21  1:49 AM  ? Specimen: Throat; Sterile Swab  ?Result Value Ref Range Status  ? Group A Strep by PCR NOT DETECTED NOT DETECTED Final  ?  Comment: Performed at [facility], [address], [place], [place] [address]  ?Resp Panel by RT-PCR (Flu A&B, Covid) Nasopharyngeal Swab     Status: None  ? Collection Time: 12/13/21  8:59 AM  ? Specimen: Nasopharyngeal Swab; Nasopharyngeal(NP) swabs in vial transport medium  ?Result Value Ref Range Status  ? SARS Coronavirus 2 by RT PCR NEGATIVE NEGATIVE Final  ?  Comment: (NOTE) ?SARS-CoV-2 target nucleic acids are NOT DETECTED. ? ?The SARS-CoV-2 RNA is generally detectable  in upper respiratory ?specimens during the acute phase of infection. The lowest ?concentration of SARS-CoV-2 viral copies this assay can detect is ?138 copies/mL. A negative result does not preclude SARS-Cov-2 ?infection and should not be used as the sole basis for treatment or ?other patient management decisions. A negative result may occur with  ?improper specimen collection/handling, submission of specimen other ?than nasopharyngeal swab, presence of viral mutation(s) within the ?areas targeted by this assay, and inadequate number of viral ?copies(<138 copies/mL). A negative result must be combined with ?clinical observations, patient history, and epidemiological ?information. The expected result is Negative. ? ?Fact Sheet for Patients:  ?EntrepreneurPulse.com.au ? ?Fact Sheet for Healthcare Providers:  ?IncredibleEmployment.be ? ?This test is no t yet approved or cleared by the [place] FDA and  ?has been authorized for detection and/or diagnosis of SARS-CoV-2 by ?FDA under an Emergency Use Authorization (EUA). This EUA will remain  ?in effect (meaning this test can be used) for the duration of the ?COVID-19 declaration under Section 564(b)(1) of the Act, 21 ?U.S.C.section 360bbb-3(b)(1), unless the authorization is terminated  ?or revoked sooner.  ? ? ?  ? Influenza A by PCR NEGATIVE NEGATIVE Final  ? Influenza B by PCR NEGATIVE NEGATIVE Final  ?  Comment: (NOTE) ?The Xpert Xpress SARS-CoV-2/FLU/RSV plus assay is intended as an aid ?in the  diagnosis of influenza from Nasopharyngeal swab specimens and ?should not be used as a sole basis for treatment. Nasal washings and ?aspirates are unacceptable for Xpert Xpress SARS-CoV-2/FLU/RSV ?testing. ? ?

## 2021-12-22 LAB — BASIC METABOLIC PANEL
Anion gap: 5 (ref 5–15)
BUN: 17 mg/dL (ref 6–20)
CO2: 27 mmol/L (ref 22–32)
Calcium: 8.2 mg/dL — ABNORMAL LOW (ref 8.9–10.3)
Chloride: 98 mmol/L (ref 98–111)
Creatinine, Ser: 0.53 mg/dL — ABNORMAL LOW (ref 0.61–1.24)
GFR, Estimated: >60 mL/min (ref >60)
Glucose, Bld: 127 mg/dL — ABNORMAL HIGH (ref 70–99)
Potassium: 3.8 mmol/L (ref 3.5–5.1)
Sodium: 130 mmol/L — ABNORMAL LOW (ref 135–145)

## 2021-12-22 LAB — BRAIN NATRIURETIC PEPTIDE: B Natriuretic Peptide: >4500 pg/mL — ABNORMAL HIGH (ref 0.0–100.0)

## 2021-12-22 LAB — GLUCOSE, CAPILLARY
Glucose-Capillary: 114 mg/dL — ABNORMAL HIGH (ref 70–99)
Glucose-Capillary: 152 mg/dL — ABNORMAL HIGH (ref 70–99)

## 2021-12-22 LAB — VITAMIN D 25 HYDROXY (VIT D DEFICIENCY, FRACTURES): Vit D, 25-Hydroxy: 18.1 ng/mL — ABNORMAL LOW (ref 30–100)

## 2021-12-22 LAB — MAGNESIUM: Magnesium: 2 mg/dL (ref 1.7–2.4)

## 2021-12-22 MED ORDER — FUROSEMIDE 20 MG PO TABS: 20.0000 mg | ORAL_TABLET | Freq: Every day | ORAL | Status: DC

## 2021-12-22 MED ORDER — FOLIC ACID 1 MG PO TABS: 1.0000 mg | ORAL_TABLET | Freq: Every day | ORAL | 1 refills | Status: AC

## 2021-12-22 MED ORDER — PREDNISONE 20 MG PO TABS: 40.0000 mg | ORAL_TABLET | Freq: Every day | ORAL | 0 refills | Status: AC

## 2021-12-22 MED ORDER — POTASSIUM CHLORIDE CRYS ER 20 MEQ PO TBCR
40.0000 meq | EXTENDED_RELEASE_TABLET | Freq: Once | ORAL | Status: AC
  Administered 2021-12-22: 40 meq via ORAL
  Filled 2021-12-22: qty 2

## 2021-12-22 MED ORDER — DOXYCYCLINE HYCLATE 100 MG PO TABS: 100.0000 mg | ORAL_TABLET | Freq: Two times a day (BID) | ORAL | 0 refills | Status: AC

## 2021-12-22 MED ORDER — LORAZEPAM 1 MG PO TABS
1.0000 mg | ORAL_TABLET | ORAL | Status: DC | PRN
  Filled 2021-12-22: qty 1

## 2021-12-22 MED ORDER — FUROSEMIDE 20 MG PO TABS: 20.0000 mg | ORAL_TABLET | Freq: Every day | ORAL | 1 refills | Status: AC

## 2021-12-22 MED ORDER — ATORVASTATIN CALCIUM 40 MG PO TABS: 40.0000 mg | ORAL_TABLET | Freq: Every day | ORAL | 0 refills | Status: AC

## 2021-12-22 MED ORDER — LORAZEPAM 2 MG/ML IJ SOLN
1.0000 mg | INTRAMUSCULAR | Status: DC | PRN
  Administered 2021-12-22: 1 mg via INTRAVENOUS
  Filled 2021-12-22: qty 1

## 2021-12-22 NOTE — Progress Notes (Signed)
Pt discharged ambulatory w/ friend per pt request to home. Belongings in his possession, discharge information reviewed with pt. ?

## 2021-12-22 NOTE — Progress Notes (Signed)
?   12/22/21 1127  ?ReDS Vest / Clip  ?Station Marker C  ?Ruler Value 31  ?ReDS Value Range < 36  ?ReDS Actual Value 33  ? ?Dr Wynetta Emery notified.  ?

## 2021-12-22 NOTE — Discharge Instructions (Signed)
IMPORTANT INFORMATION: PAY CLOSE ATTENTION   PHYSICIAN DISCHARGE INSTRUCTIONS  Follow with Primary care provider  Fanta, Tesfaye Demissie, MD  and other consultants as instructed by your Hospitalist Physician  SEEK MEDICAL CARE OR RETURN TO EMERGENCY ROOM IF SYMPTOMS COME BACK, WORSEN OR NEW PROBLEM DEVELOPS   Please note: You were cared for by a hospitalist during your hospital stay. Every effort will be made to forward records to your primary care provider.  You can request that your primary care provider send for your hospital records if they have not received them.  Once you are discharged, your primary care physician will handle any further medical issues. Please note that NO REFILLS for any discharge medications will be authorized once you are discharged, as it is imperative that you return to your primary care physician (or establish a relationship with a primary care physician if you do not have one) for your post hospital discharge needs so that they can reassess your need for medications and monitor your lab values.  Please get a complete blood count and chemistry panel checked by your Primary MD at your next visit, and again as instructed by your Primary MD.  Get Medicines reviewed and adjusted: Please take all your medications with you for your next visit with your Primary MD  Laboratory/radiological data: Please request your Primary MD to go over all hospital tests and procedure/radiological results at the follow up, please ask your primary care provider to get all Hospital records sent to his/her office.  In some cases, they will be blood work, cultures and biopsy results pending at the time of your discharge. Please request that your primary care provider follow up on these results.  If you are diabetic, please bring your blood sugar readings with you to your follow up appointment with primary care.    Please call and make your follow up appointments as soon as possible.     Also Note the following: If you experience worsening of your admission symptoms, develop shortness of breath, life threatening emergency, suicidal or homicidal thoughts you must seek medical attention immediately by calling 911 or calling your MD immediately  if symptoms less severe.  You must read complete instructions/literature along with all the possible adverse reactions/side effects for all the Medicines you take and that have been prescribed to you. Take any new Medicines after you have completely understood and accpet all the possible adverse reactions/side effects.   Do not drive when taking Pain medications or sleeping medications (Benzodiazepines)  Do not take more than prescribed Pain, Sleep and Anxiety Medications. It is not advisable to combine anxiety,sleep and pain medications without talking with your primary care practitioner  Special Instructions: If you have smoked or chewed Tobacco  in the last 2 yrs please stop smoking, stop any regular Alcohol  and or any Recreational drug use.  Wear Seat belts while driving.  Do not drive if taking any narcotic, mind altering or controlled substances or recreational drugs or alcohol.       

## 2021-12-22 NOTE — Progress Notes (Signed)
Ng Discharge Note ? ?Admit Date:  12/18/2021 ?Discharge date: 12/22/2021 ?  ?[name] to be D/C'd Home per MD order.  AVS completed. ?Patient/caregiver able to verbalize understanding. ? ?Discharge Medication: ?Allergies as of 12/22/2021   ?No Known Allergies ?  ? ?  ?Medication List  ?  ? ?STOP taking these medications   ? ?hydrALAZINE 25 MG tablet ?Commonly known as: APRESOLINE ?  ?hydrocortisone 25 MG suppository ?Commonly known as: ANUSOL-HC ?  ?losartan 25 MG tablet ?Commonly known as: COZAAR ?  ?nicotine 21 mg/24hr patch ?Commonly known as: NICODERM CQ - dosed in mg/24 hours ?  ? ?  ? ?TAKE these medications   ? ?albuterol 108 (90 Base) MCG/ACT inhaler ?Commonly known as: VENTOLIN HFA ?Inhale 2 puffs into the lungs every 6 (six) hours as needed for wheezing or shortness of breath. ?  ?aspirin 81 MG EC tablet ?Take 1 tablet (81 mg total) by mouth daily with breakfast. Swallow whole. ?  ?atorvastatin 40 MG tablet ?Commonly known as: LIPITOR ?Take 1 tablet (40 mg total) by mouth daily. ?  ?carvedilol 3.125 MG tablet ?Commonly known as: COREG ?Take 1 tablet (3.125 mg total) by mouth 2 (two) times daily with a meal. ?  ?dextromethorphan-guaiFENesin 30-600 MG 12hr tablet ?Commonly known as: [place] DM ?Take 1 tablet by mouth 2 (two) times daily. ?  ?doxycycline 100 MG tablet ?Commonly known as: VIBRA-TABS ?Take 1 tablet (100 mg total) by mouth every 12 (twelve) hours for 3 days. ?  ?folic acid 1 MG tablet ?Commonly known as: FOLVITE ?Take 1 tablet (1 mg total) by mouth daily. ?  ?furosemide 20 MG tablet ?Commonly known as: LASIX ?Take 1 tablet (20 mg total) by mouth daily. ?  ?isosorbide mononitrate 30 MG 24 hr tablet ?Commonly known as: IMDUR ?Take 0.5 tablets (15 mg total) by mouth daily. ?  ?multivitamin with minerals Tabs tablet ?Take 1 tablet by mouth daily. ?  ?predniSONE 20 MG tablet ?Commonly known as: DELTASONE ?Take 2 tablets (40 mg total) by mouth daily with breakfast for 3 days. ?Start taking on:  Dec 23, 2021 ?  ?Symbicort 160-4.5 MCG/ACT inhaler ?Generic drug: budesonide-formoterol ?Inhale 1 puff into the lungs 2 (two) times daily. ?  ?thiamine 100 MG tablet ?Take 1 tablet (100 mg total) by mouth daily. ?  ? ?  ? ? ?Discharge Assessment: ?Vitals:  ? 12/22/21 0503 12/22/21 0704  ?BP: (!) 110/91   ?Pulse: (!) 107   ?Resp: 18   ?Temp: 97.6 ?F (36.4 ?C)   ?SpO2: 100% 99%  ? Skin clean, dry and intact without evidence of skin break down, no evidence of skin tears noted. ?IV catheter discontinued intact. Site without signs and symptoms of complications - no redness or edema noted at insertion site, patient denies c/o pain - only slight tenderness at site.  Dressing with slight pressure applied. ? ?D/c Instructions-Education: ?Discharge instructions given to patient/family with verbalized understanding. ?D/c education completed with patient/family including follow up instructions, medication list, d/c activities limitations if indicated, with other d/c instructions as indicated by MD - patient able to verbalize understanding, all questions fully answered. ?Patient instructed to return to ED, call 911, or call MD for any changes in condition.  ?Patient escorted via [place], and D/C home via private auto. ? ?Tsosie Billing, LPN ?1/76/1607 3:71 PM   ?

## 2021-12-22 NOTE — Progress Notes (Signed)
?PROGRESS NOTE ? ? ?Francisco Robinson  WIO:[id number] DOB: 1961-02-05 DOA: 12/18/2021 ?PCP: Carrolyn Meiers, MD  ? ?Chief Complaint  ?Patient presents with  ? Shortness of Breath  ? ?Level of care: Telemetry ? ?Brief Admission History:  ?61 y.o. male,with medical history significant of systolic CHF, COPD, type II DM, tobacco abuse, alcohol abuse, patient with recent hospitalization due to respiratory failure, COPD, CHF, he left AMA, patient presents to ED today with complaints of shortness of breath, patient report when he left AMA, he continued to drink beer 12 packs/day, reports he still smoking, but significantly cut down to 1 cigarette/day, as he presents with dyspnea, cough, wheezing, productive yellow phlegm, he denies fever, chills, chest pain, no nausea or vomiting. In ED patient with diminished air entry, diffuse wheezing, chest x-ray significant for cardiomegaly, but no pneumonia, patient improved with IV Solu-Medrol, and nebulizer treatment, her work-up significant for sodium of 116, AST of 216, ALT of 72, BNP elevated at 2680, platelet count of 149, [facility] hospitalist consulted to admit. ?  ?Assessment and Plan: ?* Hyponatremia ?-- this is chronic ?-- secondary to volume overload and being managed with diuretics ?-- slightly improved ? ?Transaminitis ?-- secondary to chronic alcohol abuse ?-- trending down ? ?Essential hypertension ?-- stable, continue carvedilol as ordered, follow ?-- DC hydralazine as BP trending down  ? ?Tobacco abuse ?-- will offer nicotine patch  ?-- counseled on tobacco cessation  ? ?Acute on chronic combined systolic and diastolic CHF (congestive heart failure) (HCC) ?-- markedly volume overloaded ?-- continue IV lasix as ordered ?-- monitor I/Os, weights, electrolytes  ?-- requested REdSclips Protocol  ? ?Intake/Output Summary (Last 24 hours) at 12/22/2021 1122 ?Last data filed at 12/22/2021 0900 ?Gross per 24 hour  ?Intake 1300 ml  ?Output 1500 ml  ?Net -200 ml  ? ?Filed  Weights  ? 12/20/21 0500 12/21/21 0440 12/22/21 0551  ?Weight: 59.6 kg 64.6 kg 61.1 kg  ? ? ? ?COPD exacerbation ([place]) ?-- continue bronchodilators, steroids and supplemental oxygen ?-- continue antibiotics as ordered  ?-- follow clinically  ? ?Cardiomyopathy ([place]) ?-- EF down to 30% in Jan 23  ?-- continue diuresis as ordered  ? ?Diabetes mellitus without complication ([place]) ?-- continue SSI coverage and CBG monitoring  ?CBG (last 3)  ?Recent Labs  ?  12/21/21 ?2106 12/22/21 ?0724 12/22/21 ?1113  ?GLUCAP 199* 114* 152*  ? ? ? ?Alcohol abuse ?-- pt continues to drink heavily  ?-- he remains on CIWA protocol  ?-- IV lorazepam is being used to calm the cravings ? ?Acute respiratory failure with hypoxia ([place]) ?-- improving with supportive treatments  ?-- nightly cpap ordered ?-- he is now in telemetry ?-- Redclips ordered. ? ?Hepatic cirrhosis ([place]) ?-- continue to counsel on alcohol cessation ?-- diuretics as ordered  ?-- [place] ascites: not enough fluid to have a benefit from paracentesis ? ?DVT prophylaxis: rivaroxaban (refuses heparin shots) ?Code Status: full  ?Family Communication:  ?Disposition: Status is: Inpatient ?Remains inpatient appropriate because: IV diuresis required  ?  ?Consultants:  ? ?Procedures:  ?[place] ascites 5/12  ?Antimicrobials:  ?  ?Subjective: ?Pt reporting that he needs some lorazepam.  It calms the severe alcohol cravings.     ?Objective: ?Vitals:  ? 12/21/21 2106 12/22/21 0503 12/22/21 0551 12/22/21 0704  ?BP: 109/83 (!) 110/91    ?Pulse: (!) 106 (!) 107    ?Resp: 18 18    ?Temp: 98 ?F (36.7 ?C) 97.6 ?F (36.4 ?C)    ?TempSrc: Oral Oral    ?  SpO2: 100% 100%  99%  ?Weight:   61.1 kg   ?Height:      ? ? ?Intake/Output Summary (Last 24 hours) at 12/22/2021 1125 ?Last data filed at 12/22/2021 0900 ?Gross per 24 hour  ?Intake 1300 ml  ?Output 1500 ml  ?Net -200 ml  ? ?Filed Weights  ? 12/20/21 0500 12/21/21 0440 12/22/21 0551  ?Weight: 59.6 kg 64.6 kg 61.1 kg  ? ?Examination: ? ?General exam: chronically  ill appearing, Appears calm and comfortable  ?Respiratory system: improving bibasilar crackles heard.  No increased work of breathing. ?Cardiovascular system: normal S1 & S2 heard. No JVD, murmurs, rubs, gallops or clicks. No pedal edema. ?Gastrointestinal system: Abdomen is nondistended, soft and nontender. No organomegaly or masses felt. Normal bowel sounds heard. ?Central nervous system: Alert and oriented. No focal neurological deficits. ?Extremities: trace pretibial edema BLEs.  Symmetric 5 x 5 power. ?Skin: No rashes, lesions or ulcers. ?Psychiatry: Judgement and insight appear poor. Mood & affect appropriate.  ? ?Data Reviewed: I have personally reviewed following labs and imaging studies ? ?CBC: ?Recent Labs  ?Lab 12/18/21 ?1908 12/19/21 ?0401 12/20/21 ?0705  ?WBC 8.9 6.4 18.4*  ?NEUTROABS 6.0  --   --   ?HGB 12.5* 13.1 11.8*  ?HCT 35.7* 37.3* 35.1*  ?MCV 94.7 94.4 97.5  ?PLT 149* 164 168  ? ? ?Basic Metabolic Panel: ?Recent Labs  ?Lab 12/18/21 ?1908 12/18/21 ?2352 12/19/21 ?0401 12/19/21 ?1205 12/19/21 ?1736 12/19/21 ?8563 12/20/21 ?1497 12/21/21 ?0263 12/22/21 ?0421  ?NA 116*   < > 125*   < > 131* 127* 131* 133* 130*  ?K 4.2  --  3.9  --   --   --  3.7 3.3* 3.8  ?CL 84*  --  91*  --   --   --  97* 98 98  ?CO2 20*  --  21*  --   --   --  '28 28 27  '$ ?GLUCOSE 96  --  137*  --   --   --  159* 160* 127*  ?BUN 14  --  14  --   --   --  '20 19 17  '$ ?CREATININE 0.75  --  0.58*  --   --   --  0.65 0.58* 0.53*  ?CALCIUM 7.7*  --  8.3*  --   --   --  8.5* 8.1* 8.2*  ?MG  --   --   --   --   --   --  1.8 1.6* 2.0  ? < > = values in this interval not displayed.  ? ? ?CBG: ?Recent Labs  ?Lab 12/21/21 ?1126 12/21/21 ?1625 12/21/21 ?2106 12/22/21 ?0724 12/22/21 ?1113  ?GLUCAP 176* 228* 199* 114* 152*  ? ? ?Recent Results (from the past 240 hour(s))  ?Group A Strep by PCR     Status: None  ? Collection Time: 12/13/21  1:49 AM  ? Specimen: Throat; Sterile Swab  ?Result Value Ref Range Status  ? Group A Strep by PCR NOT  DETECTED NOT DETECTED Final  ?  Comment: Performed at [facility], [address] [place], [place] [address]  ?Resp Panel by RT-PCR (Flu A&B, Covid) Nasopharyngeal Swab     Status: None  ? Collection Time: 12/13/21  8:59 AM  ? Specimen: Nasopharyngeal Swab; Nasopharyngeal(NP) swabs in vial transport medium  ?Result Value Ref Range Status  ? SARS Coronavirus 2 by RT PCR NEGATIVE NEGATIVE Final  ?  Comment: (NOTE) ?SARS-CoV-2 target nucleic acids are NOT DETECTED. ? ?  The SARS-CoV-2 RNA is generally detectable in upper respiratory ?specimens during the acute phase of infection. The lowest ?concentration of SARS-CoV-2 viral copies this assay can detect is ?138 copies/mL. A negative result does not preclude SARS-Cov-2 ?infection and should not be used as the sole basis for treatment or ?other patient management decisions. A negative result may occur with  ?improper specimen collection/handling, submission of specimen other ?than nasopharyngeal swab, presence of viral mutation(s) within the ?areas targeted by this assay, and inadequate number of viral ?copies(<138 copies/mL). A negative result must be combined with ?clinical observations, patient history, and epidemiological ?information. The expected result is Negative. ? ?Fact Sheet for Patients:  ?EntrepreneurPulse.com.au ? ?Fact Sheet for Healthcare Providers:  ?IncredibleEmployment.be ? ?This test is no t yet approved or cleared by the [place] FDA and  ?has been authorized for detection and/or diagnosis of SARS-CoV-2 by ?FDA under an Emergency Use Authorization (EUA). This EUA will remain  ?in effect (meaning this test can be used) for the duration of the ?COVID-19 declaration under Section 564(b)(1) of the Act, 21 ?U.S.C.section 360bbb-3(b)(1), unless the authorization is terminated  ?or revoked sooner.  ? ? ?  ? Influenza A by PCR NEGATIVE NEGATIVE Final  ? Influenza B by PCR NEGATIVE NEGATIVE Final  ?  Comment: (NOTE) ?The  Xpert Xpress SARS-CoV-2/FLU/RSV plus assay is intended as an aid ?in the diagnosis of influenza from Nasopharyngeal swab specimens and ?should not be used as a sole basis for treatment. Nasal washings and ?aspir

## 2021-12-22 NOTE — Progress Notes (Signed)
12/22/2021 ?1:35 PM ? ?Pt says he is leaving today.  He has something going on at home and called a ride.  I decided because his RedSclip was reassuring.  I will discharge home him now and not have him sign out AMA. See DC summary.  ? ?C. Wynetta Emery, MD  ?

## 2021-12-22 NOTE — Progress Notes (Signed)
OT Cancellation Note ? ?Patient Details ?Name: Francisco Robinson ?MRN: [id number] ?DOB: 09/13/1960 ? ? ?Cancelled Treatment:    Reason Eval/Treat Not Completed: OT screened, no needs identified, will sign off. Pt performing ADLs and mobility independently. No further OT needs at this time.  ? ? ?Guadelupe Sabin, OTR/L  ?[phone] ?12/22/2021, 7:50 AM ?

## 2021-12-22 NOTE — Discharge Summary (Signed)
Physician Discharge Summary  ?Francisco Robinson DOB: Jan 06, 1961 DOA: 12/18/2021 ? ?PCP: Carrolyn Meiers, MD ? ?Admit date: 12/18/2021 ?Discharge date: 12/22/2021 ? ?Admitted From:  HOME  ?Disposition:  HOME  ? ?Pt says he is leaving today.  He has something going on at home and called a ride.  I decided because his RedSclip was reassuring today at 91.  I will discharge home him now and not have him sign out AMA. See DC summary.  ?  ?C. Wynetta Emery, MD  ? ? ?Recommendations for Outpatient Follow-up:  ?Follow up with PCP in 1 weeks ?AVOID ALL ALCOHOL  ? ?Discharge Condition: Stable   ?CODE STATUS: Full ?DIET: 2 gram sodium recommended   ? ?Brief Hospitalization Summary: ?Please see all hospital notes, images, labs for full details of the hospitalization. ?61 y.o. male,with medical history significant of systolic CHF, COPD, type II DM, tobacco abuse, alcohol abuse, patient with recent hospitalization due to respiratory failure, COPD, CHF, he left AMA, patient presents to ED today with complaints of shortness of breath, patient report when he left AMA, he continued to drink beer 12 packs/day, reports he still smoking, but significantly cut down to 1 cigarette/day, as he presents with dyspnea, cough, wheezing, productive yellow phlegm, he denies fever, chills, chest pain, no nausea or vomiting. In ED patient with diminished air entry, diffuse wheezing, chest x-ray significant for cardiomegaly, but no pneumonia, patient improved with IV Solu-Medrol, and nebulizer treatment, her work-up significant for sodium of 116, AST of 216, ALT of 72, BNP elevated at 2680, platelet count of 149, [facility] hospitalist consulted to admit. ? ?Hospital Course by Problem list ? ?Assessment and Plan: ?* Hyponatremia ?-- this is chronic ?-- secondary to volume overload and being managed with diuretics ?-- slightly improved ? ?Transaminitis ?-- secondary to chronic alcohol abuse ?-- trending down ? ?Essential hypertension ?-- stable,  continue carvedilol as ordered, follow ?-- DC hydralazine as BP trending down  ? ?Tobacco abuse ?-- will offer nicotine patch  ?-- counseled on tobacco cessation  ? ?Acute on chronic combined systolic and diastolic CHF (congestive heart failure) (HCC) ?-- markedly volume overloaded ?-- continue IV lasix as ordered ?-- monitor I/Os, weights, electrolytes  ?-- requested REdSclips Protocol  ? ?Intake/Output Summary (Last 24 hours) at 12/22/2021 1122 ?Last data filed at 12/22/2021 0900 ?Gross per 24 hour  ?Intake 1300 ml  ?Output 1500 ml  ?Net -200 ml  ? ?Filed Weights  ? 12/20/21 0500 12/21/21 0440 12/22/21 0551  ?Weight: 59.6 kg 64.6 kg 61.1 kg  ? ? ? ?COPD exacerbation ([place]) ?-- continue bronchodilators, steroids and supplemental oxygen ?-- continue antibiotics as ordered  ?-- follow clinically  ? ?Cardiomyopathy ([place]) ?-- EF down to 30% in Jan 23  ?-- continue diuresis as ordered  ? ?Diabetes mellitus without complication ([place]) ?-- continue SSI coverage and CBG monitoring  ?CBG (last 3)  ?Recent Labs  ?  12/21/21 ?2106 12/22/21 ?0724 12/22/21 ?1113  ?GLUCAP 199* 114* 152*  ? ? ? ?Alcohol abuse ?-- pt continues to drink heavily  ?-- he remains on CIWA protocol  ?-- IV lorazepam is being used to calm the cravings ? ?Acute respiratory failure with hypoxia ([place]) ?-- improving with supportive treatments  ?-- nightly cpap ordered ?-- he is now in telemetry ?-- Redclips ordered. ? ?Hepatic cirrhosis ([place]) ?-- continue to counsel on alcohol cessation ?-- diuretics as ordered  ?-- [place] ascites: not enough fluid to have a benefit from paracentesis ? ? ?Discharge Diagnoses:  ?Principal  Problem: ?  Hyponatremia ?Active Problems: ?  Hepatic cirrhosis ([place]) ?  Acute respiratory failure with hypoxia ([place]) ?  Alcohol abuse ?  Diabetes mellitus without complication ([place]) ?  Cardiomyopathy ([place]) ?  COPD exacerbation ([place]) ?  Acute on chronic combined systolic and diastolic CHF (congestive heart failure) ([place]) ?  Tobacco abuse ?   Essential hypertension ?  Transaminitis ? ? ?Discharge Instructions: ? ?Allergies as of 12/22/2021   ?No Known Allergies ?  ? ?  ?Medication List  ?  ? ?STOP taking these medications   ? ?hydrALAZINE 25 MG tablet ?Commonly known as: APRESOLINE ?  ?hydrocortisone 25 MG suppository ?Commonly known as: ANUSOL-HC ?  ?losartan 25 MG tablet ?Commonly known as: COZAAR ?  ?nicotine 21 mg/24hr patch ?Commonly known as: NICODERM CQ - dosed in mg/24 hours ?  ? ?  ? ?TAKE these medications   ? ?albuterol 108 (90 Base) MCG/ACT inhaler ?Commonly known as: VENTOLIN HFA ?Inhale 2 puffs into the lungs every 6 (six) hours as needed for wheezing or shortness of breath. ?  ?aspirin 81 MG EC tablet ?Take 1 tablet (81 mg total) by mouth daily with breakfast. Swallow whole. ?  ?atorvastatin 40 MG tablet ?Commonly known as: LIPITOR ?Take 1 tablet (40 mg total) by mouth daily. ?  ?carvedilol 3.125 MG tablet ?Commonly known as: COREG ?Take 1 tablet (3.125 mg total) by mouth 2 (two) times daily with a meal. ?  ?dextromethorphan-guaiFENesin 30-600 MG 12hr tablet ?Commonly known as: [place] DM ?Take 1 tablet by mouth 2 (two) times daily. ?  ?doxycycline 100 MG tablet ?Commonly known as: VIBRA-TABS ?Take 1 tablet (100 mg total) by mouth every 12 (twelve) hours for 3 days. ?  ?folic acid 1 MG tablet ?Commonly known as: FOLVITE ?Take 1 tablet (1 mg total) by mouth daily. ?  ?furosemide 20 MG tablet ?Commonly known as: LASIX ?Take 1 tablet (20 mg total) by mouth daily. ?  ?isosorbide mononitrate 30 MG 24 hr tablet ?Commonly known as: IMDUR ?Take 0.5 tablets (15 mg total) by mouth daily. ?  ?multivitamin with minerals Tabs tablet ?Take 1 tablet by mouth daily. ?  ?predniSONE 20 MG tablet ?Commonly known as: DELTASONE ?Take 2 tablets (40 mg total) by mouth daily with breakfast for 3 days. ?Start taking on: Dec 23, 2021 ?  ?Symbicort 160-4.5 MCG/ACT inhaler ?Generic drug: budesonide-formoterol ?Inhale 1 puff into the lungs 2 (two) times daily. ?   ?thiamine 100 MG tablet ?Take 1 tablet (100 mg total) by mouth daily. ?  ? ?  ? ? Follow-up Information   ? ? Carrolyn Meiers, MD. Schedule an appointment as soon as possible for a visit in 1 week(s).   ?Specialty: Internal Medicine ?Why: Hospital Follow Up ?Contact information: ?[address] ?[place] [address] ?[phone] ? ? ?  ?  ? ?  ?  ? ?  ? ?No Known Allergies ?Allergies as of 12/22/2021   ?No Known Allergies ?  ? ?  ?Medication List  ?  ? ?STOP taking these medications   ? ?hydrALAZINE 25 MG tablet ?Commonly known as: APRESOLINE ?  ?hydrocortisone 25 MG suppository ?Commonly known as: ANUSOL-HC ?  ?losartan 25 MG tablet ?Commonly known as: COZAAR ?  ?nicotine 21 mg/24hr patch ?Commonly known as: NICODERM CQ - dosed in mg/24 hours ?  ? ?  ? ?TAKE these medications   ? ?albuterol 108 (90 Base) MCG/ACT inhaler ?Commonly known as: VENTOLIN HFA ?Inhale 2 puffs into the lungs every 6 (  six) hours as needed for wheezing or shortness of breath. ?  ?aspirin 81 MG EC tablet ?Take 1 tablet (81 mg total) by mouth daily with breakfast. Swallow whole. ?  ?atorvastatin 40 MG tablet ?Commonly known as: LIPITOR ?Take 1 tablet (40 mg total) by mouth daily. ?  ?carvedilol 3.125 MG tablet ?Commonly known as: COREG ?Take 1 tablet (3.125 mg total) by mouth 2 (two) times daily with a meal. ?  ?dextromethorphan-guaiFENesin 30-600 MG 12hr tablet ?Commonly known as: [place] DM ?Take 1 tablet by mouth 2 (two) times daily. ?  ?doxycycline 100 MG tablet ?Commonly known as: VIBRA-TABS ?Take 1 tablet (100 mg total) by mouth every 12 (twelve) hours for 3 days. ?  ?folic acid 1 MG tablet ?Commonly known as: FOLVITE ?Take 1 tablet (1 mg total) by mouth daily. ?  ?furosemide 20 MG tablet ?Commonly known as: LASIX ?Take 1 tablet (20 mg total) by mouth daily. ?  ?isosorbide mononitrate 30 MG 24 hr tablet ?Commonly known as: IMDUR ?Take 0.5 tablets (15 mg total) by mouth daily. ?  ?multivitamin with minerals Tabs  tablet ?Take 1 tablet by mouth daily. ?  ?predniSONE 20 MG tablet ?Commonly known as: DELTASONE ?Take 2 tablets (40 mg total) by mouth daily with breakfast for 3 days. ?Start taking on: Dec 23, 2021 ?  ?Symbicort 160-4.5 M

## 2021-12-26 ENCOUNTER — Emergency Department (HOSPITAL_COMMUNITY): Payer: Medicaid Other

## 2021-12-26 ENCOUNTER — Encounter (HOSPITAL_COMMUNITY): Payer: Self-pay

## 2021-12-26 ENCOUNTER — Other Ambulatory Visit: Payer: Self-pay

## 2021-12-26 ENCOUNTER — Inpatient Hospital Stay (HOSPITAL_COMMUNITY)
Admission: EM | Admit: 2021-12-26 | Discharge: 2022-01-08 | DRG: 208 | Disposition: E | Payer: Medicaid Other | Attending: Internal Medicine | Admitting: Internal Medicine

## 2021-12-26 DIAGNOSIS — E782 Mixed hyperlipidemia: Secondary | ICD-10-CM | POA: Diagnosis present

## 2021-12-26 DIAGNOSIS — E871 Hypo-osmolality and hyponatremia: Secondary | ICD-10-CM | POA: Diagnosis present

## 2021-12-26 DIAGNOSIS — R001 Bradycardia, unspecified: Secondary | ICD-10-CM | POA: Diagnosis not present

## 2021-12-26 DIAGNOSIS — I10 Essential (primary) hypertension: Secondary | ICD-10-CM | POA: Diagnosis present

## 2021-12-26 DIAGNOSIS — R34 Anuria and oliguria: Secondary | ICD-10-CM | POA: Diagnosis not present

## 2021-12-26 DIAGNOSIS — R7989 Other specified abnormal findings of blood chemistry: Secondary | ICD-10-CM | POA: Diagnosis present

## 2021-12-26 DIAGNOSIS — K7030 Alcoholic cirrhosis of liver without ascites: Secondary | ICD-10-CM | POA: Diagnosis present

## 2021-12-26 DIAGNOSIS — F10239 Alcohol dependence with withdrawal, unspecified: Secondary | ICD-10-CM | POA: Diagnosis present

## 2021-12-26 DIAGNOSIS — Z9181 History of falling: Secondary | ICD-10-CM

## 2021-12-26 DIAGNOSIS — I469 Cardiac arrest, cause unspecified: Secondary | ICD-10-CM

## 2021-12-26 DIAGNOSIS — Z91148 Patient's other noncompliance with medication regimen for other reason: Secondary | ICD-10-CM

## 2021-12-26 DIAGNOSIS — E876 Hypokalemia: Secondary | ICD-10-CM | POA: Diagnosis not present

## 2021-12-26 DIAGNOSIS — I5043 Acute on chronic combined systolic (congestive) and diastolic (congestive) heart failure: Secondary | ICD-10-CM | POA: Diagnosis present

## 2021-12-26 DIAGNOSIS — I468 Cardiac arrest due to other underlying condition: Secondary | ICD-10-CM | POA: Diagnosis not present

## 2021-12-26 DIAGNOSIS — T17908A Unspecified foreign body in respiratory tract, part unspecified causing other injury, initial encounter: Secondary | ICD-10-CM

## 2021-12-26 DIAGNOSIS — I110 Hypertensive heart disease with heart failure: Secondary | ICD-10-CM | POA: Diagnosis present

## 2021-12-26 DIAGNOSIS — J690 Pneumonitis due to inhalation of food and vomit: Secondary | ICD-10-CM | POA: Diagnosis not present

## 2021-12-26 DIAGNOSIS — W19XXXA Unspecified fall, initial encounter: Secondary | ICD-10-CM

## 2021-12-26 DIAGNOSIS — Z8673 Personal history of transient ischemic attack (TIA), and cerebral infarction without residual deficits: Secondary | ICD-10-CM

## 2021-12-26 DIAGNOSIS — J441 Chronic obstructive pulmonary disease with (acute) exacerbation: Principal | ICD-10-CM

## 2021-12-26 DIAGNOSIS — Z91199 Patient's noncompliance with other medical treatment and regimen due to unspecified reason: Secondary | ICD-10-CM

## 2021-12-26 DIAGNOSIS — Z720 Tobacco use: Secondary | ICD-10-CM | POA: Diagnosis present

## 2021-12-26 DIAGNOSIS — J9601 Acute respiratory failure with hypoxia: Secondary | ICD-10-CM | POA: Diagnosis not present

## 2021-12-26 DIAGNOSIS — T17528A Food in bronchus causing other injury, initial encounter: Secondary | ICD-10-CM | POA: Diagnosis not present

## 2021-12-26 DIAGNOSIS — F1011 Alcohol abuse, in remission: Secondary | ICD-10-CM | POA: Diagnosis present

## 2021-12-26 DIAGNOSIS — Z66 Do not resuscitate: Secondary | ICD-10-CM | POA: Diagnosis not present

## 2021-12-26 DIAGNOSIS — I2510 Atherosclerotic heart disease of native coronary artery without angina pectoris: Secondary | ICD-10-CM | POA: Diagnosis present

## 2021-12-26 DIAGNOSIS — Z638 Other specified problems related to primary support group: Secondary | ICD-10-CM

## 2021-12-26 DIAGNOSIS — R54 Age-related physical debility: Secondary | ICD-10-CM | POA: Diagnosis present

## 2021-12-26 DIAGNOSIS — Z7951 Long term (current) use of inhaled steroids: Secondary | ICD-10-CM

## 2021-12-26 DIAGNOSIS — E119 Type 2 diabetes mellitus without complications: Secondary | ICD-10-CM | POA: Diagnosis present

## 2021-12-26 DIAGNOSIS — Z515 Encounter for palliative care: Secondary | ICD-10-CM

## 2021-12-26 DIAGNOSIS — I959 Hypotension, unspecified: Secondary | ICD-10-CM | POA: Diagnosis not present

## 2021-12-26 DIAGNOSIS — Z79899 Other long term (current) drug therapy: Secondary | ICD-10-CM

## 2021-12-26 DIAGNOSIS — F17210 Nicotine dependence, cigarettes, uncomplicated: Secondary | ICD-10-CM | POA: Diagnosis present

## 2021-12-26 DIAGNOSIS — L89890 Pressure ulcer of other site, unstageable: Secondary | ICD-10-CM | POA: Diagnosis present

## 2021-12-26 DIAGNOSIS — I252 Old myocardial infarction: Secondary | ICD-10-CM

## 2021-12-26 DIAGNOSIS — I77810 Thoracic aortic ectasia: Secondary | ICD-10-CM | POA: Diagnosis present

## 2021-12-26 DIAGNOSIS — Z91119 Patient's noncompliance with dietary regimen due to unspecified reason: Secondary | ICD-10-CM

## 2021-12-26 DIAGNOSIS — F10939 Alcohol use, unspecified with withdrawal, unspecified: Secondary | ICD-10-CM

## 2021-12-26 DIAGNOSIS — G9349 Other encephalopathy: Secondary | ICD-10-CM | POA: Diagnosis not present

## 2021-12-26 DIAGNOSIS — D72829 Elevated white blood cell count, unspecified: Secondary | ICD-10-CM

## 2021-12-26 LAB — CBC
HCT: 34.3 % — ABNORMAL LOW (ref 39.0–52.0)
Hemoglobin: 12.1 g/dL — ABNORMAL LOW (ref 13.0–17.0)
MCH: 33.6 pg (ref 26.0–34.0)
MCHC: 35.3 g/dL (ref 30.0–36.0)
MCV: 95.3 fL (ref 80.0–100.0)
Platelets: 166 10*3/uL (ref 150–400)
RBC: 3.6 MIL/uL — ABNORMAL LOW (ref 4.22–5.81)
RDW: 16.5 % — ABNORMAL HIGH (ref 11.5–15.5)
WBC: 11.3 10*3/uL — ABNORMAL HIGH (ref 4.0–10.5)
nRBC: 0 % (ref 0.0–0.2)

## 2021-12-26 LAB — BASIC METABOLIC PANEL
Anion gap: 11 (ref 5–15)
BUN: 18 mg/dL (ref 6–20)
CO2: 21 mmol/L — ABNORMAL LOW (ref 22–32)
Calcium: 8.3 mg/dL — ABNORMAL LOW (ref 8.9–10.3)
Chloride: 96 mmol/L — ABNORMAL LOW (ref 98–111)
Creatinine, Ser: 0.59 mg/dL — ABNORMAL LOW (ref 0.61–1.24)
GFR, Estimated: >60 mL/min (ref >60)
Glucose, Bld: 93 mg/dL (ref 70–99)
Potassium: 3.6 mmol/L (ref 3.5–5.1)
Sodium: 128 mmol/L — ABNORMAL LOW (ref 135–145)

## 2021-12-26 MED ORDER — IPRATROPIUM BROMIDE 0.02 % IN SOLN
0.5000 mg | Freq: Once | RESPIRATORY_TRACT | Status: AC
  Administered 2021-12-26: 0.5 mg via RESPIRATORY_TRACT
  Filled 2021-12-26: qty 2.5

## 2021-12-26 MED ORDER — ACETAMINOPHEN 325 MG PO TABS
650.0000 mg | ORAL_TABLET | Freq: Once | ORAL | Status: AC
Start: 2021-12-26 — End: 2021-12-26
  Administered 2021-12-26: 650 mg via ORAL
  Filled 2021-12-26: qty 2

## 2021-12-26 MED ORDER — ALBUTEROL SULFATE (2.5 MG/3ML) 0.083% IN NEBU
5.0000 mg | INHALATION_SOLUTION | Freq: Once | RESPIRATORY_TRACT | Status: AC
  Administered 2021-12-26: 5 mg via RESPIRATORY_TRACT
  Filled 2021-12-26: qty 6

## 2021-12-26 NOTE — ED Triage Notes (Signed)
Pt bib ems for sob.  Recent icu admission.  Hx of chf and copd.  Pt is alert and answering questions appropriately.  99% on RA.  Ble edema noted.  Lungs clear by ems.

## 2021-12-26 NOTE — ED Provider Notes (Signed)
[facility] EMERGENCY DEPARTMENT Provider Note   CSN: [id number] Arrival date & time: 12/12/2021  2249     History {Add pertinent medical, surgical, social history, OB history to HPI:1} Chief Complaint  Patient presents with   Shortness of Breath    Francisco Robinson is a 61 y.o. male.  The history is provided by the patient.  Shortness of Breath Severity:  Moderate Onset quality:  Gradual Timing:  Constant Progression:  Worsening Chronicity:  Chronic Relieved by:  Nothing Worsened by:  Activity Associated symptoms: cough and fever   Patient with extensive medical history including CHF, alcohol use disorder, COPD presents with increasing shortness of breath and edema.  Patient reports he was recently in the hospital and after leaving he started having increasing shortness of breath and diffuse edema.  He reports he is still smoking.  He reports he is coughing up yellow mucus.  No hemoptysis.  He reports fevers.     Home Medications Prior to Admission medications   Medication Sig Start Date End Date Taking? Authorizing Provider  albuterol (VENTOLIN HFA) 108 (90 Base) MCG/ACT inhaler Inhale 2 puffs into the lungs every 6 (six) hours as needed for wheezing or shortness of breath. 10/07/21   Roxan Hockey, MD  aspirin EC 81 MG EC tablet Take 1 tablet (81 mg total) by mouth daily with breakfast. Swallow whole. Patient not taking: Reported on 10/28/2021 10/08/21   Roxan Hockey, MD  atorvastatin (LIPITOR) 40 MG tablet Take 1 tablet (40 mg total) by mouth daily. 12/22/21 01/21/22  Wynetta Emery, Clanford L, MD  carvedilol (COREG) 3.125 MG tablet Take 1 tablet (3.125 mg total) by mouth 2 (two) times daily with a meal. Patient not taking: Reported on 12/18/2021 10/07/21   Roxan Hockey, MD  dextromethorphan-guaiFENesin (MUCINEX DM) 30-600 MG 12hr tablet Take 1 tablet by mouth 2 (two) times daily. 10/07/21   Roxan Hockey, MD  folic acid (FOLVITE) 1 MG tablet Take 1 tablet (1 mg total) by  mouth daily. 12/22/21   Johnson, Clanford L, MD  furosemide (LASIX) 20 MG tablet Take 1 tablet (20 mg total) by mouth daily. 12/22/21   Johnson, Clanford L, MD  isosorbide mononitrate (IMDUR) 30 MG 24 hr tablet Take 0.5 tablets (15 mg total) by mouth daily. 10/08/21   Roxan Hockey, MD  Multiple Vitamin (MULTIVITAMIN WITH MINERALS) TABS tablet Take 1 tablet by mouth daily. Patient not taking: Reported on 12/18/2021 10/08/21   Roxan Hockey, MD  predniSONE (DELTASONE) 20 MG tablet Take 2 tablets (40 mg total) by mouth daily with breakfast for 3 days. 12/23/21 12/30/2021  Johnson, Clanford L, MD  SYMBICORT 160-4.5 MCG/ACT inhaler Inhale 1 puff into the lungs 2 (two) times daily. 10/07/21   Roxan Hockey, MD  thiamine 100 MG tablet Take 1 tablet (100 mg total) by mouth daily. Patient not taking: Reported on 12/18/2021 10/07/21   Roxan Hockey, MD      Allergies    Patient has no known allergies.    Review of Systems   Review of Systems  Constitutional:  Positive for fever.  Respiratory:  Positive for cough and shortness of breath.   Cardiovascular:  Positive for leg swelling.  Genitourinary:  Positive for flank pain.       "My kidneys hurt"   Physical Exam Updated Vital Signs BP (!) 124/96   Pulse (!) 119   Temp 98.1 F (36.7 C)   Resp 20   Ht 1.702 m ('5\' 7"'$ )   Wt 61.1 kg   SpO2  100%   BMI 21.10 kg/m  Physical Exam CONSTITUTIONAL: Chronically ill-appearing, anxious HEAD: Normocephalic/atraumatic EYES: EOMI/PERRL ENMT: Mucous membranes moist NECK: supple no meningeal signs SPINE/BACK:entire spine nontender CV: S1/S2 noted LUNGS: Mild tachypnea, coarse breath sounds bilaterally ABDOMEN: soft, nontender, no rebound or guarding, bowel sounds noted throughout abdomen GU:no cva tenderness NEURO: Pt is awake/alert/appropriate, moves all extremitiesx4.  No facial droop.   EXTREMITIES: pulses normal/equal, full ROM, symmetric pitting edema to bilateral lower extremities SKIN: warm,  color normal PSYCH: Anxious ED Results / Procedures / Treatments   Labs (all labs ordered are listed, but only abnormal results are displayed) Labs Reviewed  CBC - Abnormal; Notable for the following components:      Result Value   WBC 11.3 (*)    RBC 3.60 (*)    Hemoglobin 12.1 (*)    HCT 34.3 (*)    RDW 16.5 (*)    All other components within normal limits  BASIC METABOLIC PANEL  URINALYSIS, ROUTINE W REFLEX MICROSCOPIC    EKG EKG Interpretation  Date/Time:  Friday Dec 26 2021 22:58:35 EDT Ventricular Rate:  117 PR Interval:  165 QRS Duration: 99 QT Interval:  332 QTC Calculation: 464 R Axis:   -3 Text Interpretation: Sinus tachycardia Anterior infarct, old Nonspecific T abnormalities, lateral leads Baseline wander in lead(s) V6 Confirmed by Ripley Fraise [phone]) on 01/01/2022 11:08:38 PM  Radiology No results found.  Procedures Procedures  {Document cardiac monitor, telemetry assessment procedure when appropriate:1}  Medications Ordered in ED Medications  albuterol (PROVENTIL) (2.5 MG/3ML) 0.083% nebulizer solution 5 mg (5 mg Nebulization Given 12/24/2021 2328)  ipratropium (ATROVENT) nebulizer solution 0.5 mg (0.5 mg Nebulization Given 12/21/2021 2328)  acetaminophen (TYLENOL) tablet 650 mg (650 mg Oral Given 12/09/2021 2331)    ED Course/ Medical Decision Making/ A&P                           Medical Decision Making Amount and/or Complexity of Data Reviewed Labs: ordered. Radiology: ordered.  Risk OTC drugs. Prescription drug management.   This patient presents to the ED for concern of shortness of breath, this involves an extensive number of treatment options, and is a complaint that carries with it a high risk of complications and morbidity.  The differential diagnosis includes but is not limited to CHF exacerbation, pneumonia, pulmonary embolism, anemia, COPD exacerbation  Comorbidities that complicate the patient evaluation: Patient's presentation is  complicated by their history of COPD and congestive heart failure  Social Determinants of Health: Patient's  lack of medical adherence, alcohol use disorder, tobacco abuse   increases the complexity of managing their presentation  Additional history obtained: Records reviewed previous admission documents  Lab Tests: I Ordered, and personally interpreted labs.  The pertinent results include: Leukocytosis  Imaging Studies ordered: I ordered imaging studies including X-ray chest   I independently visualized and interpreted imaging which showed *** I agree with the radiologist interpretation  Cardiac Monitoring: The patient was maintained on a cardiac monitor.  I personally viewed and interpreted the cardiac monitor which showed an underlying rhythm of:  sinus rhythm  Medicines ordered and prescription drug management: I ordered medication including albuterol Atrovent for shortness of breath Reevaluation of the patient after these medicines showed that the patient    {resolved/improved/worsened:23923::"improved"}  Test Considered: Patient is low risk / negative by ***, therefore do not feel that *** is indicated.  Critical Interventions:  ***  Consultations Obtained: I requested consultation with  the {consultation:26851}, and discussed  findings as well as pertinent plan - they recommend: ***  Reevaluation: After the interventions noted above, I reevaluated the patient and found that they have :{resolved/improved/worsened:23923::"improved"}  Complexity of problems addressed: Patient's presentation is most consistent with  {EIHD:39122}  Disposition: After consideration of the diagnostic results and the patient's response to treatment,  I feel that the patent would benefit from {disposition:26850}.     {Document critical care time when appropriate:1} {Document review of labs and clinical decision tools ie heart score, Chads2Vasc2 etc:1}  {Document your independent review of  radiology images, and any outside records:1} {Document your discussion with family members, caretakers, and with consultants:1} {Document social determinants of health affecting pt's care:1} {Document your decision making why or why not admission, treatments were needed:1} Final Clinical Impression(s) / ED Diagnoses Final diagnoses:  None    Rx / DC Orders ED Discharge Orders     None

## 2021-12-27 ENCOUNTER — Inpatient Hospital Stay (HOSPITAL_COMMUNITY): Payer: Medicaid Other

## 2021-12-27 ENCOUNTER — Other Ambulatory Visit (HOSPITAL_COMMUNITY): Payer: Medicaid Other

## 2021-12-27 ENCOUNTER — Other Ambulatory Visit (HOSPITAL_BASED_OUTPATIENT_CLINIC_OR_DEPARTMENT_OTHER): Payer: Self-pay | Admitting: *Deleted

## 2021-12-27 DIAGNOSIS — R7989 Other specified abnormal findings of blood chemistry: Secondary | ICD-10-CM

## 2021-12-27 DIAGNOSIS — R34 Anuria and oliguria: Secondary | ICD-10-CM | POA: Diagnosis not present

## 2021-12-27 DIAGNOSIS — Z66 Do not resuscitate: Secondary | ICD-10-CM | POA: Diagnosis not present

## 2021-12-27 DIAGNOSIS — J9601 Acute respiratory failure with hypoxia: Secondary | ICD-10-CM | POA: Diagnosis not present

## 2021-12-27 DIAGNOSIS — F17210 Nicotine dependence, cigarettes, uncomplicated: Secondary | ICD-10-CM | POA: Diagnosis present

## 2021-12-27 DIAGNOSIS — T17908D Unspecified foreign body in respiratory tract, part unspecified causing other injury, subsequent encounter: Secondary | ICD-10-CM | POA: Diagnosis not present

## 2021-12-27 DIAGNOSIS — I110 Hypertensive heart disease with heart failure: Secondary | ICD-10-CM | POA: Diagnosis present

## 2021-12-27 DIAGNOSIS — F10239 Alcohol dependence with withdrawal, unspecified: Secondary | ICD-10-CM | POA: Diagnosis present

## 2021-12-27 DIAGNOSIS — E871 Hypo-osmolality and hyponatremia: Secondary | ICD-10-CM | POA: Diagnosis present

## 2021-12-27 DIAGNOSIS — Z8673 Personal history of transient ischemic attack (TIA), and cerebral infarction without residual deficits: Secondary | ICD-10-CM | POA: Diagnosis not present

## 2021-12-27 DIAGNOSIS — I5041 Acute combined systolic (congestive) and diastolic (congestive) heart failure: Secondary | ICD-10-CM | POA: Diagnosis not present

## 2021-12-27 DIAGNOSIS — E876 Hypokalemia: Secondary | ICD-10-CM

## 2021-12-27 DIAGNOSIS — J441 Chronic obstructive pulmonary disease with (acute) exacerbation: Secondary | ICD-10-CM | POA: Diagnosis present

## 2021-12-27 DIAGNOSIS — I77810 Thoracic aortic ectasia: Secondary | ICD-10-CM | POA: Diagnosis present

## 2021-12-27 DIAGNOSIS — F1011 Alcohol abuse, in remission: Secondary | ICD-10-CM

## 2021-12-27 DIAGNOSIS — I468 Cardiac arrest due to other underlying condition: Secondary | ICD-10-CM | POA: Diagnosis not present

## 2021-12-27 DIAGNOSIS — T17908A Unspecified foreign body in respiratory tract, part unspecified causing other injury, initial encounter: Secondary | ICD-10-CM | POA: Diagnosis not present

## 2021-12-27 DIAGNOSIS — Z91199 Patient's noncompliance with other medical treatment and regimen due to unspecified reason: Secondary | ICD-10-CM | POA: Diagnosis not present

## 2021-12-27 DIAGNOSIS — I2510 Atherosclerotic heart disease of native coronary artery without angina pectoris: Secondary | ICD-10-CM | POA: Diagnosis present

## 2021-12-27 DIAGNOSIS — W19XXXA Unspecified fall, initial encounter: Secondary | ICD-10-CM | POA: Diagnosis not present

## 2021-12-27 DIAGNOSIS — F10939 Alcohol use, unspecified with withdrawal, unspecified: Secondary | ICD-10-CM | POA: Diagnosis not present

## 2021-12-27 DIAGNOSIS — I10 Essential (primary) hypertension: Secondary | ICD-10-CM

## 2021-12-27 DIAGNOSIS — Z79899 Other long term (current) drug therapy: Secondary | ICD-10-CM | POA: Diagnosis not present

## 2021-12-27 DIAGNOSIS — I252 Old myocardial infarction: Secondary | ICD-10-CM | POA: Diagnosis not present

## 2021-12-27 DIAGNOSIS — Z515 Encounter for palliative care: Secondary | ICD-10-CM | POA: Diagnosis not present

## 2021-12-27 DIAGNOSIS — E119 Type 2 diabetes mellitus without complications: Secondary | ICD-10-CM | POA: Diagnosis present

## 2021-12-27 DIAGNOSIS — G9349 Other encephalopathy: Secondary | ICD-10-CM | POA: Diagnosis not present

## 2021-12-27 DIAGNOSIS — J690 Pneumonitis due to inhalation of food and vomit: Secondary | ICD-10-CM | POA: Diagnosis not present

## 2021-12-27 DIAGNOSIS — K7030 Alcoholic cirrhosis of liver without ascites: Secondary | ICD-10-CM | POA: Diagnosis present

## 2021-12-27 DIAGNOSIS — T17528A Food in bronchus causing other injury, initial encounter: Secondary | ICD-10-CM | POA: Diagnosis not present

## 2021-12-27 DIAGNOSIS — I5043 Acute on chronic combined systolic (congestive) and diastolic (congestive) heart failure: Secondary | ICD-10-CM | POA: Diagnosis present

## 2021-12-27 DIAGNOSIS — I952 Hypotension due to drugs: Secondary | ICD-10-CM | POA: Diagnosis not present

## 2021-12-27 DIAGNOSIS — D72829 Elevated white blood cell count, unspecified: Secondary | ICD-10-CM

## 2021-12-27 DIAGNOSIS — I469 Cardiac arrest, cause unspecified: Secondary | ICD-10-CM | POA: Diagnosis not present

## 2021-12-27 DIAGNOSIS — Z720 Tobacco use: Secondary | ICD-10-CM

## 2021-12-27 DIAGNOSIS — L89890 Pressure ulcer of other site, unstageable: Secondary | ICD-10-CM | POA: Diagnosis present

## 2021-12-27 LAB — D-DIMER, QUANTITATIVE: D-Dimer, Quant: 5.9 ug/mL-FEU — ABNORMAL HIGH (ref 0.00–0.50)

## 2021-12-27 LAB — COMPREHENSIVE METABOLIC PANEL
ALT: 96 U/L — ABNORMAL HIGH (ref 0–44)
AST: 181 U/L — ABNORMAL HIGH (ref 15–41)
Albumin: 3.1 g/dL — ABNORMAL LOW (ref 3.5–5.0)
Alkaline Phosphatase: 196 U/L — ABNORMAL HIGH (ref 38–126)
Anion gap: 10 (ref 5–15)
BUN: 18 mg/dL (ref 6–20)
CO2: 23 mmol/L (ref 22–32)
Calcium: 8.3 mg/dL — ABNORMAL LOW (ref 8.9–10.3)
Chloride: 96 mmol/L — ABNORMAL LOW (ref 98–111)
Creatinine, Ser: 0.54 mg/dL — ABNORMAL LOW (ref 0.61–1.24)
GFR, Estimated: >60 mL/min (ref >60)
Glucose, Bld: 138 mg/dL — ABNORMAL HIGH (ref 70–99)
Potassium: 3.3 mmol/L — ABNORMAL LOW (ref 3.5–5.1)
Sodium: 129 mmol/L — ABNORMAL LOW (ref 135–145)
Total Bilirubin: 3.4 mg/dL — ABNORMAL HIGH (ref 0.3–1.2)
Total Protein: 6.5 g/dL (ref 6.5–8.1)

## 2021-12-27 LAB — CBC
HCT: 36.8 % — ABNORMAL LOW (ref 39.0–52.0)
Hemoglobin: 12.4 g/dL — ABNORMAL LOW (ref 13.0–17.0)
MCH: 33.1 pg (ref 26.0–34.0)
MCHC: 33.7 g/dL (ref 30.0–36.0)
MCV: 98.1 fL (ref 80.0–100.0)
Platelets: 172 10*3/uL (ref 150–400)
RBC: 3.75 MIL/uL — ABNORMAL LOW (ref 4.22–5.81)
RDW: 16.7 % — ABNORMAL HIGH (ref 11.5–15.5)
WBC: 9.9 10*3/uL (ref 4.0–10.5)
nRBC: 0 % (ref 0.0–0.2)

## 2021-12-27 LAB — URINALYSIS, ROUTINE W REFLEX MICROSCOPIC
Bilirubin Urine: NEGATIVE
Glucose, UA: NEGATIVE mg/dL
Hgb urine dipstick: NEGATIVE
Ketones, ur: NEGATIVE mg/dL
Leukocytes,Ua: NEGATIVE
Nitrite: NEGATIVE
Protein, ur: NEGATIVE mg/dL
Specific Gravity, Urine: 1.013 (ref 1.005–1.030)
pH: 5 (ref 5.0–8.0)

## 2021-12-27 LAB — ETHANOL: Alcohol, Ethyl (B): <10 mg/dL (ref <10)

## 2021-12-27 LAB — PHOSPHORUS: Phosphorus: 3.6 mg/dL (ref 2.5–4.6)

## 2021-12-27 LAB — MRSA NEXT GEN BY PCR, NASAL: MRSA by PCR Next Gen: NOT DETECTED

## 2021-12-27 LAB — MAGNESIUM: Magnesium: 1.8 mg/dL (ref 1.7–2.4)

## 2021-12-27 LAB — BRAIN NATRIURETIC PEPTIDE: B Natriuretic Peptide: >4500 pg/mL — ABNORMAL HIGH (ref 0.0–100.0)

## 2021-12-27 LAB — APTT: aPTT: 30 seconds (ref 24–36)

## 2021-12-27 MED ORDER — HALOPERIDOL LACTATE 5 MG/ML IJ SOLN
2.0000 mg | Freq: Once | INTRAMUSCULAR | Status: AC
Start: 2021-12-28 — End: 2021-12-27
  Administered 2021-12-27: 2 mg via INTRAMUSCULAR
  Filled 2021-12-27: qty 1

## 2021-12-27 MED ORDER — IOHEXOL 350 MG/ML SOLN
100.0000 mL | Freq: Once | INTRAVENOUS | Status: AC | PRN
  Administered 2021-12-28: 75 mL via INTRAVENOUS

## 2021-12-27 MED ORDER — THIAMINE HCL 100 MG/ML IJ SOLN: 100.0000 mg | Freq: Every day | INTRAMUSCULAR | Status: DC

## 2021-12-27 MED ORDER — IPRATROPIUM-ALBUTEROL 0.5-2.5 (3) MG/3ML IN SOLN
3.0000 mL | RESPIRATORY_TRACT | Status: DC | PRN
  Administered 2021-12-27: 3 mL via RESPIRATORY_TRACT
  Filled 2021-12-27: qty 3

## 2021-12-27 MED ORDER — METHYLPREDNISOLONE SODIUM SUCC 40 MG IJ SOLR
40.0000 mg | Freq: Two times a day (BID) | INTRAMUSCULAR | Status: DC
  Administered 2021-12-27 – 2021-12-30 (×7): 40 mg via INTRAVENOUS
  Filled 2021-12-27 (×7): qty 1

## 2021-12-27 MED ORDER — LORAZEPAM 2 MG/ML IJ SOLN
1.0000 mg | Freq: Once | INTRAMUSCULAR | Status: AC
  Administered 2021-12-27: 1 mg via INTRAVENOUS
  Filled 2021-12-27: qty 1

## 2021-12-27 MED ORDER — FOLIC ACID 1 MG PO TABS
1.0000 mg | ORAL_TABLET | Freq: Every day | ORAL | Status: DC
  Administered 2021-12-27 – 2021-12-29 (×3): 1 mg via ORAL
  Filled 2021-12-27 (×3): qty 1

## 2021-12-27 MED ORDER — LORAZEPAM 1 MG PO TABS
1.0000 mg | ORAL_TABLET | ORAL | Status: AC | PRN
  Administered 2021-12-27: 1 mg via ORAL
  Administered 2021-12-28: 2 mg via ORAL
  Filled 2021-12-27 (×2): qty 2
  Filled 2021-12-27: qty 1

## 2021-12-27 MED ORDER — AZITHROMYCIN 250 MG PO TABS
500.0000 mg | ORAL_TABLET | Freq: Every day | ORAL | Status: AC
  Administered 2021-12-27: 500 mg via ORAL
  Filled 2021-12-27: qty 2

## 2021-12-27 MED ORDER — THIAMINE HCL 100 MG PO TABS
100.0000 mg | ORAL_TABLET | Freq: Every day | ORAL | Status: DC
  Administered 2021-12-27 – 2021-12-29 (×3): 100 mg via ORAL
  Filled 2021-12-27 (×3): qty 1

## 2021-12-27 MED ORDER — METHYLPREDNISOLONE SODIUM SUCC 125 MG IJ SOLR
125.0000 mg | Freq: Once | INTRAMUSCULAR | Status: AC
  Administered 2021-12-27: 125 mg via INTRAVENOUS
  Filled 2021-12-27: qty 2

## 2021-12-27 MED ORDER — ENOXAPARIN SODIUM 40 MG/0.4ML IJ SOSY
40.0000 mg | PREFILLED_SYRINGE | INTRAMUSCULAR | Status: DC
  Administered 2021-12-27 – 2021-12-30 (×2): 40 mg via SUBCUTANEOUS
  Filled 2021-12-27 (×3): qty 0.4

## 2021-12-27 MED ORDER — ADULT MULTIVITAMIN W/MINERALS CH
1.0000 | ORAL_TABLET | Freq: Every day | ORAL | Status: DC
  Administered 2021-12-27 – 2021-12-29 (×3): 1 via ORAL
  Filled 2021-12-27 (×3): qty 1

## 2021-12-27 MED ORDER — AZITHROMYCIN 250 MG PO TABS
250.0000 mg | ORAL_TABLET | Freq: Every day | ORAL | Status: DC
  Administered 2021-12-28 – 2021-12-29 (×2): 250 mg via ORAL
  Filled 2021-12-27 (×2): qty 1

## 2021-12-27 MED ORDER — ALBUTEROL SULFATE (2.5 MG/3ML) 0.083% IN NEBU
10.0000 mg/h | INHALATION_SOLUTION | Freq: Once | RESPIRATORY_TRACT | Status: AC
Start: 2021-12-27 — End: 2021-12-27
  Administered 2021-12-27: 10 mg/h via RESPIRATORY_TRACT
  Filled 2021-12-27: qty 12

## 2021-12-27 MED ORDER — ATORVASTATIN CALCIUM 40 MG PO TABS
40.0000 mg | ORAL_TABLET | Freq: Every day | ORAL | Status: DC
  Administered 2021-12-27 – 2021-12-29 (×3): 40 mg via ORAL
  Filled 2021-12-27 (×3): qty 1

## 2021-12-27 MED ORDER — GUAIFENESIN-DM 100-10 MG/5ML PO SYRP: 5.0000 mL | ORAL_SOLUTION | ORAL | Status: DC | PRN

## 2021-12-27 MED ORDER — IPRATROPIUM BROMIDE 0.02 % IN SOLN
0.5000 mg | Freq: Four times a day (QID) | RESPIRATORY_TRACT | Status: DC
  Administered 2021-12-27 (×2): 0.5 mg via RESPIRATORY_TRACT
  Filled 2021-12-27 (×2): qty 2.5

## 2021-12-27 MED ORDER — LORAZEPAM 2 MG/ML IJ SOLN
1.0000 mg | INTRAMUSCULAR | Status: AC | PRN
  Administered 2021-12-27: 2 mg via INTRAVENOUS
  Administered 2021-12-27: 1 mg via INTRAVENOUS
  Administered 2021-12-27 – 2021-12-28 (×3): 4 mg via INTRAVENOUS
  Administered 2021-12-28 (×3): 2 mg via INTRAVENOUS
  Administered 2021-12-29 (×2): 4 mg via INTRAVENOUS
  Administered 2021-12-29: 2 mg via INTRAVENOUS
  Administered 2021-12-29 (×2): 4 mg via INTRAVENOUS
  Filled 2021-12-27 (×2): qty 2
  Filled 2021-12-27 (×4): qty 1
  Filled 2021-12-27 (×2): qty 2
  Filled 2021-12-27: qty 1
  Filled 2021-12-27 (×4): qty 2
  Filled 2021-12-27 (×2): qty 1

## 2021-12-27 MED ORDER — POTASSIUM CHLORIDE 20 MEQ PO PACK
40.0000 meq | PACK | Freq: Two times a day (BID) | ORAL | Status: AC
  Administered 2021-12-27 (×2): 40 meq via ORAL
  Filled 2021-12-27 (×2): qty 2

## 2021-12-27 MED ORDER — DM-GUAIFENESIN ER 30-600 MG PO TB12
1.0000 | ORAL_TABLET | Freq: Two times a day (BID) | ORAL | Status: DC
Start: 2021-12-27 — End: 2021-12-29
  Administered 2021-12-27 – 2021-12-29 (×6): 1 via ORAL
  Filled 2021-12-27 (×6): qty 1

## 2021-12-27 MED ORDER — NICOTINE 21 MG/24HR TD PT24
21.0000 mg | MEDICATED_PATCH | Freq: Every day | TRANSDERMAL | Status: DC
Start: 2021-12-27 — End: 2021-12-30
  Administered 2021-12-27 – 2021-12-30 (×2): 21 mg via TRANSDERMAL
  Filled 2021-12-27 (×2): qty 1

## 2021-12-27 MED ORDER — ASPIRIN 81 MG PO TBEC
81.0000 mg | DELAYED_RELEASE_TABLET | Freq: Every day | ORAL | Status: DC
  Administered 2021-12-27 – 2021-12-29 (×3): 81 mg via ORAL
  Filled 2021-12-27 (×3): qty 1

## 2021-12-27 MED ORDER — CHLORHEXIDINE GLUCONATE CLOTH 2 % EX PADS
6.0000 | MEDICATED_PAD | Freq: Every day | CUTANEOUS | Status: DC
  Administered 2021-12-27 – 2022-01-01 (×6): 6 via TOPICAL

## 2021-12-27 MED ORDER — MAGNESIUM OXIDE -MG SUPPLEMENT 400 (240 MG) MG PO TABS
400.0000 mg | ORAL_TABLET | Freq: Two times a day (BID) | ORAL | Status: DC
Start: 2021-12-27 — End: 2021-12-29
  Administered 2021-12-27 – 2021-12-29 (×6): 400 mg via ORAL
  Filled 2021-12-27 (×7): qty 1

## 2021-12-27 MED ORDER — LORAZEPAM 2 MG/ML IJ SOLN
1.0000 mg | Freq: Once | INTRAMUSCULAR | Status: AC
Start: 2021-12-27 — End: 2021-12-27
  Administered 2021-12-27: 1 mg via INTRAVENOUS
  Filled 2021-12-27: qty 1

## 2021-12-27 MED ORDER — CARVEDILOL 3.125 MG PO TABS
3.1250 mg | ORAL_TABLET | Freq: Two times a day (BID) | ORAL | Status: DC
  Administered 2021-12-27 – 2021-12-29 (×5): 3.125 mg via ORAL
  Filled 2021-12-27 (×5): qty 1

## 2021-12-27 MED ORDER — ISOSORBIDE MONONITRATE ER 30 MG PO TB24
15.0000 mg | ORAL_TABLET | Freq: Every day | ORAL | Status: DC
  Administered 2021-12-27 – 2021-12-29 (×3): 15 mg via ORAL
  Filled 2021-12-27 (×3): qty 1

## 2021-12-27 MED ORDER — PANTOPRAZOLE SODIUM 40 MG IV SOLR
40.0000 mg | INTRAVENOUS | Status: DC
Start: 2021-12-27 — End: 2021-12-29
  Administered 2021-12-27 – 2021-12-28 (×3): 40 mg via INTRAVENOUS
  Filled 2021-12-27 (×3): qty 10

## 2021-12-27 NOTE — ED Notes (Addendum)
Admitting called to see pt/ re-assess bed type/level. Admitting MD at [facility]. Pt crawled to end of bed, sitting on edge. Repositioned. Sz pads returned to bed. Incomplete VSS.

## 2021-12-27 NOTE — Progress Notes (Signed)
Same day note  Patient seen and examined at bedside.  Patient was admitted to the hospital for congestive breath cough dyspnea wheezing.  At the time of my evaluation, patient complains of mild pain, nursing staff reported that the patient has been intermittently agitated and trying to get out of the bed.  Physical examination reveals deconditioned male intermittently somnolent and agitated.  Laboratory data and imaging was reviewed  Assessment and Plan.  Acute exacerbation of COPD Patient presents with cough wheezing and dyspnea.  Has active wheezing.  Continue nebulizers more frequently every 4 hours.  Continue Solu-Medrol azithromycin.  Protonix.  Continue incentive spirometry and flutter valve.  Alcohol withdrawal/alcohol abuse History of drinking 12 packs of beer every day.  Patient appears to be agitated confused trying to get out of the bed.  Continue CIWA protocol thiamine folic acid multivitamin.  We will upgrade to stepdown unit for closer monitoring..  Chronic hyponatremia possibly secondary to beer potomania/diuretic use Na 128; will monitor sodium levels in a.m.  Mild hypokalemia will replenish orally.  Check labs in AM.  Leukocytosis possibly reactive Improved.  WBC repeat at 9.9.  Elevated D-dimer D-dimer 5.90, CTA chest pending.   Mechanical fall Had a fall in the ED as well.  CT head unremarkable.  CT C-spine without any acute findings. Continue fall precautions, PT OT evaluation.   Type 2 diabetes mellitus Hemoglobin A1c as of 9 days ago was 5.4 sliding scale insulin as necessary.  Continue To monitor.   History of CAD Continue aspirin, statin, imdur, Coreg .  No chest pain at this time.   Essential hypertension Continue Coreg blood pressure seems to be stable at this time.  Chronic combined systolic and diastolic CHF 2D echocardiogram done in January 2023 showed LVEF of 30% severe hypokinesis of the inferior, distal, anterior, apical, distal anterior,  septal walls.  Mild LVH.  LV diastolic parameters were indeterminate.  Chest x-ray without any obvious congestion.  BNP elevated> 4,500 (same as 5/15-day of discharge).  Continue intake and output charting Daily weights Lasix IV 40 twice daily.  Check 2D echocardiogram.  Tobacco abuse Continue nicotine patch.  No Charge  Signed,  Delila Pereyra, MD [facility]

## 2021-12-27 NOTE — ED Notes (Signed)
Pt pulled IV out from left forearm. Pt making verbal threats towards phlebotomy and ER Staff. Pt threatening to punch staff for breaking his left arm. Pt currently swinging left arm at this RN demanding Ativan. EDP Notified. [employer] at bedside and [employer] was required to escort Pt to CT Scan due to violent behavior and threats being made.

## 2021-12-27 NOTE — Hospital Course (Addendum)
Francisco Robinson is a 61 y.o. male with past medical history of COPD, type 2 diabetes, systolic congestive heart failure, history of tobacco and alcohol abuse with previous admissions to the hospital presented to the hospital with shortness of breath, increased leg swelling.  He was recently admitted between 5 11-5/ 15 for acute on chronic combined systolic and diastolic heart failure but since discharge he has been having increasing shortness of breath leg swelling and abdominal distention so he decided come to the hospital.  In the ED, patient was noted to be tachypneic and tachycardic.  Labs showed mild leukocytosis in BMP with hyponatremia.  D-dimer was elevated at 5.9.  UA was negative for UTI.  Chest x-ray showed no acute abnormality and improved aeration.  Patient was given breathing treatment and was admitted to hospital for further evaluation and treatment.    Assessment and plan  Principal Problem:   COPD exacerbation ([place]) Active Problems:   Hyponatremia   History of ETOH abuse   Tobacco abuse   Essential hypertension   Leukocytosis   Elevated d-dimer   Fall   Acute exacerbation of chronic obstructive pulmonary disease (COPD) (H[facility])   Acute exacerbation of COPD Continue frequent nebulizers, Solu-Medrol, azithromycin.   Continue incentive spirometry and flutter valve.  Continue diuretics.  He has significant cough and wheezing in noisy breathing.  Acute on Chronic combined systolic and diastolic CHF 2D echocardiogram done in January 2023 showed LVEF of 30% severe hypokinesis of the inferior, distal, anterior, apical, distal anterior, septal walls.  Mild LVH.  LV diastolic parameters were indeterminate.  Chest x-ray without any obvious congestion.  BNP elevated> 4,500 (same as 5/15-day of discharge).  Continue intake and output charting, Daily weights Lasix IV 40 twice daily.  Repeat the 2D echocardiogram from 12/28/2021 showed LV ejection fraction of 20 to 25%.  Patient is at very high  risk of recurrent admission to the hospital due to ongoing noncompliance with fluid intake, alcohol ingestion, smoking.  Has had history of recurrent admissions to the hospital.  Continue strict intake and output.  Patient is very noncompliant with medications/diet as he continues to drink alcohol.  Overall prognosis for the patient is poor.  Had good urinary output yesterday at 2200 mL.  Still appears to be decompensated.  Alcohol withdrawal/alcohol abuse History of drinking 12 packs of beer every day.    Continue CIWA protocol thiamine folic acid multivitamin.  Appears calmer.  Chronic hyponatremia possibly secondary to beer potomania/diuretic use Na 128 on presentation.  Latest sodium of 132.  Mild hypokalemia improved with replacement.  Potassium of 4.4 today.  Leukocytosis possibly reactive.  Latest WBC at 22.6.  On steroids.  We will continue to monitor.  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  Recommended annual imaging.  Large right and small left pleural effusion with spiculated opacity in the right apex.   This will need to be followed up as outpatient.  Continue IV diuretics.  On IV Lasix 40 daily.   Mechanical fall Had a fall in the ED as well.  CT head unremarkable.  CT C-spine without any acute findings. Continue fall precautions, PT, OT evaluation pending..   Type 2 diabetes mellitus Hemoglobin A1c as of 9 days ago was 5.4, continue sliding scale insulin as necessary.     History of CAD Continue aspirin, statin, imdur, Coreg .  No chest pain at this time.  Essential hypertension Continue Coreg blood pressure seems to be stable at this time.  Tobacco abuse Continue nicotine patch.  Deconditioning debility frailty.  Due to advanced heart failure noncompliance ongoing alcohol abuse.  PT evaluation pending.

## 2021-12-27 NOTE — ED Provider Notes (Signed)
Patient being aggressive, threatening staff.  He also has a component of alcohol withdrawal.  Ativan has been ordered.  Due to safety of patient and staff, patient will be placed in restraints   Ripley Fraise, MD 12/27/21 0321

## 2021-12-27 NOTE — ED Notes (Signed)
Pt sleeping, NAD, calm, VSS, elevated BP, RR, HR. LS CTA. Pedal edema noted.

## 2021-12-27 NOTE — ED Provider Notes (Signed)
I was informed by nursing staff the patient fell out of bed.  Patient is awake and alert but has dried blood on his nose.  No signs of trauma to his back/abdomen, or extremities. CT imaging of the head/face/C-spine and been ordered. Patient also noted to have an elevated D-dimer that was ordered at the request of the hospitalist.  CT angio chest has been ordered   Ripley Fraise, MD 12/27/21 [phone]

## 2021-12-27 NOTE — ED Provider Notes (Signed)
CT head and C-spine do not show any obvious traumatic abnormalities.  Patient did not tolerate CT chest.  This will need to be performed once patient is more compliant.  Per CT C-spine, patient may have a lung mass which will need to be evaluated by CT chest.   Ripley Fraise, MD 12/27/21 [phone]

## 2021-12-27 NOTE — ED Notes (Signed)
Fetal position sleeping

## 2021-12-27 NOTE — ED Notes (Signed)
Remains intermittently awake and asleep, restless, fidgety, asking about weather, talking about going fishing, asking for a lighter, semi-cooperative. Tolerated POs.

## 2021-12-27 NOTE — ED Notes (Addendum)
Remains restless, fidgety, irritable, non-aggressive, non-belligerent, semi-cooperative, pulling self off monitor, pulling blankets and gown off, pulling [place] O2 off, pulling sz pads off rails. Asking for soda. Given.

## 2021-12-27 NOTE — ED Notes (Signed)
Patient transported to CT 

## 2021-12-27 NOTE — ED Notes (Signed)
Restless sleeping. Intermittently pulling wires off. IV secured with kling and ACE wrap.

## 2021-12-27 NOTE — ED Notes (Signed)
Sleeping, HOB 45 degrees, NAD, calm, incomplete VSS.

## 2021-12-27 NOTE — ED Notes (Addendum)
Pt sleeping, restless, fidgety. Arousable to voice, interactive. Sleepy/sedated. Answers questions. Follows commands. Agreeable to meds.

## 2021-12-27 NOTE — H&P (Signed)
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 chronic comorbidities is felt to place them at high risk for further clinical deterioration. Furthermore, it is not anticipated that the patient will be medically stable for discharge from the hospital within 2 midnights of admission.   * I certify that at the point of admission it is my clinical judgment that the patient will require inpatient hospital care spanning beyond 2 midnights from the point of admission due to high intensity of service, high risk for further deterioration and high frequency of surveillance required.*  Author: Bernadette Hoit, DO 12/27/2021 5:22 AM  For on call review [email or web address]

## 2021-12-27 NOTE — ED Notes (Signed)
Hospitalist at bedside 

## 2021-12-28 ENCOUNTER — Inpatient Hospital Stay (HOSPITAL_COMMUNITY): Payer: Medicaid Other

## 2021-12-28 DIAGNOSIS — R7989 Other specified abnormal findings of blood chemistry: Secondary | ICD-10-CM | POA: Diagnosis not present

## 2021-12-28 DIAGNOSIS — W19XXXA Unspecified fall, initial encounter: Secondary | ICD-10-CM | POA: Diagnosis not present

## 2021-12-28 DIAGNOSIS — I5041 Acute combined systolic (congestive) and diastolic (congestive) heart failure: Secondary | ICD-10-CM

## 2021-12-28 DIAGNOSIS — I10 Essential (primary) hypertension: Secondary | ICD-10-CM | POA: Diagnosis not present

## 2021-12-28 DIAGNOSIS — J441 Chronic obstructive pulmonary disease with (acute) exacerbation: Secondary | ICD-10-CM | POA: Diagnosis not present

## 2021-12-28 LAB — ECHOCARDIOGRAM LIMITED
AR max vel: 0.46 cm2
AV Area VTI: 0.47 cm2
AV Area mean vel: 0.41 cm2
AV Mean grad: 11 mmHg
AV Peak grad: 17.3 mmHg
Ao pk vel: 2.08 m/s
Height: 67 in
S' Lateral: 4.8 cm
Weight: 2282.2 oz

## 2021-12-28 LAB — COMPREHENSIVE METABOLIC PANEL
ALT: 71 U/L — ABNORMAL HIGH (ref 0–44)
AST: 105 U/L — ABNORMAL HIGH (ref 15–41)
Albumin: 2.6 g/dL — ABNORMAL LOW (ref 3.5–5.0)
Alkaline Phosphatase: 155 U/L — ABNORMAL HIGH (ref 38–126)
Anion gap: 7 (ref 5–15)
BUN: 24 mg/dL — ABNORMAL HIGH (ref 6–20)
CO2: 25 mmol/L (ref 22–32)
Calcium: 8.6 mg/dL — ABNORMAL LOW (ref 8.9–10.3)
Chloride: 100 mmol/L (ref 98–111)
Creatinine, Ser: 0.65 mg/dL (ref 0.61–1.24)
GFR, Estimated: >60 mL/min (ref >60)
Glucose, Bld: 174 mg/dL — ABNORMAL HIGH (ref 70–99)
Potassium: 4.5 mmol/L (ref 3.5–5.1)
Sodium: 132 mmol/L — ABNORMAL LOW (ref 135–145)
Total Bilirubin: 1.9 mg/dL — ABNORMAL HIGH (ref 0.3–1.2)
Total Protein: 5.6 g/dL — ABNORMAL LOW (ref 6.5–8.1)

## 2021-12-28 LAB — CBC
HCT: 36.1 % — ABNORMAL LOW (ref 39.0–52.0)
Hemoglobin: 12.6 g/dL — ABNORMAL LOW (ref 13.0–17.0)
MCH: 33.6 pg (ref 26.0–34.0)
MCHC: 34.9 g/dL (ref 30.0–36.0)
MCV: 96.3 fL (ref 80.0–100.0)
Platelets: 211 10*3/uL (ref 150–400)
RBC: 3.75 MIL/uL — ABNORMAL LOW (ref 4.22–5.81)
RDW: 16.7 % — ABNORMAL HIGH (ref 11.5–15.5)
WBC: 15 10*3/uL — ABNORMAL HIGH (ref 4.0–10.5)
nRBC: 0 % (ref 0.0–0.2)

## 2021-12-28 LAB — MAGNESIUM: Magnesium: 1.9 mg/dL (ref 1.7–2.4)

## 2021-12-28 MED ORDER — FUROSEMIDE 10 MG/ML IJ SOLN
40.0000 mg | Freq: Every day | INTRAMUSCULAR | Status: DC
  Administered 2021-12-28 – 2021-12-30 (×3): 40 mg via INTRAVENOUS
  Filled 2021-12-28 (×3): qty 4

## 2021-12-28 MED ORDER — ALBUTEROL SULFATE (2.5 MG/3ML) 0.083% IN NEBU
INHALATION_SOLUTION | RESPIRATORY_TRACT | Status: AC
  Administered 2021-12-28: 2.5 mg
  Filled 2021-12-28: qty 3

## 2021-12-28 MED ORDER — IPRATROPIUM-ALBUTEROL 0.5-2.5 (3) MG/3ML IN SOLN
3.0000 mL | Freq: Four times a day (QID) | RESPIRATORY_TRACT | Status: DC
  Administered 2021-12-28 – 2021-12-29 (×7): 3 mL via RESPIRATORY_TRACT
  Filled 2021-12-28 (×7): qty 3

## 2021-12-28 NOTE — Progress Notes (Signed)
PROGRESS NOTE    Francisco Robinson  PJK:[id number] DOB: 12/24/1960 DOA: 12/12/2021 PCP: Carrolyn Meiers, MD    Brief Narrative:   Francisco Robinson is a 61 y.o. male with past medical history of COPD, type 2 diabetes, systolic congestive heart failure, history of tobacco and alcohol abuse with previous admissions to the hospital presented to the hospital with shortness of breath, increased leg swelling.  He was recently admitted between 5 11-5/ 15 for acute on chronic combined systolic and diastolic heart failure but since discharge he has been having increasing shortness of breath leg swelling and abdominal distention so he decided come to the hospital.  In the ED, patient was noted to be tachypneic and tachycardic.  Labs showed mild leukocytosis in BMP with hyponatremia.  D-dimer was elevated at 5.9.  UA was negative for UTI.  Chest x-ray showed no acute abnormality and improved aeration.  Patient was given breathing treatment and was admitted to hospital for further evaluation and treatment.    Assessment and plan  Principal Problem:   COPD exacerbation ([place]) Active Problems:   Hyponatremia   History of ETOH abuse   Tobacco abuse   Essential hypertension   Leukocytosis   Elevated d-dimer   Fall   Acute exacerbation of chronic obstructive pulmonary disease (COPD) (HCC)   Acute exacerbation of COPD Continue frequent nebulizers, Solu-Medrol, azithromycin.   Continue incentive spirometry and flutter valve.  Continue diuretics.  Acute on Chronic combined systolic and diastolic CHF 2D echocardiogram done in January 2023 showed LVEF of 30% severe hypokinesis of the inferior, distal, anterior, apical, distal anterior, septal walls.  Mild LVH.  LV diastolic parameters were indeterminate.  Chest x-ray without any obvious congestion.  BNP elevated> 4,500 (same as 5/15-day of discharge).  Continue intake and output charting, Daily weights Lasix IV 40 twice daily.  Pending 2D echocardiogram.   Patient is at very high risk of recurrent admission to the hospital due to ongoing noncompliance with fluid intake, alcohol ingestion smoking.  Positive balance for 440 mL at this time.  We will add a strict intake and output.  Patient is very noncompliant with medications/diet as he continues to drink alcohol.  Has had multiple admissions for the same.  Overall prognosis for the patient is poor.  Alcohol withdrawal/alcohol abuse History of drinking 12 packs of beer every day.  Had signs of mild alcohol withdrawal on presentation.  Continue CIWA protocol thiamine folic acid multivitamin.  Calm today.  Chronic hyponatremia possibly secondary to beer potomania/diuretic use Na 128 on presentation.  Has improved to 132 today.  We will continue to monitor.  Mild hypokalemia improved with replacement.  Potassium of 4.5 today.  Leukocytosis possibly reactive.  Latest WBC at 15.0.  On steroids as well.  We will continue to monitor.  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  Recommended annual imaging.  Large right and small left pleural effusion with spiculated opacity in the right apex.  We will continue with IV fluids.  This will need to be followed up as outpatient.  Continue diuretics.   Mechanical fall Had a fall in the ED as well.  CT head unremarkable.  CT C-spine without any acute findings. Continue fall precautions, PT, OT evaluation pending..   Type 2 diabetes mellitus Hemoglobin A1c as of 9 days ago was 5.4, continue sliding scale insulin as necessary.     History of CAD  Continue aspirin, statin, imdur, Coreg .  No chest pain at this time.   Essential hypertension Continue Coreg blood pressure seems to be stable at this time.  Deconditioning debility frailty.  Due to advanced heart failure noncompliance ongoing alcohol abuse.  We will get PT evaluation.  Tobacco abuse Continue  nicotine patch.     DVT prophylaxis: enoxaparin (LOVENOX) injection 40 mg Start: 12/27/21 1000 SCDs Start: 12/27/21 1610   Code Status:     Code Status: Full Code  Disposition: Home likely in 2 to 3 days  Status is: Inpatient  Remains inpatient appropriate because: Decompensated heart failure, COPD exacerbation,   Family Communication: Communicated with the patient at bedside  Consultants:  None  Procedures:  None  Antimicrobials:  Azithromycin  Anti-infectives (From admission, onward)    Start     Dose/Rate Route Frequency Ordered Stop   12/28/21 1000  azithromycin (ZITHROMAX) tablet 250 mg       See Hyperspace for full Linked Orders Report.   250 mg Oral Daily 12/27/21 0222 01/01/22 0959   12/27/21 1000  azithromycin (ZITHROMAX) tablet 500 mg       See Hyperspace for full Linked Orders Report.   500 mg Oral Daily 12/27/21 0222 12/27/21 0901     Subjective: Today, patient was seen and examined at bedside.  Patient complains of cough, congestion, shortness of breath and dyspnea.  Appears to be calm today with intermittent somnolence   Objective: Vitals:   12/28/21 0738 12/28/21 0800 12/28/21 0900 12/28/21 0920  BP:  103/87 104/84   Pulse:  96 94 100  Resp:  (!) 21 (!) 24 (!) 23  Temp: (!) 97.3 F (36.3 C)     TempSrc: Oral     SpO2:  96% 96% 93%  Weight:      Height:        Intake/Output Summary (Last 24 hours) at 12/28/2021 1118 Last data filed at 12/27/2021 2300 Gross per 24 hour  Intake 440 ml  Output --  Net 440 ml   Filed Weights   12/29/2021 2252 12/27/21 0023 12/28/21 0500  Weight: 61.1 kg 68 kg 64.7 kg    Physical Examination: Body mass index is 22.34 kg/m.   General:  Average built, not in obvious distress, appears older than stated age, frail debilitated male, on nasal cannula oxygen, chronically ill appearing HENT:   No scleral pallor or icterus noted. Oral mucosa is moist.  Chest:  Diminished breath sounds bilaterally.  Coarse breath  sounds noted. CVS: S1 &S2 heard. No murmur.  Regular rate and rhythm. Abdomen: Soft, nontender, nondistended.  Bowel sounds are heard.   Extremities: No cyanosis, clubbing with bilateral lower extremity edema, peripheral pulses are palpable. Psych: Alert, awake and oriented, normal mood CNS:  No cranial nerve deficits.  Moves all the extremities. Skin: Warm and dry.  No rashes noted.  Data Reviewed:   CBC: Recent Labs  Lab 12/25/2021 2303 12/27/21 0238 12/28/21 0403  WBC 11.3* 9.9 15.0*  HGB 12.1* 12.4* 12.6*  HCT 34.3* 36.8* 36.1*  MCV 95.3 98.1 96.3  PLT 166 172 960    Basic Metabolic Panel: Recent Labs  Lab 12/22/21 0421 12/09/2021 2303 12/27/21 0238 12/28/21 0403  NA 130* 128* 129* 132*  K 3.8 3.6 3.3* 4.5  CL 98 96* 96* 100  CO2 27 21* 23 25  GLUCOSE 127* 93 138* 174*  BUN '17 18 18 '$ 24*  CREATININE 0.53* 0.59* 0.54* 0.65  CALCIUM 8.2* 8.3* 8.3* 8.6*  MG  2.0  --  1.8 1.9  PHOS  --   --  3.6  --     Liver Function Tests: Recent Labs  Lab 12/27/21 0238 12/28/21 0403  AST 181* 105*  ALT 96* 71*  ALKPHOS 196* 155*  BILITOT 3.4* 1.9*  PROT 6.5 5.6*  ALBUMIN 3.1* 2.6*     Radiology Studies: CT Head Wo Contrast  Result Date: 12/27/2021 CLINICAL DATA:  Fall with facial and head trauma. EXAM: CT HEAD WITHOUT CONTRAST CT MAXILLOFACIAL WITHOUT CONTRAST CT CERVICAL SPINE WITHOUT CONTRAST TECHNIQUE: Multidetector CT imaging of the head, cervical spine, and maxillofacial structures were performed using the standard protocol without intravenous contrast. Multiplanar CT image reconstructions of the cervical spine and maxillofacial structures were also generated. RADIATION DOSE REDUCTION: This exam was performed according to the departmental dose-optimization program which includes automated exposure control, adjustment of the mA and/or kV according to patient size and/or use of iterative reconstruction technique. COMPARISON:  None Available. FINDINGS: CT HEAD FINDINGS Brain:  Patient motion limits the study particularly through the middle and posterior fossae. There is moderate age-advanced cerebral atrophy, small-vessel disease and atrophic ventriculomegaly, mild cerebellar atrophy. Allowing for motion no obvious acute infarct, hemorrhage or mass are seen and there is no midline shift. Basal cisterns are clear. Vascular: There are scattered calcifications in the carotid siphons but no hyperdense central vessels. Skull: The calvarium and skull base are intact. No focal skull lesion is seen. There is asymmetric swelling of the right temporal and anterior right frontal scalp. Other: Visualized mastoid air cells are clear. There is pneumatization of both petrous apices. CT MAXILLOFACIAL FINDINGS Osseous: No fracture or mandibular dislocation. No destructive process. The patient is edentulous. Orbits: There is a shallow depressed fracture of the medial wall of the left orbit which is most likely chronic because there is no soft tissue reaction or adjacent left ethmoid sinus opacification. No acute traumatic findings seen in either orbit. Right globe is unremarkable. 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 Sinuses: Clear. There is an S shaped nasal septum with prominent left-sided septal spurring. There is a concha bullosa of the right middle turbinate and of the left superior turbinate. The nasal passages are unobstructed. Soft tissues: Negative. CT CERVICAL SPINE FINDINGS Two attempts were made at imaging.  Both are motion limited. Alignment: There is a mild cervical levoscoliosis. There is a minimal grade 1 anterolisthesis of C4 on 5 probably degenerative. No traumatic listhesis is seen. The alignment is otherwise normal. Skull base and vertebrae: There is mild osteopenia. No spinal compression injury is seen.  There is no obvious displaced fracture allowing for motion. A subtle fracture would be missed. No focal bone lesion is seen through the motion artifact. Soft tissues and spinal canal: No prevertebral fluid or swelling. No obvious visible canal hematoma. Proximal right cervical ICA appears to be heavily calcified but is not well seen. Disc levels: There is mild disc space loss, with bidirectional endplate osteophytes at C4-5, C5-6 and C6-7 mildly encroaching on the ventral cord surface without frank compression. There is multilevel facet joint and uncinate hypertrophy. Acquired degenerative foraminal stenosis is mild-to-moderate on the left at C3-4, bilaterally severe at C4-5, bilaterally mild C5-6 and moderate to severe on the left and mild on the right at C6-7. Upper chest: There is a moderate layering right pleural effusion. There is a spiculated  lesion measuring 2.5 x 1.4 cm in the right lung apex. Other: None. IMPRESSION: 1. All studies are motion limited to varying degrees. 2. No acute intracranial CT findings or depressed skull fracture allowing for motion. 3. 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 4. Clear sinuses with S shaped nasal septum with left-sided spurring. 5. Osteopenia, scoliosis and degenerative changes of the cervical spine without evidence of fractures allowing for motion. 6. Irregular spiculated lesion in the right lung apex. Further evaluation recommended. Moderate right pleural effusion. Electronically Signed   By: Telford Nab M.D.   On: 12/27/2021 04:12   CT Angio Chest PE W and/or Wo Contrast  Result Date: 12/28/2021 CLINICAL DATA:  Pulmonary embolism (PE) suspected, positive D-dimer. Dyspnea, productive cough EXAM: CT ANGIOGRAPHY CHEST WITH CONTRAST TECHNIQUE: Multidetector CT imaging of the chest was performed using the standard protocol during bolus administration of intravenous contrast. Multiplanar CT image reconstructions and MIPs  were obtained to evaluate the vascular anatomy. RADIATION DOSE REDUCTION: This exam was performed according to the departmental dose-optimization program which includes automated exposure control, adjustment of the mA and/or kV according to patient size and/or use of iterative reconstruction technique. CONTRAST:  46m OMNIPAQUE IOHEXOL 350 MG/ML SOLN COMPARISON:  10/07/2021 FINDINGS: Cardiovascular: There is adequate opacification of the pulmonary arterial tree. No intraluminal filling defect identified to suggest acute pulmonary embolism. The central pulmonary arteries are enlarged in keeping with changes of pulmonary arterial hypertension. Mild multi-vessel coronary artery calcification. Mild global cardiomegaly. Calcification of the aortic valve leaflets noted. No pericardial effusion. Mild atherosclerotic calcification noted within the thoracic aorta. The ascending aorta is mildly dilated measuring 4.2 cm in greatest dimension. The descending thoracic aorta is of normal caliber. Mediastinum/Nodes: No enlarged mediastinal, hilar, or axillary lymph nodes. Thyroid gland, trachea, and esophagus demonstrate no significant findings. Lungs/Pleura: Motion artifact limits evaluation of the pulmonary parenchyma. Moderate right and small left pleural effusions have developed with associated bibasilar compressive atelectasis. Spiculated opacity has developed within the right apex measuring roughly 12 x 24 mm at axial image # 39/7, possibly infectious or inflammatory in the acute setting. Previously noted ground-glass pulmonary infiltrates within the left upper lobe have resolved. No pneumothorax. No central obstructing lesion. Upper Abdomen: No acute abnormality. Musculoskeletal: No acute bone abnormality. Osseous structures are age-appropriate though evaluation is slightly limited by motion artifact. Review of the MIP images confirms the above findings. IMPRESSION: No pulmonary embolism. Mild coronary artery calcification.  Mild global cardiomegaly. Morphologic changes in keeping with pulmonary arterial hypertension. 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 Recommend annual imaging followup by CTA or MRA. This recommendation follows 2010 ACCF/AHA/AATS/ACR/ASA/SCA/SCAI/SIR/STS/SVM Guidelines for the Diagnosis and Management of Patients with Thoracic Aortic Disease. Circulation. 2010; 121:: W237-S283 Aortic aneurysm NOS (ICD10-I71.9) Large right and small left pleural effusions. Spiculated opacity within the right apex, new since prior examination, possibly infectious or inflammatory in the acute setting. Short-term follow-up evaluation in 3 months, once the patient's acute issues have resolved, would be helpful in documenting resolution. Aortic Atherosclerosis (ICD10-I70.0). Electronically Signed   By: AFidela SalisburyM.D.   On: 12/28/2021 00:43   CT Cervical Spine Wo Contrast  Result Date: 12/27/2021 CLINICAL DATA:  Fall with facial and head trauma. EXAM: CT HEAD WITHOUT CONTRAST CT MAXILLOFACIAL WITHOUT CONTRAST CT CERVICAL SPINE WITHOUT CONTRAST TECHNIQUE: Multidetector CT imaging of the head, cervical spine, and maxillofacial structures were performed using the standard protocol without intravenous contrast.  Multiplanar CT image reconstructions of the cervical spine and maxillofacial structures were also generated. RADIATION DOSE REDUCTION: This exam was performed according to the departmental dose-optimization program which includes automated exposure control, adjustment of the mA and/or kV according to patient size and/or use of iterative reconstruction technique. COMPARISON:  None Available. FINDINGS: CT HEAD FINDINGS Brain: Patient motion limits the study particularly through the middle and posterior fossae. There is moderate age-advanced cerebral atrophy, small-vessel disease and atrophic ventriculomegaly, mild cerebellar atrophy. Allowing for motion no obvious  acute infarct, hemorrhage or mass are seen and there is no midline shift. Basal cisterns are clear. Vascular: There are scattered calcifications in the carotid siphons but no hyperdense central vessels. Skull: The calvarium and skull base are intact. No focal skull lesion is seen. There is asymmetric swelling of the right temporal and anterior right frontal scalp. Other: Visualized mastoid air cells are clear. There is pneumatization of both petrous apices. CT MAXILLOFACIAL FINDINGS Osseous: No fracture or mandibular dislocation. No destructive process. The patient is edentulous. Orbits: There is a shallow depressed fracture of the medial wall of the left orbit which is most likely chronic because there is no soft tissue reaction or adjacent left ethmoid sinus opacification. No acute traumatic findings seen in either orbit. Right globe is unremarkable. 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 Sinuses: Clear. There is an S shaped nasal septum with prominent left-sided septal spurring. There is a concha bullosa of the right middle turbinate and of the left superior turbinate. The nasal passages are unobstructed. Soft tissues: Negative. CT CERVICAL SPINE FINDINGS Two attempts were made at imaging.  Both are motion limited. Alignment: There is a mild cervical levoscoliosis. There is a minimal grade 1 anterolisthesis of C4 on 5 probably degenerative. No traumatic listhesis is seen. The alignment is otherwise normal. Skull base and vertebrae: There is mild osteopenia. No spinal compression injury is seen. There is no obvious displaced fracture allowing for motion. A subtle fracture would be missed. No focal bone lesion is seen through the motion artifact. Soft tissues and spinal canal: No prevertebral fluid or swelling. No obvious visible canal  hematoma. Proximal right cervical ICA appears to be heavily calcified but is not well seen. Disc levels: There is mild disc space loss, with bidirectional endplate osteophytes at C4-5, C5-6 and C6-7 mildly encroaching on the ventral cord surface without frank compression. There is multilevel facet joint and uncinate hypertrophy. Acquired degenerative foraminal stenosis is mild-to-moderate on the left at C3-4, bilaterally severe at C4-5, bilaterally mild C5-6 and moderate to severe on the left and mild on the right at C6-7. Upper chest: There is a moderate layering right pleural effusion. There is a spiculated lesion measuring 2.5 x 1.4 cm in the right lung apex. Other: None. IMPRESSION: 1. All studies are motion limited to varying degrees. 2. No acute intracranial CT findings or depressed skull fracture allowing for motion. 3. 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 4. Clear sinuses with S shaped nasal septum with left-sided spurring. 5. Osteopenia, scoliosis and degenerative changes of the cervical spine without evidence of fractures allowing for motion. 6. Irregular spiculated lesion in the right lung apex. Further evaluation recommended. Moderate right pleural effusion. Electronically Signed   By: Telford Nab M.D.   On: 12/27/2021 04:12   DG Chest [facility] [address]  Result Date: 12/16/2021 CLINICAL DATA:  Shortness of breath EXAM: PORTABLE CHEST 1 VIEW COMPARISON:  12/21/2021 FINDINGS: Cardiac shadow is enlarged but stable. Aortic calcifications are again seen. Interval re-expansion of the left lower lobe is noted. No focal infiltrate is seen. Mild interstitial changes are again noted. No bony abnormality is seen. IMPRESSION: No acute abnormality noted. Improved aeration in the left base is noted. Electronically Signed   By: Inez Catalina M.D.   On: 12/25/2021 23:45   CT Maxillofacial Wo Contrast  Result Date: 12/27/2021 CLINICAL DATA:  Fall with facial and head  trauma. EXAM: CT HEAD WITHOUT CONTRAST CT MAXILLOFACIAL WITHOUT CONTRAST CT CERVICAL SPINE WITHOUT CONTRAST TECHNIQUE: Multidetector CT imaging of the head, cervical spine, and maxillofacial structures were performed using the standard protocol without intravenous contrast. Multiplanar CT image reconstructions of the cervical spine and maxillofacial structures were also generated. RADIATION DOSE REDUCTION: This exam was performed according to the departmental dose-optimization program which includes automated exposure control, adjustment of the mA and/or kV according to patient size and/or use of iterative reconstruction technique. COMPARISON:  None Available. FINDINGS: CT HEAD FINDINGS Brain: Patient motion limits the study particularly through the middle and posterior fossae. There is moderate age-advanced cerebral atrophy, small-vessel disease and atrophic ventriculomegaly, mild cerebellar atrophy. Allowing for motion no obvious acute infarct, hemorrhage or mass are seen and there is no midline shift. Basal cisterns are clear. Vascular: There are scattered calcifications in the carotid siphons but no hyperdense central vessels. Skull: The calvarium and skull base are intact. No focal skull lesion is seen. There is asymmetric swelling of the right temporal and anterior right frontal scalp. Other: Visualized mastoid air cells are clear. There is pneumatization of both petrous apices. CT MAXILLOFACIAL FINDINGS Osseous: No fracture or mandibular dislocation. No destructive process. The patient is edentulous. Orbits: There is a shallow depressed fracture of the medial wall of the left orbit which is most likely chronic because there is no soft tissue reaction or adjacent left ethmoid sinus opacification. No acute traumatic findings seen in either orbit. Right globe is unremarkable. 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 Sinuses: Clear. There is an S shaped nasal septum with prominent left-sided septal spurring. There is a concha bullosa of the right middle turbinate and of the left superior turbinate. The nasal passages are unobstructed. Soft tissues: Negative. CT CERVICAL SPINE FINDINGS Two attempts were made at imaging.  Both are motion limited. Alignment: There is a mild cervical levoscoliosis. There is a minimal grade 1 anterolisthesis of C4 on 5 probably degenerative. No traumatic listhesis is seen. The alignment is otherwise normal. Skull base and vertebrae: There is mild osteopenia. No spinal compression injury is seen. There is no obvious displaced fracture allowing for motion. A subtle fracture would be missed. No focal bone lesion is seen through the motion artifact. Soft tissues and spinal canal: No prevertebral fluid or swelling. No obvious visible canal hematoma. Proximal right cervical ICA appears to be heavily calcified but is not well seen. Disc levels: There is mild disc space loss, with bidirectional endplate osteophytes at C4-5, C5-6 and C6-7 mildly encroaching on the ventral cord surface without frank compression. There is multilevel facet joint and uncinate hypertrophy. Acquired degenerative foraminal stenosis is mild-to-moderate on the left at C3-4, bilaterally severe at C4-5, bilaterally mild C5-6 and moderate to severe on the left  and mild on the right at C6-7. Upper chest: There is a moderate layering right pleural effusion. There is a spiculated lesion measuring 2.5 x 1.4 cm in the right lung apex. Other: None. IMPRESSION: 1. All studies are motion limited to varying degrees. 2. No acute intracranial CT findings or depressed skull fracture allowing for motion. 3. 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 4. Clear sinuses with S shaped nasal septum with left-sided spurring. 5.  Osteopenia, scoliosis and degenerative changes of the cervical spine without evidence of fractures allowing for motion. 6. Irregular spiculated lesion in the right lung apex. Further evaluation recommended. Moderate right pleural effusion. Electronically Signed   By: Telford Nab M.D.   On: 12/27/2021 04:12      LOS: 1 day    Flora Lipps, MD [facility] Available via Epic secure chat 7am-7pm After these hours, please refer to coverage provider listed on [email or web address] 12/28/2021, 11:18 AM

## 2021-12-28 NOTE — Progress Notes (Signed)
  Echocardiogram 2D Echocardiogram has been performed.  Francisco Robinson 12/28/2021, 12:50 PM

## 2021-12-28 NOTE — Progress Notes (Signed)
Checked on patient at 0240 , patient asleep , calm sedated , did not treat with neb or awaken.

## 2021-12-29 ENCOUNTER — Inpatient Hospital Stay (HOSPITAL_COMMUNITY): Payer: Medicaid Other

## 2021-12-29 ENCOUNTER — Inpatient Hospital Stay: Payer: Self-pay

## 2021-12-29 DIAGNOSIS — J9601 Acute respiratory failure with hypoxia: Secondary | ICD-10-CM | POA: Diagnosis not present

## 2021-12-29 DIAGNOSIS — I469 Cardiac arrest, cause unspecified: Secondary | ICD-10-CM | POA: Diagnosis not present

## 2021-12-29 DIAGNOSIS — I952 Hypotension due to drugs: Secondary | ICD-10-CM

## 2021-12-29 DIAGNOSIS — T17908A Unspecified foreign body in respiratory tract, part unspecified causing other injury, initial encounter: Secondary | ICD-10-CM | POA: Diagnosis not present

## 2021-12-29 DIAGNOSIS — R7989 Other specified abnormal findings of blood chemistry: Secondary | ICD-10-CM | POA: Diagnosis not present

## 2021-12-29 DIAGNOSIS — J441 Chronic obstructive pulmonary disease with (acute) exacerbation: Secondary | ICD-10-CM | POA: Diagnosis not present

## 2021-12-29 DIAGNOSIS — I10 Essential (primary) hypertension: Secondary | ICD-10-CM | POA: Diagnosis not present

## 2021-12-29 DIAGNOSIS — W19XXXA Unspecified fall, initial encounter: Secondary | ICD-10-CM | POA: Diagnosis not present

## 2021-12-29 LAB — GLUCOSE, CAPILLARY
Glucose-Capillary: 204 mg/dL — ABNORMAL HIGH (ref 70–99)
Glucose-Capillary: 209 mg/dL — ABNORMAL HIGH (ref 70–99)
Glucose-Capillary: 168 mg/dL — ABNORMAL HIGH (ref 70–99)

## 2021-12-29 LAB — BASIC METABOLIC PANEL
Anion gap: 7 (ref 5–15)
BUN: 28 mg/dL — ABNORMAL HIGH (ref 6–20)
CO2: 25 mmol/L (ref 22–32)
Calcium: 8.8 mg/dL — ABNORMAL LOW (ref 8.9–10.3)
Chloride: 100 mmol/L (ref 98–111)
Creatinine, Ser: 0.81 mg/dL (ref 0.61–1.24)
GFR, Estimated: >60 mL/min (ref >60)
Glucose, Bld: 163 mg/dL — ABNORMAL HIGH (ref 70–99)
Potassium: 4.4 mmol/L (ref 3.5–5.1)
Sodium: 132 mmol/L — ABNORMAL LOW (ref 135–145)

## 2021-12-29 LAB — BLOOD GAS, ARTERIAL
Acid-base deficit: 1.8 mmol/L (ref 0.0–2.0)
Bicarbonate: 23.1 mmol/L (ref 20.0–28.0)
Drawn by: 41977
FIO2: 100 %
O2 Saturation: 99 %
Patient temperature: 37
pCO2 arterial: 39 mmHg (ref 32–48)
pH, Arterial: 7.38 (ref 7.35–7.45)
pO2, Arterial: 288 mmHg — ABNORMAL HIGH (ref 83–108)

## 2021-12-29 LAB — CBC
HCT: 34.2 % — ABNORMAL LOW (ref 39.0–52.0)
Hemoglobin: 11.5 g/dL — ABNORMAL LOW (ref 13.0–17.0)
MCH: 33.1 pg (ref 26.0–34.0)
MCHC: 33.6 g/dL (ref 30.0–36.0)
MCV: 98.6 fL (ref 80.0–100.0)
Platelets: 189 10*3/uL (ref 150–400)
RBC: 3.47 MIL/uL — ABNORMAL LOW (ref 4.22–5.81)
RDW: 17.2 % — ABNORMAL HIGH (ref 11.5–15.5)
WBC: 22.6 10*3/uL — ABNORMAL HIGH (ref 4.0–10.5)
nRBC: 0 % (ref 0.0–0.2)

## 2021-12-29 LAB — MAGNESIUM: Magnesium: 2 mg/dL (ref 1.7–2.4)

## 2021-12-29 MED ORDER — MIDAZOLAM HCL 2 MG/2ML IJ SOLN: 1.0000 mg | INTRAMUSCULAR | Status: DC | PRN

## 2021-12-29 MED ORDER — HALOPERIDOL LACTATE 5 MG/ML IJ SOLN
2.0000 mg | Freq: Once | INTRAMUSCULAR | Status: AC
  Administered 2021-12-29: 2 mg via INTRAMUSCULAR
  Filled 2021-12-29: qty 1

## 2021-12-29 MED ORDER — ETOMIDATE 2 MG/ML IV SOLN
INTRAVENOUS | Status: AC
  Administered 2021-12-29: 20 mg
  Filled 2021-12-29: qty 10

## 2021-12-29 MED ORDER — POLYETHYLENE GLYCOL 3350 17 G PO PACK
17.0000 g | PACK | Freq: Every day | ORAL | Status: DC
  Administered 2021-12-30: 17 g
  Filled 2021-12-29: qty 1

## 2021-12-29 MED ORDER — ADULT MULTIVITAMIN W/MINERALS CH
1.0000 | ORAL_TABLET | Freq: Every day | ORAL | Status: DC
  Administered 2021-12-30: 1
  Filled 2021-12-29: qty 1

## 2021-12-29 MED ORDER — FENTANYL CITRATE PF 50 MCG/ML IJ SOSY: 50.0000 ug | PREFILLED_SYRINGE | INTRAMUSCULAR | Status: DC | PRN

## 2021-12-29 MED ORDER — IPRATROPIUM-ALBUTEROL 0.5-2.5 (3) MG/3ML IN SOLN
3.0000 mL | Freq: Three times a day (TID) | RESPIRATORY_TRACT | Status: DC
  Administered 2021-12-30: 3 mL via RESPIRATORY_TRACT
  Filled 2021-12-29: qty 3

## 2021-12-29 MED ORDER — SODIUM CHLORIDE 0.9 % IV SOLN: INTRAVENOUS | Status: DC | PRN

## 2021-12-29 MED ORDER — ASPIRIN 81 MG PO CHEW
81.0000 mg | CHEWABLE_TABLET | Freq: Every day | ORAL | Status: DC
  Administered 2021-12-30: 81 mg
  Filled 2021-12-29: qty 1

## 2021-12-29 MED ORDER — FENTANYL BOLUS VIA INFUSION
50.0000 ug | INTRAVENOUS | Status: DC | PRN
  Administered 2021-12-30 – 2021-12-31 (×3): 50 ug via INTRAVENOUS

## 2021-12-29 MED ORDER — CHLORHEXIDINE GLUCONATE 0.12% ORAL RINSE (MEDLINE KIT)
15.0000 mL | Freq: Two times a day (BID) | OROMUCOSAL | Status: DC
  Administered 2021-12-29 – 2022-01-02 (×8): 15 mL via OROMUCOSAL

## 2021-12-29 MED ORDER — SODIUM CHLORIDE 0.9 % IV SOLN
3.0000 g | Freq: Four times a day (QID) | INTRAVENOUS | Status: DC
  Administered 2021-12-29 – 2021-12-30 (×4): 3 g via INTRAVENOUS
  Filled 2021-12-29 (×8): qty 8

## 2021-12-29 MED ORDER — DOCUSATE SODIUM 50 MG/5ML PO LIQD
100.0000 mg | Freq: Two times a day (BID) | ORAL | Status: DC
  Administered 2021-12-29 – 2021-12-30 (×2): 100 mg
  Filled 2021-12-29 (×2): qty 10

## 2021-12-29 MED ORDER — FOLIC ACID 1 MG PO TABS
1.0000 mg | ORAL_TABLET | Freq: Every day | ORAL | Status: DC
  Administered 2021-12-30: 1 mg
  Filled 2021-12-29: qty 1

## 2021-12-29 MED ORDER — FENTANYL CITRATE (PF) 100 MCG/2ML IJ SOLN
INTRAMUSCULAR | Status: AC
  Administered 2021-12-29: 100 ug
  Filled 2021-12-29: qty 2

## 2021-12-29 MED ORDER — ATORVASTATIN CALCIUM 40 MG PO TABS
40.0000 mg | ORAL_TABLET | Freq: Every day | ORAL | Status: DC
  Administered 2021-12-30: 40 mg
  Filled 2021-12-29: qty 1

## 2021-12-29 MED ORDER — THIAMINE HCL 100 MG PO TABS: 100.0000 mg | ORAL_TABLET | Freq: Every day | ORAL | Status: DC

## 2021-12-29 MED ORDER — IPRATROPIUM-ALBUTEROL 0.5-2.5 (3) MG/3ML IN SOLN
3.0000 mL | Freq: Four times a day (QID) | RESPIRATORY_TRACT | Status: DC | PRN
  Administered 2021-12-30: 3 mL via RESPIRATORY_TRACT
  Filled 2021-12-29: qty 3

## 2021-12-29 MED ORDER — ROCURONIUM BROMIDE 10 MG/ML (PF) SYRINGE
PREFILLED_SYRINGE | INTRAVENOUS | Status: AC
Start: 2021-12-29 — End: 2021-12-29
  Administered 2021-12-29: 50 mg
  Filled 2021-12-29: qty 10

## 2021-12-29 MED ORDER — THIAMINE HCL 100 MG/ML IJ SOLN
100.0000 mg | Freq: Every day | INTRAMUSCULAR | Status: DC
  Administered 2021-12-30: 100 mg via INTRAVENOUS
  Filled 2021-12-29: qty 2

## 2021-12-29 MED ORDER — FENTANYL CITRATE PF 50 MCG/ML IJ SOSY: 50.0000 ug | PREFILLED_SYRINGE | Freq: Once | INTRAMUSCULAR | Status: DC

## 2021-12-29 MED ORDER — FENTANYL 2500MCG IN NS 250ML (10MCG/ML) PREMIX INFUSION
100.0000 ug/h | INTRAVENOUS | Status: DC
  Administered 2021-12-29: 50 ug/h via INTRAVENOUS
  Administered 2021-12-31 – 2022-01-01 (×2): 100 ug/h via INTRAVENOUS
  Filled 2021-12-29 (×3): qty 250

## 2021-12-29 MED ORDER — NOREPINEPHRINE 4 MG/250ML-% IV SOLN
0.0000 ug/min | INTRAVENOUS | Status: DC
  Administered 2021-12-29: 5 ug/min via INTRAVENOUS
  Administered 2021-12-29: 2 ug/min via INTRAVENOUS
  Filled 2021-12-29: qty 250

## 2021-12-29 MED ORDER — MIDAZOLAM HCL 2 MG/2ML IJ SOLN: 2.0000 mg | INTRAMUSCULAR | Status: DC | PRN

## 2021-12-29 MED ORDER — NOREPINEPHRINE 4 MG/250ML-% IV SOLN
INTRAVENOUS | Status: AC
  Filled 2021-12-29: qty 250

## 2021-12-29 MED ORDER — GUAIFENESIN-DM 100-10 MG/5ML PO SYRP
10.0000 mL | ORAL_SOLUTION | Freq: Three times a day (TID) | ORAL | Status: DC
  Administered 2021-12-30: 10 mL
  Filled 2021-12-29: qty 10

## 2021-12-29 MED ORDER — MIDAZOLAM HCL 2 MG/2ML IJ SOLN
INTRAMUSCULAR | Status: AC
  Administered 2021-12-29: 1 mg
  Filled 2021-12-29: qty 2

## 2021-12-29 MED ORDER — ORAL CARE MOUTH RINSE
15.0000 mL | OROMUCOSAL | Status: DC
  Administered 2021-12-30 – 2022-01-02 (×35): 15 mL via OROMUCOSAL

## 2021-12-29 MED ORDER — MAGNESIUM OXIDE -MG SUPPLEMENT 400 (240 MG) MG PO TABS
400.0000 mg | ORAL_TABLET | Freq: Two times a day (BID) | ORAL | Status: DC
  Administered 2021-12-30: 400 mg
  Filled 2021-12-29: qty 1

## 2021-12-29 MED ORDER — ALBUTEROL SULFATE (2.5 MG/3ML) 0.083% IN NEBU
INHALATION_SOLUTION | RESPIRATORY_TRACT | Status: AC
  Administered 2021-12-29: 2.5 mg
  Filled 2021-12-29: qty 3

## 2021-12-29 MED ORDER — PANTOPRAZOLE 2 MG/ML SUSPENSION
40.0000 mg | Freq: Every day | ORAL | Status: DC
  Administered 2021-12-29: 40 mg
  Filled 2021-12-29: qty 20

## 2021-12-29 NOTE — Procedures (Signed)
Central Venous Catheter Insertion Procedure Note  Francisco Robinson  [id number]  11-22-1960  Date:12/29/21  Time:5:20 PM   Provider Performing:Tamala Manzer V. Elsworth Soho   Procedure: Insertion of Non-tunneled Central Venous [phone]) with US guidance (11173)   Indication(s) Medication administration and Difficult access  Consent Risks of the procedure as well as the alternatives and risks of each were explained to the patient and/or caregiver.  Consent for the procedure was obtained and is signed in the bedside chart  Anesthesia Topical only with 1% lidocaine   Timeout Verified patient identification, verified procedure, site/side was marked, verified correct patient position, special equipment/implants available, medications/allergies/relevant history reviewed, required imaging and test results available.  Sterile Technique Maximal sterile technique including full sterile barrier drape, hand hygiene, sterile gown, sterile gloves, mask, hair covering, sterile ultrasound probe cover (if used).  Procedure Description Area of catheter insertion was cleaned with chlorhexidine and draped in sterile fashion.  With real-time ultrasound guidance a central venous catheter was placed into the left internal jugular vein. Nonpulsatile blood flow and easy flushing noted in all ports.  The catheter was sutured in place and sterile dressing applied.  Complications/Tolerance None; patient tolerated the procedure well. Chest X-ray confirms placement EBL Minimal  Specimen(s) None  Francisco Robinson V. Elsworth Soho MD

## 2021-12-29 NOTE — Evaluation (Addendum)
Physical Therapy Evaluation Patient Details Name: Francisco Robinson MRN: [id number] DOB: February 07, 1961 Today's Date: 12/29/2021  History of Present Illness  Francisco Robinson is a 61 y.o. male with medical history significant of COPD, type 2 diabetes mellitus, systolic CHF, tobacco and alcohol abuse who presents to the emergency department due to worsening shortness of breath and increased leg swelling.   Patient was recently admitted from 5/11 to 5/15 due to acute on chronic combined systolic and diastolic CHF.  He states that since he was discharged from the hospital, he has been developing increasing shortness of breath, increased leg swelling and abdominal distention.  He complains of productive cough of yellow mucus, he continues to smoke and last alcohol was yesterday.  He denies chest pain, fever, chills, blood in sputum, nausea or vomiting   Clinical Impression  Patient is modified independent with supine to sit bed mobility with needing extended time to complete task. Patient was min/mod assist with sit to stand transfer with RW available but patient not relying on assistive device to complete task. Patient was min/mod assist with bed to chair transfer with RW and needing extended time to complete task. Patient was able to ambulate for 5 feet leaning on armrest of chair/hand held assist with fatigue and generalized weakness observed as primary limiting factors during assessment. Patient will benefit from continued skilled physical therapy in hospital and recommended venue below to increase strength, balance, endurance for safe ADLs and gait.      Recommendations for follow up therapy are one component of a multi-disciplinary discharge planning process, led by the attending physician.  Recommendations may be updated based on patient status, additional functional criteria and insurance authorization.  Follow Up Recommendations Skilled nursing-short term rehab (<3 hours/day)    Assistance  Recommended at Discharge Set up Supervision/Assistance  Patient can return home with the following  A little help with walking and/or transfers;Help with stairs or ramp for entrance;A little help with bathing/dressing/bathroom;Assistance with cooking/housework    Equipment Recommendations None recommended by PT  Recommendations for Other Services       Functional Status Assessment Patient has had a recent decline in their functional status and demonstrates the ability to make significant improvements in function in a reasonable and predictable amount of time.     Precautions / Restrictions Precautions Precautions: Fall Restrictions Weight Bearing Restrictions: No      Mobility  Bed Mobility Overal bed mobility: Modified Independent             General bed mobility comments: Patient is able to perform supine to sit with no assist but needed extended time to do so.    Transfers Overall transfer level: Needs assistance Equipment used: Rolling walker (2 wheels) Transfers: Sit to/from Stand, Bed to chair/wheelchair/BSC Sit to Stand: Min assist, Mod assist   Step pivot transfers: Min assist, Mod assist       General transfer comment: Patient was able to perform sit to stand and bed to chair transfers with min/mod assist with RW in step pivot fashion. Patient needed extended amount of time to complete transfers.    Ambulation/Gait Ambulation/Gait assistance: Min assist, Mod assist Gait Distance (Feet): 5 Feet Assistive device: hand held assist Gait Pattern/deviations: Decreased step length - right, Decreased step length - left, Decreased stride length Gait velocity: decreased     General Gait Details: Patient was able to ambualte for 5 feet with min/mod assist with RW.  Stairs  Wheelchair Mobility    Modified Rankin (Stroke Patients Only)       Balance Overall balance assessment: Needs assistance Sitting-balance support: Bilateral upper  extremity supported, Feet supported Sitting balance-Leahy Scale: Good Sitting balance - Comments: Patient able to sit EOB with no assist   Standing balance support: During functional activity, Bilateral upper extremity supported, Reliant on assistive device for balance Standing balance-Leahy Scale: Fair Standing balance comment: Patient demonstrates generalized weakness impacting ability to perform standing balance independently. RW utilized but patient not as reliant on assistive device.                             Pertinent Vitals/Pain Pain Assessment Pain Assessment: Faces Faces Pain Scale: Hurts a little bit    Home Living Family/patient expects to be discharged to:: Private residence Living Arrangements: Alone   Type of Home: Mobile home Home Access: Level entry       Home Layout: One level Home Equipment: Shower seat      Prior Function Prior Level of Function : Independent/Modified Independent;Needs assist       Physical Assist : ADLs (physical)   ADLs (physical): IADLs Mobility Comments: Patient reports being able to ambulate independently with intermittent use of cane ADLs Comments: Patient reports needing assistance from home aides that come by "every once in awhile"     Hand Dominance        Extremity/Trunk Assessment   Upper Extremity Assessment Upper Extremity Assessment: Overall WFL for tasks assessed    Lower Extremity Assessment Lower Extremity Assessment: Generalized weakness    Cervical / Trunk Assessment Cervical / Trunk Assessment: Normal  Communication   Communication: No difficulties  Cognition Arousal/Alertness: Awake/alert Behavior During Therapy: Restless, Agitated Overall Cognitive Status: Within Functional Limits for tasks assessed                                          General Comments      Exercises     Assessment/Plan    PT Assessment Patient needs continued PT services;All further PT  needs can be met in the next venue of care  PT Problem List Decreased strength;Decreased activity tolerance;Decreased balance;Decreased mobility;Decreased coordination       PT Treatment Interventions DME instruction;Gait training;Functional mobility training;Stair training;Therapeutic activities;Therapeutic exercise;Balance training    PT Goals (Current goals can be found in the Care Plan section)  Acute Rehab PT Goals Patient Stated Goal: return home PT Goal Formulation: With patient Time For Goal Achievement: 01/12/22 Potential to Achieve Goals: Fair    Frequency Min 3X/week     Co-evaluation               AM-PAC PT "6 Clicks" Mobility  Outcome Measure Help needed turning from your back to your side while in a flat bed without using bedrails?: None Help needed moving from lying on your back to sitting on the side of a flat bed without using bedrails?: None Help needed moving to and from a bed to a chair (including a wheelchair)?: A Little Help needed standing up from a chair using your arms (e.g., wheelchair or bedside chair)?: A Little Help needed to walk in hospital room?: A Lot Help needed climbing 3-5 steps with a railing? : A Lot 6 Click Score: 18    End of Session Equipment Utilized During Treatment: Gait belt  Activity Tolerance: Patient tolerated treatment well;Patient limited by fatigue Patient left: in chair;with nursing/sitter in room Nurse Communication: Mobility status PT Visit Diagnosis: Unsteadiness on feet (R26.81);Other abnormalities of gait and mobility (R26.89);Muscle weakness (generalized) (M62.81)    Time: 6725-5001 PT Time Calculation (min) (ACUTE ONLY): 30 min   Charges:   PT Evaluation $PT Eval Moderate Complexity: 1 Mod PT Treatments $Therapeutic Activity: 23-37 mins        12:19 PM, 12/29/21 Lestine Box, S/PT   During this treatment session, the therapist was present, participating in and directing the treatment.  1:45  PM, 12/29/21 Lonell Grandchild, MPT Physical Therapist with [facility] 336 [phone] office [phone] mobile phone

## 2021-12-29 NOTE — Procedures (Signed)
Bronchoscopy Procedure Note  Jmarion Christiano  [id number]  03-18-1961  Date:12/29/21  Time:3:08 PM   Provider Performing:Sandeep Radell V. Ettel Albergo   Procedure(s):  Flexible Bronchoscopy [phone])  Indication(s) Aspiration of food contenets & cardiac arrest  Consent Unable to obtain consent due to emergent nature of procedure.  Anesthesia Etomidate, versed, fentnayl   Time Out Verified patient identification, verified procedure, site/side was marked, verified correct patient position, special equipment/implants available, medications/allergies/relevant history reviewed, required imaging and test results available.   Sterile Technique Usual hand hygiene, masks, gowns, and gloves were used   Procedure Description Bronchoscope advanced through endotracheal tube and into airway.  Airways were examined down to subsegmental level with findings noted below.   Following diagnostic evaluation, Therapeutic aspiration performed in RLL bronchi - food material suctioned out >> yellow bean/corn  Findings: food material suctioned out from RLL >> yellow bean/corn   Complications/Tolerance None; patient tolerated the procedure well. Chest X-ray is not needed post procedure.   EBL Minimal   Specimen(s) NOne  Martavia Tye V. Elsworth Soho MD

## 2021-12-29 NOTE — Procedures (Signed)
Intubation Procedure Note  Khair Chasteen  [id number]  25-Oct-1960  Date:12/29/21  Time:3:07 PM   Provider Performing:Jourdyn Hasler V. Aarion Metzgar    Procedure: Intubation ([id number])  Indication(s) Respiratory Failure  Consent Unable to obtain consent due to emergent nature of procedure.   Anesthesia No meds during code   Time Out Verified patient identification, verified procedure, site/side was marked, verified correct patient position, special equipment/implants available, medications/allergies/relevant history reviewed, required imaging and test results available.   Sterile Technique Usual hand hygeine, masks, and gloves were used   Procedure Description Patient positioned in bed supine.  Sedation given as noted above.  Patient was intubated with endotracheal tube using  mac 4 laryngoscope .  View was Grade 2 only posterior commissure .  Number of attempts was 1.  Colorimetric CO2 detector was consistent with tracheal placement.   Complications/Tolerance None; patient tolerated the procedure well. Chest X-ray is ordered to verify placement.   EBL Minimal   Specimen(s) None   Jayln Madeira V. Elsworth Soho MD

## 2021-12-29 NOTE — Procedures (Signed)
Cardiopulmonary Resuscitation Note  Francisco Robinson  [id number]  19-Jun-1961  Date:12/29/21  Time:3:19 PM   Provider Performing:Kryssa Risenhoover V. Delan Ksiazek   Procedure: Cardiopulmonary Resuscitation [phone])  Indication(s) Loss of Pulse  Consent N/A  Anesthesia N/A   Time Out N/A   Sterile Technique Hand hygiene, gloves   Procedure Description Called to patient's room for CODE BLUE. Initial rhythm was PEA/Asystole. Patient received high quality chest compressions for 2 minutes .Epinephrine was administered x 1as directed by time keeper. Additional pharmacologic interventions included Additional procedural interventions include intubation.  Return of spontaneous circulation was achieved.  Family to be notified.   Complications/Tolerance N/A   EBL N/A   Specimen(s) N/A  Estimated time to ROSC: 2-4 minutes  Francisco Robinson V. Elsworth Soho MD

## 2021-12-29 NOTE — Significant Event (Addendum)
Attended code blue for the patient. Patient had choked on the food. Received CPR and PCCM Dr Elsworth Soho was at bedside.  Return of spontaneous circulation was obtained.  Patient was then intubated and mechanically ventilated.  Spoke with the patient's sister( contact)and updated her about the clinical condition of the patient and poor prognosis.  She is going to touch base with the patient's daughters as well.  She understands the critical nature of the problem and  wishes to continue with ventilator.  We will change the patient's status to ICU.  We will also consult palliative care for goals of care discussion.

## 2021-12-29 NOTE — Progress Notes (Signed)
Pt was found by nurse blue and not breathing, nurse observed food objecting pt's airway, suction performed and most of the observable food was able to be suctioned out, code blue was initiated, code blue sheet with medication documented, pt intubated and stable at the moment; family made aware.

## 2021-12-29 NOTE — Progress Notes (Signed)
[facility] Progress Note Patient Name: Francisco Robinson DOB: January 23, 1961 MRN: [id number]   Date of Service  12/29/2021  HPI/Events of Note  Bladder scan >600 cc  eICU Interventions  Insert foley     Intervention Category Intermediate Interventions: Oliguria - evaluation and management  Alyra Patty Rodman Pickle 12/29/2021, 10:47 PM
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
## 2021-12-29 NOTE — Progress Notes (Signed)
PROGRESS NOTE    Zakery Normington  ESP:[id number] DOB: 1961/01/11 DOA: 12/23/2021 PCP: Carrolyn Meiers, MD    Brief Narrative:   Joevon Holliman is a 61 y.o. male with past medical history of COPD, type 2 diabetes, systolic congestive heart failure, history of tobacco and alcohol abuse with previous admissions to the hospital presented to the hospital with shortness of breath, increased leg swelling.  He was recently admitted between 5 11-5/ 15 for acute on chronic combined systolic and diastolic heart failure but since discharge he has been having increasing shortness of breath leg swelling and abdominal distention so he decided come to the hospital.  In the ED, patient was noted to be tachypneic and tachycardic.  Labs showed mild leukocytosis in BMP with hyponatremia.  D-dimer was elevated at 5.9.  UA was negative for UTI.  Chest x-ray showed no acute abnormality and improved aeration.  Patient was given breathing treatment and was admitted to hospital for further evaluation and treatment.    Assessment and plan  Principal Problem:   COPD exacerbation ([place]) Active Problems:   Hyponatremia   History of ETOH abuse   Tobacco abuse   Essential hypertension   Leukocytosis   Elevated d-dimer   Fall   Acute exacerbation of chronic obstructive pulmonary disease (COPD) (HCC)   Acute exacerbation of COPD Continue frequent nebulizers, Solu-Medrol, azithromycin.   Continue incentive spirometry and flutter valve.  Continue diuretics.  He has significant cough and wheezing in noisy breathing.  Acute on Chronic combined systolic and diastolic CHF 2D echocardiogram done in January 2023 showed LVEF of 30% severe hypokinesis of the inferior, distal, anterior, apical, distal anterior, septal walls.  Mild LVH.  LV diastolic parameters were indeterminate.  Chest x-ray without any obvious congestion.  BNP elevated> 4,500 (same as 5/15-day of discharge).  Continue intake and output charting, Daily  weights Lasix IV 40 twice daily.  Repeat the 2D echocardiogram from 12/28/2021 showed LV ejection fraction of 20 to 25%.  Patient is at very high risk of recurrent admission to the hospital due to ongoing noncompliance with fluid intake, alcohol ingestion, smoking.  Has had history of recurrent admissions to the hospital.  Continue strict intake and output.  Patient is very noncompliant with medications/diet as he continues to drink alcohol.  Overall prognosis for the patient is poor.  Had good urinary output yesterday at 2200 mL.  Still appears to be decompensated.  Alcohol withdrawal/alcohol abuse History of drinking 12 packs of beer every day.    Continue CIWA protocol thiamine folic acid multivitamin.  Appears calmer.  Chronic hyponatremia possibly secondary to beer potomania/diuretic use Na 128 on presentation.  Latest sodium of 132.  Mild hypokalemia improved with replacement.  Potassium of 4.4 today.  Leukocytosis possibly reactive.  Latest WBC at 22.6.  On steroids.  We will continue to monitor.  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  Recommended annual imaging.  Large right and small left pleural effusion with spiculated opacity in the right apex.   This will need to be followed up as outpatient.  Continue IV diuretics.  On IV Lasix 40 daily.   Mechanical fall Had a fall in the ED as well.  CT head unremarkable.  CT C-spine without any acute findings. Continue fall precautions, PT, OT evaluation pending..   Type 2 diabetes mellitus Hemoglobin A1c as of 9 days ago was 5.4, continue sliding  scale insulin as necessary.     History of CAD Continue aspirin, statin, imdur, Coreg .  No chest pain at this time.   Essential hypertension Continue Coreg blood pressure seems to be stable at this time.  Tobacco abuse Continue nicotine patch.  Deconditioning debility frailty.  Due to  advanced heart failure noncompliance ongoing alcohol abuse.  PT evaluation pending.    DVT prophylaxis: enoxaparin (LOVENOX) injection 40 mg Start: 12/27/21 1000 SCDs Start: 12/27/21 7893   Code Status:     Code Status: Full Code  Disposition: Home likely in 2 to 3 days if clinically improved  Status is: Inpatient  Remains inpatient appropriate because: Decompensated heart failure, COPD exacerbation, deconditioning debility   Family Communication: Communicated with the patient at bedside  Consultants:  None  Procedures:  None  Antimicrobials:  Azithromycin  Anti-infectives (From admission, onward)    Start     Dose/Rate Route Frequency Ordered Stop   12/28/21 1000  azithromycin (ZITHROMAX) tablet 250 mg       See Hyperspace for full Linked Orders Report.   250 mg Oral Daily 12/27/21 0222 01/01/22 0959   12/27/21 1000  azithromycin (ZITHROMAX) tablet 500 mg       See Hyperspace for full Linked Orders Report.   500 mg Oral Daily 12/27/21 0222 12/27/21 0901     Subjective: Today, patient was seen and examined at bedside complains of cough wheezing dyspnea weakness.    Objective: Vitals:   12/29/21 0756 12/29/21 0800 12/29/21 0900 12/29/21 1000  BP:  112/89 111/90 (!) 112/94  Pulse:  83 82 87  Resp:  19 15 (!) 25  Temp:      TempSrc:      SpO2: 98% 98% 93% 98%  Weight:      Height:        Intake/Output Summary (Last 24 hours) at 12/29/2021 1055 Last data filed at 12/29/2021 0700 Gross per 24 hour  Intake 1080 ml  Output 2200 ml  Net -1120 ml   Filed Weights   12/27/21 0023 12/28/21 0500 12/29/21 0323  Weight: 68 kg 64.7 kg 64.4 kg    Physical Examination: Body mass index is 22.24 kg/m.   General:  Average built, not in obvious distress, appears older than stated age, frail and debilitated male on nasal cannula oxygen, chronically ill-appearing HENT:   No scleral pallor or icterus noted. Oral mucosa is moist.  Chest:  Diminished breath sounds  bilaterally.  Wheezes noted with coarse breath sounds with CVS: S1 &S2 heard. No murmur.  Regular rate and rhythm. Abdomen: Soft, nontender, nondistended.  Bowel sounds are heard.   Extremities: No cyanosis, clubbing with bilateral lower extremity edema peripheral pulses are palpable. Psych: Communicative, intermittently somnolent. CNS:  No cranial nerve deficits.  Power equal in all extremities.   Skin: Warm and dry.  No rashes noted.  Data Reviewed:   CBC: Recent Labs  Lab 01/07/2022 2303 12/27/21 0238 12/28/21 0403 12/29/21 0409  WBC 11.3* 9.9 15.0* 22.6*  HGB 12.1* 12.4* 12.6* 11.5*  HCT 34.3* 36.8* 36.1* 34.2*  MCV 95.3 98.1 96.3 98.6  PLT 166 172 211 810    Basic Metabolic Panel: Recent Labs  Lab 01/06/2022 2303 12/27/21 0238 12/28/21 0403 12/29/21 0409  NA 128* 129* 132* 132*  K 3.6 3.3* 4.5 4.4  CL 96* 96* 100 100  CO2 21* '23 25 25  '$ GLUCOSE 93 138* 174* 163*  BUN 18 18 24* 28*  CREATININE 0.59* 0.54* 0.65 0.81  CALCIUM  8.3* 8.3* 8.6* 8.8*  MG  --  1.8 1.9 2.0  PHOS  --  3.6  --   --     Liver Function Tests: Recent Labs  Lab 12/27/21 0238 12/28/21 0403  AST 181* 105*  ALT 96* 71*  ALKPHOS 196* 155*  BILITOT 3.4* 1.9*  PROT 6.5 5.6*  ALBUMIN 3.1* 2.6*     Radiology Studies: CT Angio Chest PE W and/or Wo Contrast  Result Date: 12/28/2021 CLINICAL DATA:  Pulmonary embolism (PE) suspected, positive D-dimer. Dyspnea, productive cough EXAM: CT ANGIOGRAPHY CHEST WITH CONTRAST TECHNIQUE: Multidetector CT imaging of the chest was performed using the standard protocol during bolus administration of intravenous contrast. Multiplanar CT image reconstructions and MIPs were obtained to evaluate the vascular anatomy. RADIATION DOSE REDUCTION: This exam was performed according to the departmental dose-optimization program which includes automated exposure control, adjustment of the mA and/or kV according to patient size and/or use of iterative reconstruction technique.  CONTRAST:  43m OMNIPAQUE IOHEXOL 350 MG/ML SOLN COMPARISON:  10/07/2021 FINDINGS: Cardiovascular: There is adequate opacification of the pulmonary arterial tree. No intraluminal filling defect identified to suggest acute pulmonary embolism. The central pulmonary arteries are enlarged in keeping with changes of pulmonary arterial hypertension. Mild multi-vessel coronary artery calcification. Mild global cardiomegaly. Calcification of the aortic valve leaflets noted. No pericardial effusion. Mild atherosclerotic calcification noted within the thoracic aorta. The ascending aorta is mildly dilated measuring 4.2 cm in greatest dimension. The descending thoracic aorta is of normal caliber. Mediastinum/Nodes: No enlarged mediastinal, hilar, or axillary lymph nodes. Thyroid gland, trachea, and esophagus demonstrate no significant findings. Lungs/Pleura: Motion artifact limits evaluation of the pulmonary parenchyma. Moderate right and small left pleural effusions have developed with associated bibasilar compressive atelectasis. Spiculated opacity has developed within the right apex measuring roughly 12 x 24 mm at axial image # 39/7, possibly infectious or inflammatory in the acute setting. Previously noted ground-glass pulmonary infiltrates within the left upper lobe have resolved. No pneumothorax. No central obstructing lesion. Upper Abdomen: No acute abnormality. Musculoskeletal: No acute bone abnormality. Osseous structures are age-appropriate though evaluation is slightly limited by motion artifact. Review of the MIP images confirms the above findings. IMPRESSION: No pulmonary embolism. Mild coronary artery calcification. Mild global cardiomegaly. Morphologic changes in keeping with pulmonary arterial hypertension. 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 Recommend annual imaging followup by CTA or MRA. This recommendation follows 2010  ACCF/AHA/AATS/ACR/ASA/SCA/SCAI/SIR/STS/SVM Guidelines for the Diagnosis and Management of Patients with Thoracic Aortic Disease. Circulation. 2010; 121:: Q333-L456 Aortic aneurysm NOS (ICD10-I71.9) Large right and small left pleural effusions. Spiculated opacity within the right apex, new since prior examination, possibly infectious or inflammatory in the acute setting. Short-term follow-up evaluation in 3 months, once the patient's acute issues have resolved, would be helpful in documenting resolution. Aortic Atherosclerosis (ICD10-I70.0). Electronically Signed   By: AFidela SalisburyM.D.   On: 12/28/2021 00:43   ECHOCARDIOGRAM LIMITED  Result Date: 12/28/2021    ECHOCARDIOGRAM LIMITED REPORT   Patient Name:   CADIN LAKERDate of Exam: 12/28/2021 Medical Rec #:  [id number]       Height:       67.0 in Accession #:    [id number]      Weight:       142.6 lb Date of Birth:  [birth year]       BSA:          1.752 m Patient Age:  61 years         BP:           106/87 mmHg Patient Gender: M                HR:           96 bpm. Exam Location:  Inpatient Procedure: Limited Echo Indications:    acute systolic and diastolic chf  History:        Patient has prior history of Echocardiogram examinations, most                 recent 08/18/2021. COPD, Signs/Symptoms:Dyspnea; Risk                 Factors:Diabetes, Current Smoker and Alcohol use.  Sonographer:    Johny Chess RDCS Referring Phys: [id number] OLADAPO ADEFESO IMPRESSIONS  1. Left ventricular ejection fraction, by estimation, is 20 to 25%. The left ventricle has severely decreased function. The left ventricle demonstrates global hypokinesis. Left ventricular diastolic parameters are indeterminate.  2. Right ventricular systolic function is mildly reduced. The right ventricular size is mildly enlarged. There is moderately elevated pulmonary artery systolic pressure. The estimated right ventricular systolic pressure is 10.2 mmHg.  3. A small pericardial effusion  is present.  4. The mitral valve is normal in structure. Mild mitral valve regurgitation.  5. Tricuspid valve regurgitation is moderate.  6. The inferior vena cava is dilated in size with <50% respiratory variability, suggesting right atrial pressure of 15 mmHg.  7. The aortic valve is calcified. There is severe calcifcation of the aortic valve. Severe aortic valve stenosis. MG only 5mHg, but markedly decreased SV. AVA 0.5 cm^2, DI 0.22. Consistent with low flow low gradient severe AS FINDINGS  Left Ventricle: Left ventricular ejection fraction, by estimation, is 20 to 25%. The left ventricle has severely decreased function. The left ventricle demonstrates global hypokinesis. The left ventricular internal cavity size was normal in size. There is no left ventricular hypertrophy. Left ventricular diastolic parameters are indeterminate. Right Ventricle: The right ventricular size is mildly enlarged. Right ventricular systolic function is mildly reduced. There is moderately elevated pulmonary artery systolic pressure. The tricuspid regurgitant velocity is 2.92 m/s, and with an assumed right atrial pressure of 15 mmHg, the estimated right ventricular systolic pressure is 472.5mmHg. Pericardium: A small pericardial effusion is present. Mitral Valve: The mitral valve is normal in structure. Mild mitral valve regurgitation. Tricuspid Valve: The tricuspid valve is normal in structure. Tricuspid valve regurgitation is moderate. Aortic Valve: The aortic valve is calcified. There is severe calcifcation of the aortic valve. Severe aortic stenosis is present. Aortic valve mean gradient measures 11.0 mmHg. Aortic valve peak gradient measures 17.3 mmHg. Aortic valve area, by VTI measures 0.47 cm. Pulmonic Valve: The pulmonic valve was not well visualized. Pulmonic valve regurgitation is trivial. Aorta: The aortic root is normal in size and structure. Venous: The inferior vena cava is dilated in size with less than 50%  respiratory variability, suggesting right atrial pressure of 15 mmHg. LEFT VENTRICLE PLAX 2D LVIDd:         5.60 cm   Diastology LVIDs:         4.80 cm   LV e' medial: 3.15 cm/s LV PW:         1.00 cm LV IVS:        1.00 cm LVOT diam:     1.70 cm LV SV:         19 LV SV Index:  11 LVOT Area:     2.27 cm  IVC IVC diam: 2.40 cm LEFT ATRIUM         Index LA diam:    4.00 cm 2.28 cm/m  AORTIC VALVE AV Area (Vmax):    0.46 cm AV Area (Vmean):   0.41 cm AV Area (VTI):     0.47 cm AV Vmax:           208.00 cm/s AV Vmean:          162.000 cm/s AV VTI:            0.414 m AV Peak Grad:      17.3 mmHg AV Mean Grad:      11.0 mmHg LVOT Vmax:         41.80 cm/s LVOT Vmean:        29.200 cm/s LVOT VTI:          0.086 m LVOT/AV VTI ratio: 0.21  AORTA Ao Asc diam: 3.70 cm TRICUSPID VALVE TR Peak grad:   34.1 mmHg TR Vmax:        292.00 cm/s  SHUNTS Systemic VTI:  0.09 m Systemic Diam: 1.70 cm Oswaldo Milian MD Electronically signed by Oswaldo Milian MD Signature Date/Time: 12/28/2021/1:47:40 PM    Final       LOS: 2 days    Flora Lipps, MD [facility] Available via Epic secure chat 7am-7pm After these hours, please refer to coverage provider listed on [email or web address] 12/29/2021, 10:55 AM

## 2021-12-29 NOTE — Progress Notes (Signed)
Pharmacy Antibiotic Note  Francisco Robinson is a 61 y.o. male admitted on 01/03/2022 with aspiration pneumonia.  Pharmacy has been consulted for unasyn dosing.  Plan: Unasyn 3g IV q6h F/U cxs and clinical progress Monitor V/S, labs  Height: '5\' 7"'$  (170.2 cm) Weight: 64.4 kg (141 lb 15.6 oz) IBW/kg (Calculated) : 66.1  Temp (24hrs), Avg:97 F (36.1 C), Min:96.2 F (35.7 C), Max:97.6 F (36.4 C)  Recent Labs  Lab 12/12/2021 2303 12/27/21 0238 12/28/21 0403 12/29/21 0409  WBC 11.3* 9.9 15.0* 22.6*  CREATININE 0.59* 0.54* 0.65 0.81    Estimated Creatinine Clearance: 88.3 mL/min (by C-G formula based on SCr of 0.81 mg/dL).    No Known Allergies  Antimicrobials this admission: unasyn 5/22 >>  zithromax 5/20 >> 5/22  Microbiology results: 5/20 MRSA PCR: negative  Thank you for allowing pharmacy to be a part of this patient's care.  Isac Sarna, BS Pharm D, BCPS Clinical Pharmacist 12/29/2021 3:49 PM

## 2021-12-29 NOTE — Plan of Care (Signed)
  Problem: Acute Rehab PT Goals(only PT should resolve) Goal: Pt Will Go Supine/Side To Sit Outcome: Progressing Flowsheets (Taken 12/29/2021 1219) Pt will go Supine/Side to Sit:  Independently  with modified independence Goal: Patient Will Transfer Sit To/From Stand Outcome: Progressing Flowsheets (Taken 12/29/2021 1219) Patient will transfer sit to/from stand:  Independently  with modified independence Goal: Pt Will Transfer Bed To Chair/Chair To Bed Outcome: Progressing Flowsheets (Taken 12/29/2021 1219) Pt will Transfer Bed to Chair/Chair to Bed:  Independently  with modified independence Goal: Pt Will Perform Standing Balance Or Pre-Gait Outcome: Progressing Flowsheets (Taken 12/29/2021 1219) Pt will perform standing balance or pre-gait:  Independently  with Modified Independent Goal: Pt Will Ambulate Outcome: Progressing Flowsheets (Taken 12/29/2021 1219) Pt will Ambulate:  > 125 feet  with modified independence  with supervision  12:20 PM, 12/29/21 Lestine Box, S/PT

## 2021-12-29 NOTE — Progress Notes (Signed)
Spoke to primary RN and MD Elsworth Soho regarding PICC order and patient's current status. Patient blood pressure too low, with inconsistent readings, not candidate for PICC placement at this time. Dr. Elsworth Soho states he will place CVC.

## 2021-12-30 ENCOUNTER — Inpatient Hospital Stay (HOSPITAL_COMMUNITY): Payer: Medicaid Other

## 2021-12-30 DIAGNOSIS — T17908A Unspecified foreign body in respiratory tract, part unspecified causing other injury, initial encounter: Secondary | ICD-10-CM | POA: Diagnosis not present

## 2021-12-30 DIAGNOSIS — I509 Heart failure, unspecified: Secondary | ICD-10-CM

## 2021-12-30 DIAGNOSIS — Z515 Encounter for palliative care: Secondary | ICD-10-CM

## 2021-12-30 DIAGNOSIS — I469 Cardiac arrest, cause unspecified: Secondary | ICD-10-CM

## 2021-12-30 DIAGNOSIS — Z7189 Other specified counseling: Secondary | ICD-10-CM

## 2021-12-30 DIAGNOSIS — J441 Chronic obstructive pulmonary disease with (acute) exacerbation: Secondary | ICD-10-CM | POA: Diagnosis not present

## 2021-12-30 DIAGNOSIS — J9601 Acute respiratory failure with hypoxia: Secondary | ICD-10-CM | POA: Diagnosis not present

## 2021-12-30 DIAGNOSIS — F10939 Alcohol use, unspecified with withdrawal, unspecified: Secondary | ICD-10-CM

## 2021-12-30 LAB — BASIC METABOLIC PANEL
Anion gap: 6 (ref 5–15)
BUN: 31 mg/dL — ABNORMAL HIGH (ref 6–20)
CO2: 29 mmol/L (ref 22–32)
Calcium: 8.5 mg/dL — ABNORMAL LOW (ref 8.9–10.3)
Chloride: 100 mmol/L (ref 98–111)
Creatinine, Ser: 0.82 mg/dL (ref 0.61–1.24)
GFR, Estimated: >60 mL/min (ref >60)
Glucose, Bld: 154 mg/dL — ABNORMAL HIGH (ref 70–99)
Potassium: 4.2 mmol/L (ref 3.5–5.1)
Sodium: 135 mmol/L (ref 135–145)

## 2021-12-30 LAB — CBC
HCT: 34.8 % — ABNORMAL LOW (ref 39.0–52.0)
Hemoglobin: 11.8 g/dL — ABNORMAL LOW (ref 13.0–17.0)
MCH: 33.4 pg (ref 26.0–34.0)
MCHC: 33.9 g/dL (ref 30.0–36.0)
MCV: 98.6 fL (ref 80.0–100.0)
Platelets: 161 10*3/uL (ref 150–400)
RBC: 3.53 MIL/uL — ABNORMAL LOW (ref 4.22–5.81)
RDW: 17.6 % — ABNORMAL HIGH (ref 11.5–15.5)
WBC: 15 10*3/uL — ABNORMAL HIGH (ref 4.0–10.5)
nRBC: 0 % (ref 0.0–0.2)

## 2021-12-30 LAB — GLUCOSE, CAPILLARY
Glucose-Capillary: 156 mg/dL — ABNORMAL HIGH (ref 70–99)
Glucose-Capillary: 192 mg/dL — ABNORMAL HIGH (ref 70–99)
Glucose-Capillary: 169 mg/dL — ABNORMAL HIGH (ref 70–99)

## 2021-12-30 LAB — MAGNESIUM: Magnesium: 2.1 mg/dL (ref 1.7–2.4)

## 2021-12-30 MED ORDER — ACETAMINOPHEN 325 MG PO TABS: 650.0000 mg | ORAL_TABLET | Freq: Four times a day (QID) | ORAL | Status: DC | PRN

## 2021-12-30 MED ORDER — DIPHENHYDRAMINE HCL 50 MG/ML IJ SOLN: 25.0000 mg | INTRAMUSCULAR | Status: DC | PRN

## 2021-12-30 MED ORDER — ACETAMINOPHEN 650 MG RE SUPP: 650.0000 mg | Freq: Four times a day (QID) | RECTAL | Status: DC | PRN

## 2021-12-30 MED ORDER — GLYCOPYRROLATE 0.2 MG/ML IJ SOLN
0.2000 mg | INTRAMUSCULAR | Status: DC | PRN
Start: 2021-12-30 — End: 2022-01-01
  Filled 2021-12-30: qty 1

## 2021-12-30 MED ORDER — GLYCOPYRROLATE 0.2 MG/ML IJ SOLN: 0.2000 mg | INTRAMUSCULAR | Status: DC | PRN

## 2021-12-30 MED ORDER — GLYCOPYRROLATE 1 MG PO TABS
1.0000 mg | ORAL_TABLET | ORAL | Status: DC | PRN
  Filled 2021-12-30: qty 1

## 2021-12-30 MED ORDER — GLYCOPYRROLATE 1 MG PO TABS: 1.0000 mg | ORAL_TABLET | ORAL | Status: DC | PRN

## 2021-12-30 MED ORDER — POLYVINYL ALCOHOL 1.4 % OP SOLN: 1.0000 [drp] | Freq: Four times a day (QID) | OPHTHALMIC | Status: DC | PRN

## 2021-12-30 MED ORDER — LORAZEPAM 2 MG/ML IJ SOLN
2.0000 mg | INTRAMUSCULAR | Status: DC
  Administered 2021-12-30 – 2022-01-02 (×19): 2 mg via INTRAVENOUS
  Filled 2021-12-30 (×19): qty 1

## 2021-12-30 MED ORDER — GLYCOPYRROLATE 0.2 MG/ML IJ SOLN
0.2000 mg | INTRAMUSCULAR | Status: DC | PRN
  Administered 2021-12-31 – 2022-01-01 (×6): 0.2 mg via INTRAVENOUS
  Filled 2021-12-30 (×5): qty 1

## 2021-12-30 NOTE — Consult Note (Signed)
Consultation Note Date: 12/30/2021   Patient Name: Francisco Robinson  DOB: 1961-01-10  MRN: [id number]  Age / Sex: 61 y.o., male  PCP: Carrolyn Meiers, MD Referring Physician: Rodena Goldmann, DO  Reason for Consultation: Goals of care discussion, significantly low EF, recurrent admissions to the hospital, noncompliance, continued polysubstance abuse, status choking and cardiorespiratory respiratory arrest  HPI/Patient Profile: 61 y.o. male  with past medical history of anxiety, arthritis, seizures from alcohol withdrawal, remote stroke with memory deficits, COPD, DM2, CHF-echo in January 2023 shows ejection fraction of 30%, ETOH dependence. He was recently hospitalized 5/11-5/15 for acute on chronic CHF.  For that he was hospitalized on 5/6-12/15/2021 and he left AGAINST MEDICAL ADVICE.  On chart review he has had an hospitalist visit for admission every month since November 2022.  He is now admitted on 01/03/2022 with COPD/CHF exacerbation with shortness of breath, increased leg swelling, abdominal distention, productive cough, and hyponatremia.  He developed alcohol withdrawal with agitation, on CIWA.  He was found to be frail and severely deconditioned.  On 5/22 he was found unresponsive, pulseless and not breathing and appeared to had aspirated on his food tray.  [facility] was called, received CPR with ROSC within 4 minutes and required intubation he underwent bronc for removal of debris.  Palliative medicine consulted for above.  Primary Decision Maker NEXT OF KIN- listed as sister Nicholes Mango  Discussion: Chart has been reviewed including notes from this and previous admission, care everywhere, labs, and imaging.  Report received from Dr. Manuella Ghazi and patient's nurse.  Patient was initially seen by palliative in January 2023 by Quinn Axe, NP.  At that time patient refused to participate in palliative consult.   Family was unavailable.  He left AMA.  I met at the bedside with patient's sister- Hilda Blades. She had just met with Dr. Manuella Ghazi and received medical update. She shares that she lost a daughter a year ago in a similar way and this event is bringing up previous grief.    Patient has 2 daughters- they are estranged, Hilda Blades contacted them and they did not wish to be involved in decision making. Although they did share their opinion that patient should not be sustained on life support.   We reviewed possible trajectories and paths of care including extubation without reintubation, extubation to comfort measures only and continued aggressive care.   Given patient's poor state of health prior to this admission, his frail state and the likelihood of ongoing debility even with continued aggressive medical care- and knowing that patient would not wish to live in disabled state, patient's sister made compassionate decision to transition to full comfort measures only- understanding this means extubation and stopping interventions that are life prolonging, but providing interventions and medications for symptom management as patient proceeds through end of life.   There is one more family member coming, then they wish to proceed with extubation to comfort. They are open to Chaplain support.    SUMMARY OF RECOMMENDATIONS -DNR -Extubation to comfort- extubation and  comfort orders entered    Code Status/Advance Care Planning: DNR   Prognosis:   Hours - Days  Discharge Planning: Anticipated Hospital Death  Primary Diagnoses: Present on Admission:  COPD exacerbation ([place])  Tobacco abuse  Hyponatremia  History of ETOH abuse  Essential hypertension  Acute exacerbation of chronic obstructive pulmonary disease (COPD) ([place])   Review of Systems  Unable to perform ROS: Intubated   Physical Exam  Vital Signs: BP 99/80   Pulse 84   Temp 97.7 F (36.5 C) (Oral)   Resp 18   Ht 5' 7"  (1.702 m)   Wt 64.5 kg    SpO2 97%   BMI 22.27 kg/m  Pain Scale: CPOT   Pain Score: 0-No pain   SpO2: SpO2: 97 % O2 Device:SpO2: 97 % O2 Flow Rate: .O2 Flow Rate (L/min): 2 L/min  IO: Intake/output summary:  Intake/Output Summary (Last 24 hours) at 12/30/2021 1135 Last data filed at 12/30/2021 0800 Gross per 24 hour  Intake 1238.75 ml  Output 800 ml  Net 438.75 ml    LBM: Last BM Date : 12/15/2021 Baseline Weight: Weight: 61.1 kg Most recent weight: Weight: 64.5 kg       Thank you for this consult. Palliative medicine will continue to follow and assist as needed.   Greater than 50%  of this time was spent counseling and coordinating care related to the above assessment and plan.  Signed by: Mariana Kaufman, AGNP-C Palliative Medicine    Please contact Palliative Medicine Team phone at [phone] for questions and concerns.  For individual provider: See Shea Evans

## 2021-12-30 NOTE — Procedures (Signed)
Extubation Procedure Note  Patient Details:   Name: [name] DOB: November 16, 1960 MRN: [id number]   Airway Documentation:  Airway 24 mm (Active)  Secured at (cm) 23 cm 12/30/21 1200  Measured From Lips 12/30/21 [place] 12/30/21 1200  Secured By [employer] 12/30/21 1200  Tube Holder Repositioned Yes 12/30/21 1200  Prone position No 12/30/21 1200  Head position Right 12/30/21 1200  Cuff Pressure (cm H2O) Clear OR 27-39 CmH2O 12/30/21 1200  Site Condition Dry 12/30/21 1200   Vent end date: (not recorded) Vent end time: (not recorded)   Evaluation  O2 sats: stable throughout Complications: No apparent complications Patient did tolerate procedure well. Bilateral Breath Sounds: Expiratory wheezes   No This was a terminal extubation to RA.  Lucianne Muss 12/30/2021, 1:45 PM

## 2021-12-30 NOTE — Progress Notes (Addendum)
PROGRESS NOTE    Francisco Robinson  VZS:[id number] DOB: 1960/11/17 DOA: 12/23/2021 PCP: Carrolyn Meiers, MD   Brief Narrative:     Francisco Robinson is a 61 y.o. male with past medical history of COPD, type 2 diabetes, systolic congestive heart failure, history of tobacco and alcohol abuse with previous admissions to the hospital presented to the hospital with shortness of breath, increased leg swelling.  He was recently admitted between 5/11-5/15 for acute on chronic combined systolic and diastolic heart failure but since discharge he has been having increasing shortness of breath leg swelling and abdominal distention so he decided come to the hospital.  In the ED, patient was noted to be tachypneic and tachycardic.  Labs showed mild leukocytosis in BMP with hyponatremia.  D-dimer was elevated at 5.9.  UA was negative for UTI.  Chest x-ray showed no acute abnormality and improved aeration.  He was admitted with concerns for acute COPD exacerbation as well as acute on chronic combined systolic and diastolic CHF exacerbation in the setting of alcohol abuse.  On 5/22 he had an aspiration event that led to acute hypoxemic respiratory failure and subsequent PEA arrest.  He is currently intubated and PCCM is following.  Assessment & Plan:   Principal Problem:   COPD exacerbation ([place]) Active Problems:   Hyponatremia   History of ETOH abuse   Tobacco abuse   Essential hypertension   Leukocytosis   Elevated d-dimer   Fall   Acute exacerbation of chronic obstructive pulmonary disease (COPD) (HCC)   Aspiration into airway   Cardiac arrest, cause unspecified ([place])  Assessment and Plan:   Status post PEA arrest with acute hypoxemic respiratory failure and aspiration Patient is status post intubation 5/22 as well as bronchoscopy to remove contents in the airway per PCCM Unasyn added for aspiration coverage Palliative consultation pending for further goals of care discussion  Acute  exacerbation of COPD Continue Solu-Medrol and nebulizers while on ventilator  Acute on Chronic combined systolic and diastolic CHF 2D echocardiogram done in January 2023 showed LVEF of 30% severe hypokinesis of the inferior, distal, anterior, apical, distal anterior, septal walls.  Mild LVH.  LV diastolic parameters were indeterminate.  Chest x-ray without any obvious congestion.  BNP elevated> 4,500 (same as 5/15-day of discharge).  Continue intake and output charting, Daily weights Lasix IV 40 twice daily.  Repeat the 2D echocardiogram from 12/28/2021 showed LV ejection fraction of 20 to 25%.  Patient is at very high risk of recurrent admission to the hospital due to ongoing noncompliance with fluid intake, alcohol ingestion, smoking.  Has had history of recurrent admissions to the hospital.  Continue strict intake and output.  Patient is very noncompliant with medications/diet as he continues to drink alcohol.  Overall prognosis for the patient is poor.  Had good urinary output yesterday at 2200 mL.  Still appears to be decompensated. -Coreg and Imdur will be held   Alcohol withdrawal/alcohol abuse History of drinking 12 packs of beer every day.    Continue CIWA protocol thiamine folic acid multivitamin.   Chronic hyponatremia possibly secondary to beer potomania/diuretic use Na 128 on presentation.  Latest sodium of 135.  Leukocytosis possibly reactive.  Now with concerns for aspiration on Unasyn.  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  Recommended annual imaging.  Large right and small left pleural effusion with spiculated  opacity in the right apex.   This will need to be followed up as outpatient.    Mechanical fall Had a fall in the ED as well.  CT head unremarkable.  CT C-spine without any acute findings. Continue fall precautions, PT,  OT evaluation when able.   Type 2 diabetes mellitus Hemoglobin A1c as of 9 days ago was 5.4, continue sliding scale insulin as necessary.     History of CAD Continue aspirin and statin.  Hold Imdur and Coreg   Essential hypertension-currently with hypotension Holding Coreg for now   Tobacco abuse Continue nicotine patch.   Deconditioning debility frailty.  Due to advanced heart failure noncompliance ongoing alcohol abuse.  PT evaluation pending when able to come off the ventilator.    DVT prophylaxis: Lovenox Code Status: DNR Family Communication: Discussed with sister at bedside 5/23 Disposition Plan:  Status is: Inpatient Remains inpatient appropriate because: Need for mechanical ventilation/intubated with IV medications.   Skin Assessment:  I have examined the patient's skin and I agree with the wound assessment as performed by the wound care RN as outlined below:  Pressure Injury 12/27/21 Anterior;Right unstagable. Black necrtoic area (Active)  12/27/21 1407  Location:   Location Orientation: Anterior;Right  Staging:   Wound Description (Comments): unstagable. Black necrtoic area  Present on Admission: Yes  Dressing Type None 12/30/21 0800     Pressure Injury 12/27/21 Anterior;Left black necrotic area on left great toe (Active)  12/27/21 1411  Location:   Location Orientation: Anterior;Left  Staging:   Wound Description (Comments): black necrotic area on left great toe  Present on Admission: Yes  Dressing Type None 12/30/21 0800    Consultants:  PCCM Palliative care  Procedures:  Intubation 5/22 with bronchoscopy CVL 5/22  Antimicrobials:  Anti-infectives (From admission, onward)    Start     Dose/Rate Route Frequency Ordered Stop   12/29/21 1700  Ampicillin-Sulbactam (UNASYN) 3 g in sodium chloride 0.9 % 100 mL IVPB        3 g 200 mL/hr over 30 Minutes Intravenous Every 6 hours 12/29/21 1547     12/28/21 1000  azithromycin (ZITHROMAX) tablet 250 mg   Status:  Discontinued       See Hyperspace for full Linked Orders Report.   250 mg Oral Daily 12/27/21 0222 12/29/21 1547   12/27/21 1000  azithromycin (ZITHROMAX) tablet 500 mg       See Hyperspace for full Linked Orders Report.   500 mg Oral Daily 12/27/21 0222 12/27/21 0901       Subjective: Patient seen and evaluated today on mechanical ventilation with no acute overnight events noted.  Objective: Vitals:   12/30/21 0900 12/30/21 1000 12/30/21 1100 12/30/21 1126  BP: 98/74 91/72 99/80    Pulse: 83 85 84   Resp: 18 18 18    Temp:    97.7 F (36.5 C)  TempSrc:    Oral  SpO2: 98% 98% 97%   Weight:      Height:        Intake/Output Summary (Last 24 hours) at 12/30/2021 1137 Last data filed at 12/30/2021 0800 Gross per 24 hour  Intake 1238.75 ml  Output 800 ml  Net 438.75 ml   Filed Weights   12/28/21 0500 12/29/21 0323 12/30/21 0400  Weight: 64.7 kg 64.4 kg 64.5 kg    Examination:  General exam: Appears sedated on ventilator Respiratory system: Breath sounds bilaterally.  Intubated on FiO2 35%. Cardiovascular system: S1 & S2 heard, RRR.  Gastrointestinal system:  Abdomen is soft Central nervous system: Sedated Extremities: No edema Skin: No significant lesions noted Psychiatry: Flat affect.    Data Reviewed: I have personally reviewed following labs and imaging studies  CBC: Recent Labs  Lab 12/19/2021 2303 12/27/21 0238 12/28/21 0403 12/29/21 0409 12/30/21 0430  WBC 11.3* 9.9 15.0* 22.6* 15.0*  HGB 12.1* 12.4* 12.6* 11.5* 11.8*  HCT 34.3* 36.8* 36.1* 34.2* 34.8*  MCV 95.3 98.1 96.3 98.6 98.6  PLT 166 172 211 189 557   Basic Metabolic Panel: Recent Labs  Lab 12/11/2021 2303 12/27/21 0238 12/28/21 0403 12/29/21 0409 12/30/21 0430  NA 128* 129* 132* 132* 135  K 3.6 3.3* 4.5 4.4 4.2  CL 96* 96* 100 100 100  CO2 21* 23 25 25 29   GLUCOSE 93 138* 174* 163* 154*  BUN 18 18 24* 28* 31*  CREATININE 0.59* 0.54* 0.65 0.81 0.82  CALCIUM 8.3* 8.3* 8.6*  8.8* 8.5*  MG  --  1.8 1.9 2.0 2.1  PHOS  --  3.6  --   --   --    GFR: Estimated Creatinine Clearance: 87.4 mL/min (by C-G formula based on SCr of 0.82 mg/dL). Liver Function Tests: Recent Labs  Lab 12/27/21 0238 12/28/21 0403  AST 181* 105*  ALT 96* 71*  ALKPHOS 196* 155*  BILITOT 3.4* 1.9*  PROT 6.5 5.6*  ALBUMIN 3.1* 2.6*   No results for input(s): LIPASE, AMYLASE in the last 168 hours. No results for input(s): AMMONIA in the last 168 hours. Coagulation Profile: No results for input(s): INR, PROTIME in the last 168 hours. Cardiac Enzymes: No results for input(s): CKTOTAL, CKMB, CKMBINDEX, TROPONINI in the last 168 hours. BNP (last 3 results) No results for input(s): PROBNP in the last 8760 hours. HbA1C: No results for input(s): HGBA1C in the last 72 hours. CBG: Recent Labs  Lab 12/29/21 1924 12/29/21 2323 12/30/21 0427 12/30/21 0801 12/30/21 1116  GLUCAP 209* 168* 156* 192* 169*   Lipid Profile: No results for input(s): CHOL, HDL, LDLCALC, TRIG, CHOLHDL, LDLDIRECT in the last 72 hours. Thyroid Function Tests: No results for input(s): TSH, T4TOTAL, FREET4, T3FREE, THYROIDAB in the last 72 hours. Anemia Panel: No results for input(s): VITAMINB12, FOLATE, FERRITIN, TIBC, IRON, RETICCTPCT in the last 72 hours. Sepsis Labs: No results for input(s): PROCALCITON, LATICACIDVEN in the last 168 hours.  Recent Results (from the past 240 hour(s))  MRSA Next Gen by PCR, Nasal     Status: None   Collection Time: 12/27/21  1:13 PM   Specimen: Nasal Mucosa; Nasal Swab  Result Value Ref Range Status   MRSA by PCR Next Gen NOT DETECTED NOT DETECTED Final    Comment: (NOTE) The GeneXpert MRSA Assay (FDA approved for NASAL specimens only), is one component of a comprehensive MRSA colonization surveillance program. It is not intended to diagnose MRSA infection nor to guide or monitor treatment for MRSA infections. Test performance is not FDA approved in patients less than 3  years old. Performed at [facility], [address], [place], [place] [address]          Radiology Studies: Portable Chest xray  Result Date: 12/30/2021 CLINICAL DATA:  Aspiration. EXAM: PORTABLE CHEST 1 VIEW COMPARISON:  Dec 29, 2021. FINDINGS: Stable cardiomediastinal silhouette. Endotracheal and nasogastric tubes are unchanged in position. Left internal jugular catheter is unchanged. Stable right perihilar opacity is noted concerning for edema or possibly pneumonia. Left lung is unremarkable. Bony thorax is unremarkable. IMPRESSION: Stable support apparatus. Stable right perihilar opacity as described  above. Electronically Signed   By: Marijo Conception M.D.   On: 12/30/2021 08:15   Portable Chest x-ray  Result Date: 12/29/2021 CLINICAL DATA:  Shortness of breath EXAM: PORTABLE CHEST 1 VIEW COMPARISON:  Previous studies including the examination of 12/09/2021 FINDINGS: Transverse diameter of heart is increased. Central pulmonary vessels are more prominent, more so on the right side. This finding may be partly due to supine position. No new focal pulmonary infiltrates are seen. Lateral CP angles are clear. There is no pneumothorax. Tip endotracheal tube is 4 cm above the carina. IMPRESSION: Cardiomegaly. Central pulmonary vessels are prominent which may suggest mild CHF. Part of this finding may be due to supine position. There is no focal pulmonary consolidation. Electronically Signed   By: Elmer Picker M.D.   On: 12/29/2021 15:26   DG Chest Port 1V same Day  Result Date: 12/29/2021 CLINICAL DATA:  Central line and OG tube placement EXAM: PORTABLE CHEST 1 VIEW COMPARISON:  Earlier same day FINDINGS: Endotracheal tube is 4.5 cm above the carina. Enteric tube passes below the diaphragm with tip within the body of the stomach. Left IJ central line tip overlies the confluence of brachiocephalic veins. No pneumothorax. Similar interstitial prominence with patchy right perihilar density.  Stable cardiomediastinal contours. Small right pleural effusion. IMPRESSION: Lines and tubes as above. No pneumothorax. Similar lung aeration. Small right pleural effusion. Electronically Signed   By: Macy Mis M.D.   On: 12/29/2021 17:30   ECHOCARDIOGRAM LIMITED  Result Date: 12/28/2021    ECHOCARDIOGRAM LIMITED REPORT   Patient Name:   Francisco Robinson Date of Exam: 12/28/2021 Medical Rec #:  [id number]        Height:       67.0 in Accession #:    [id number]       Weight:       142.6 lb Date of Birth:  Jul 16, 1961        BSA:          1.752 m Patient Age:    85 years         BP:           106/87 mmHg Patient Gender: M                HR:           96 bpm. Exam Location:  Inpatient Procedure: Limited Echo Indications:    acute systolic and diastolic chf  History:        Patient has prior history of Echocardiogram examinations, most                 recent 08/18/2021. COPD, Signs/Symptoms:Dyspnea; Risk                 Factors:Diabetes, Current Smoker and Alcohol use.  Sonographer:    Johny Chess RDCS Referring Phys: [id number] OLADAPO ADEFESO IMPRESSIONS  1. Left ventricular ejection fraction, by estimation, is 20 to 25%. The left ventricle has severely decreased function. The left ventricle demonstrates global hypokinesis. Left ventricular diastolic parameters are indeterminate.  2. Right ventricular systolic function is mildly reduced. The right ventricular size is mildly enlarged. There is moderately elevated pulmonary artery systolic pressure. The estimated right ventricular systolic pressure is 50.2 mmHg.  3. A small pericardial effusion is present.  4. The mitral valve is normal in structure. Mild mitral valve regurgitation.  5. Tricuspid valve regurgitation is moderate.  6. The inferior vena cava is dilated in size with <50% respiratory variability, suggesting  right atrial pressure of 15 mmHg.  7. The aortic valve is calcified. There is severe calcifcation of the aortic valve. Severe aortic valve  stenosis. MG only 76mHg, but markedly decreased SV. AVA 0.5 cm^2, DI 0.22. Consistent with low flow low gradient severe AS FINDINGS  Left Ventricle: Left ventricular ejection fraction, by estimation, is 20 to 25%. The left ventricle has severely decreased function. The left ventricle demonstrates global hypokinesis. The left ventricular internal cavity size was normal in size. There is no left ventricular hypertrophy. Left ventricular diastolic parameters are indeterminate. Right Ventricle: The right ventricular size is mildly enlarged. Right ventricular systolic function is mildly reduced. There is moderately elevated pulmonary artery systolic pressure. The tricuspid regurgitant velocity is 2.92 m/s, and with an assumed right atrial pressure of 15 mmHg, the estimated right ventricular systolic pressure is 494.8mmHg. Pericardium: A small pericardial effusion is present. Mitral Valve: The mitral valve is normal in structure. Mild mitral valve regurgitation. Tricuspid Valve: The tricuspid valve is normal in structure. Tricuspid valve regurgitation is moderate. Aortic Valve: The aortic valve is calcified. There is severe calcifcation of the aortic valve. Severe aortic stenosis is present. Aortic valve mean gradient measures 11.0 mmHg. Aortic valve peak gradient measures 17.3 mmHg. Aortic valve area, by VTI measures 0.47 cm. Pulmonic Valve: The pulmonic valve was not well visualized. Pulmonic valve regurgitation is trivial. Aorta: The aortic root is normal in size and structure. Venous: The inferior vena cava is dilated in size with less than 50% respiratory variability, suggesting right atrial pressure of 15 mmHg. LEFT VENTRICLE PLAX 2D LVIDd:         5.60 cm   Diastology LVIDs:         4.80 cm   LV e' medial: 3.15 cm/s LV PW:         1.00 cm LV IVS:        1.00 cm LVOT diam:     1.70 cm LV SV:         19 LV SV Index:   11 LVOT Area:     2.27 cm  IVC IVC diam: 2.40 cm LEFT ATRIUM         Index LA diam:    4.00 cm  2.28 cm/m  AORTIC VALVE AV Area (Vmax):    0.46 cm AV Area (Vmean):   0.41 cm AV Area (VTI):     0.47 cm AV Vmax:           208.00 cm/s AV Vmean:          162.000 cm/s AV VTI:            0.414 m AV Peak Grad:      17.3 mmHg AV Mean Grad:      11.0 mmHg LVOT Vmax:         41.80 cm/s LVOT Vmean:        29.200 cm/s LVOT VTI:          0.086 m LVOT/AV VTI ratio: 0.21  AORTA Ao Asc diam: 3.70 cm TRICUSPID VALVE TR Peak grad:   34.1 mmHg TR Vmax:        292.00 cm/s  SHUNTS Systemic VTI:  0.09 m Systemic Diam: 1.70 cm COswaldo MilianMD Electronically signed by COswaldo MilianMD Signature Date/Time: 12/28/2021/1:47:40 PM    Final    [place] SITE RITE  Result Date: 12/29/2021 If Site Rite image not attached, placement could not be confirmed due to current cardiac rhythm.  Scheduled Meds:  aspirin  81 mg Per Tube Daily   atorvastatin  40 mg Per Tube Daily   chlorhexidine gluconate (MEDLINE KIT)  15 mL Mouth Rinse BID   Chlorhexidine Gluconate Cloth  6 each Topical Daily   docusate  100 mg Per Tube BID   enoxaparin (LOVENOX) injection  40 mg Subcutaneous Q24H   fentaNYL (SUBLIMAZE) injection  50 mcg Intravenous Once   folic acid  1 mg Per Tube Daily   furosemide  40 mg Intravenous Daily   guaiFENesin-dextromethorphan  10 mL Per Tube Q8H   ipratropium-albuterol  3 mL Nebulization TID   magnesium oxide  400 mg Per Tube BID   mouth rinse  15 mL Mouth Rinse 10 times per day   methylPREDNISolone (SOLU-MEDROL) injection  40 mg Intravenous Q12H   multivitamin with minerals  1 tablet Per Tube Daily   nicotine  21 mg Transdermal Daily   pantoprazole sodium  40 mg Per Tube QHS   polyethylene glycol  17 g Per Tube Daily   thiamine  100 mg Per Tube Daily   Or   thiamine  100 mg Intravenous Daily   Continuous Infusions:  sodium chloride     ampicillin-sulbactam (UNASYN) IV 3 g (12/30/21 1109)   fentaNYL infusion INTRAVENOUS Stopped (12/30/21 0700)   norepinephrine (LEVOPHED) Adult  infusion Stopped (12/30/21 0025)     LOS: 3 days    Critical care time: 40 minutes.    Jonathan Corpus Darleen Crocker, DO [facility]  If 7PM-7AM, please contact night-coverage [email or web address] 12/30/2021, 11:37 AM

## 2021-12-30 NOTE — Progress Notes (Signed)
NAME:  Francisco Robinson, MRN:  [id number], DOB:  29-Sep-1960, LOS: 3 ADMISSION DATE:  12/25/2021, CONSULTATION DATE:  12/30/2021  REFERRING MD:  Louanne Belton, TRH, CHIEF COMPLAINT: Respiratory distress/PEA arrest  History of Present Illness:  Emergently called into the room for this 61 year old man with COPD, chronic systolic heart failure with recurrent admissions for CHF exacerbation.  Recent admission 5/11 to 5/15 for acute congestive heart failure and this admission 5/19 for similar symptoms of shortness of breath leg swelling and abdominal distention , course complicated by EtOH withdrawal. He was noted to be cyanotic with obviously choking on food , bradycardic.  He was emergently bagged lost pulse, CPR started, ROSC achieved within 4 minutes, epi x1.  Intubated and food contents suctioned out from upper and lower airway  Pertinent  Medical History  COPD Chronic systolic heart failure -EF 20 % on echo 12/2021, multiple WMA EtOH abuse -drinks 12 pack beer daily, multiple episodes of EtOH withdrawal Chronic hyponatremia Type 2 diabetes CAD Hypertension Tobacco abuse  Significant Hospital Events: Including procedures, antibiotic start and stop dates in addition to other pertinent events   5/19 CT angiogram chest no pulmonary embolism, spiculated opacity right apex 5/22 witnessed aspiration with brief PEA arrest, intubated, Levophed drip  Interim History / Subjective:  Off pressors Afebrile Good urine output  Objective   Blood pressure 102/85, pulse 88, temperature 97.7 F (36.5 C), temperature source Oral, resp. rate 18, height '5\' 7"'$  (1.702 m), weight 64.5 kg, SpO2 96 %.    Vent Mode: PRVC FiO2 (%):  [35 %-100 %] 35 % Set Rate:  [18 bmp] 18 bmp Vt Set:  [530 mL] 530 mL PEEP:  [5 cmH20] 5 cmH20 Plateau Pressure:  [18 cmH20-23 cmH20] 18 cmH20   Intake/Output Summary (Last 24 hours) at 12/30/2021 1345 Last data filed at 12/30/2021 0800 Gross per 24 hour  Intake 1238.75 ml  Output  --  Net 1238.75 ml    Filed Weights   12/28/21 0500 12/29/21 0323 12/30/21 0400  Weight: 64.7 kg 64.4 kg 64.5 kg    Examination: General: Chronically ill-appearing man, intubated HENT: Mild pallor, no icterus, JVD present , oral ETT Lungs: No accessory muscle use, decreased breath sounds bilateral Cardiovascular: S1-S2 tacky, regular Abdomen: Soft, nontender, no guarding Extremities: 1+ edema, no deformity Neuro: Unresponsive, does not follow commands pupils 3 mm bilaterally equal reactive to light   Chest x-ray independently reviewed shows ET tube in position, no new infiltrates Labs show normal electrolytes, mild leukocytosis and thrombocytopenia  Resolved Hospital Problem list   Hypotension post medications given for sedation -Doubt sepsis  Assessment & Plan:  PEA arrest Aspiration into airway Acute hypoxic respiratory failure  - Bronchoscopy performed with removal of contents from lower airway.   -Add empiric Unasyn for aspiration, obtain respiratory culture -Being treated for COPD exacerbation already with Solu-Medrol, and bronchodilators   Acute on chronic systolic heart failure -hold meds and diuresis for now -Coreg and Imdur will be held   Acute encephalopathy-postarrest EtOH withdrawal  -Use fentanyl prn & gtt  Palliative care discussion reviewed.  Plan is to extubate to comfort Use fentanyl drip.  Sister updated at bedside PCCM will be available as needed   Best Practice (right click and "Reselect all SmartList Selections" daily)   Diet/type: NPO w/ meds via tube DVT prophylaxis: LMWH GI prophylaxis: PPI Lines: N/A Foley:  N/A Code Status:  full code Last date of multidisciplinary goals of care discussion [5/23]  Labs   CBC: Recent Labs  Lab 01/03/2022 2303 12/27/21 0238 12/28/21 0403 12/29/21 0409 12/30/21 0430  WBC 11.3* 9.9 15.0* 22.6* 15.0*  HGB 12.1* 12.4* 12.6* 11.5* 11.8*  HCT 34.3* 36.8* 36.1* 34.2* 34.8*  MCV 95.3 98.1 96.3 98.6  98.6  PLT 166 172 211 189 161     Basic Metabolic Panel: Recent Labs  Lab 12/25/2021 2303 12/27/21 0238 12/28/21 0403 12/29/21 0409 12/30/21 0430  NA 128* 129* 132* 132* 135  K 3.6 3.3* 4.5 4.4 4.2  CL 96* 96* 100 100 100  CO2 21* '23 25 25 29  '$ GLUCOSE 93 138* 174* 163* 154*  BUN 18 18 24* 28* 31*  CREATININE 0.59* 0.54* 0.65 0.81 0.82  CALCIUM 8.3* 8.3* 8.6* 8.8* 8.5*  MG  --  1.8 1.9 2.0 2.1  PHOS  --  3.6  --   --   --     GFR: Estimated Creatinine Clearance: 87.4 mL/min (by C-G formula based on SCr of 0.82 mg/dL). Recent Labs  Lab 12/27/21 0238 12/28/21 0403 12/29/21 0409 12/30/21 0430  WBC 9.9 15.0* 22.6* 15.0*     Liver Function Tests: Recent Labs  Lab 12/27/21 0238 12/28/21 0403  AST 181* 105*  ALT 96* 71*  ALKPHOS 196* 155*  BILITOT 3.4* 1.9*  PROT 6.5 5.6*  ALBUMIN 3.1* 2.6*    No results for input(s): LIPASE, AMYLASE in the last 168 hours. No results for input(s): AMMONIA in the last 168 hours.  ABG    Component Value Date/Time   PHART 7.38 12/29/2021 1609   PCO2ART 39 12/29/2021 1609   PO2ART 288 (H) 12/29/2021 1609   HCO3 23.1 12/29/2021 1609   ACIDBASEDEF 1.8 12/29/2021 1609   O2SAT 99 12/29/2021 1609      Coagulation Profile: No results for input(s): INR, PROTIME in the last 168 hours.  Cardiac Enzymes: No results for input(s): CKTOTAL, CKMB, CKMBINDEX, TROPONINI in the last 168 hours.  HbA1C: Hgb A1c MFr Bld  Date/Time Value Ref Range Status  12/18/2021 07:08 PM 5.4 4.8 - 5.6 % Final    Comment:    (NOTE) Pre diabetes:          5.7%-6.4%  Diabetes:              >6.4%  Glycemic control for   <7.0% adults with diabetes   12/13/2021 08:24 AM 5.2 4.8 - 5.6 % Final    Comment:    REPEATED TO VERIFY (NOTE) Pre diabetes:          5.7%-6.4%  Diabetes:              >6.4%  Glycemic control for   <7.0% adults with diabetes     CBG: Recent Labs  Lab 12/29/21 1924 12/29/21 2323 12/30/21 0427 12/30/21 0801  12/30/21 1116  GLUCAP 209* 168* 156* 192* 169*       Critical care time: [place] [place] [facility] Pager : [phone]  If no response to pager , please call [phone] until 7 pm After 7:00 pm call Elink  [phone]   12/30/2021

## 2021-12-31 DIAGNOSIS — T17908A Unspecified foreign body in respiratory tract, part unspecified causing other injury, initial encounter: Secondary | ICD-10-CM | POA: Diagnosis not present

## 2021-12-31 DIAGNOSIS — J441 Chronic obstructive pulmonary disease with (acute) exacerbation: Secondary | ICD-10-CM | POA: Diagnosis not present

## 2021-12-31 DIAGNOSIS — I469 Cardiac arrest, cause unspecified: Secondary | ICD-10-CM | POA: Diagnosis not present

## 2021-12-31 DIAGNOSIS — Z515 Encounter for palliative care: Secondary | ICD-10-CM | POA: Diagnosis not present

## 2021-12-31 DIAGNOSIS — F10939 Alcohol use, unspecified with withdrawal, unspecified: Secondary | ICD-10-CM | POA: Diagnosis not present

## 2021-12-31 MED ORDER — FENTANYL BOLUS VIA INFUSION
50.0000 ug | INTRAVENOUS | Status: DC | PRN
  Administered 2022-01-02 (×3): 100 ug via INTRAVENOUS

## 2021-12-31 NOTE — Progress Notes (Signed)
Progress Note   Patient: Francisco Robinson JYN:[id number] DOB: 11-Mar-1961 DOA: 12/30/2021     4 DOS: the patient was seen and examined on 12/31/2021   Brief hospital course:  Francisco Robinson is a 61 y.o. male with past medical history of COPD, type 2 diabetes, systolic congestive heart failure, history of tobacco and alcohol abuse with previous admissions to the hospital presented to the hospital with shortness of breath, increased leg swelling.  He was recently admitted between 5 11-5/ 15 for acute on chronic combined systolic and diastolic heart failure but since discharge he has been having increasing shortness of breath leg swelling and abdominal distention so he decided come to the hospital.  In the ED, patient was noted to be tachypneic and tachycardic.  Labs showed mild leukocytosis in BMP with hyponatremia.  D-dimer was elevated at 5.9.  UA was negative for UTI.  Chest x-ray showed no acute abnormality and improved aeration.  Patient was given breathing treatment and was admitted to hospital for further evaluation and treatment.    Assessment and plan  Principal Problem: PEA arrest Aspiration PNA COPD exacerbation (HCC) Acute on chronic combined CHF Active Problems: Hyponatremia History of ETOH abuse Tobacco abuse Essential hypertension Leukocytosis Elevated d-dimer Fall      Acute exacerbation of COPD -In the setting of continued tobacco abuse and medication noncompliance most likely -Patient ended experiencing PEA arrest with further worsening respiratory status and required intubation. -After further discussion with palliative care and also pulmonology/critical care service decision has been made for terminal extubation with transitioning to full comfort for and symptomatic management only. -Currently demonstrating agonal breathing; positive rhonchi and expiratory wheezing appreciated on examination. -Continue symptomatic management and the use of opiates for air hunger and  shortness of breath. -Life expectancy hours to days; hospital death appears to be intermittent.  Acute on Chronic combined systolic and diastolic CHF -2D echocardiogram done in January 2023 showed LVEF of 30% severe hypokinesis of the inferior, distal, anterior, apical, distal anterior, septal walls.  Mild LVH.  LV diastolic parameters were indeterminate.   -Chest x-ray without any obvious congestion.  BNP elevated> 4,500 (same as 5/15-day of discharge).  -Patient was actively treated with IV diuresis -Repeat echo 12/28/2021 demonstrating decreased ejection fraction to 2025%.  Patient with extensive medical noncompliance and admissions that resulted in leaving [facility]. -At this moment we will continue symptomatic management and supportive care.   -Left aspect of the hours to days; continue comfort care.   -Overall prognosis is poor; hospital death is Administrator, arts.  Alcohol withdrawal/alcohol abuse -History of drinking 12 packs of beer every day.     -Continue the use of Ativan and opiates as part of symptomatic management.  No signs of acute withdrawal currently.  Chronic hyponatremia possibly secondary to beer potomania/diuretic use. -Last time check his sodium was 135; Na 128 on presentation.  -No more blood work/labs anticipated; planning for comfort care and symptomatic management only.  Mild hypokalemia improved with replacement.   -Focusing now on comfort care only; no more blood draws will be done.  Leukocytosis possibly reactive.  Latest WBC at 22.6.  On steroids.  -No more labs anticipated; will focus on comfort care and symptomatic management only.   Elevated D-dimer -D-dimer 5.90, CTA chest showed no evidence of pulmonary embolism but dilatation of the ascending aorta at 4.2 cm which was stable.  Recommended annual imaging.  Large right and small left pleural effusion with spiculated opacity in the right apex appreciated. -Continue symptomatic  management.    Mechanical fall -Had a fall in the ED as well.  CT head unremarkable.  CT C-spine without any acute findings.  -Continue as needed analgesics and symptomatic management.  Type 2 diabetes mellitus -Last A1c 5.4 -Focusing on symptomatic management only.   History of CAD -All medications not intended to pursued: 4 and symptomatic management has been discontinue -plan is for comfort care only   Essential hypertension -All medications not intended to pursued: 4 and symptomatic management has been discontinue -plan is for comfort care only  Tobacco abuse -Continue nicotine patch.  Deconditioning debility frailty.   -Due to advanced heart failure noncompliance ongoing alcohol abuse further decompensation of COPD.   -patient has been transitioned to full comfort care and symptomatic management.    Subjective:  Unresponsive, not following commands, no eating, not drinking, demonstrating agonal breathing.  Chronically ill in appearance.  Physical Exam: Vitals:   12/30/21 1300 12/30/21 1345 12/30/21 1400 12/30/21 2027  BP: 102/85  (!) 89/71 108/89  Pulse: 88 (!) 101 95 100  Resp: 18 (!) 26 (!) 23 15  Temp:      TempSrc:      SpO2: 96% 91% (!) 87% 91%  Weight:      Height:       General exam: unresponsive; with agonal breath. Respiratory system: positive rhonchi and mild expiratory wheezing.  Cardiovascular system:Rate controlled, no rubs, no gallops. Gastrointestinal system: Abdomen is nondistended, soft and nontender. Positive bowel sounds. Central nervous system: Unable to properly assess due to unresponsive state. Extremities: No cyanosis or clubbing; positive trace edema. Skin: No petechiae. Psychiatry: Unable to assess currently due to unresponsive state.  Data Reviewed:   Family Communication: no family at bedside.  Disposition: Status is: Inpatient Remains inpatient appropriate because: End-of-life care; actively dying.   Planned Discharge Destination:  For  comfort care/symptomatic management only; patient with presence of agonal breathing and anticipated hospital death.   Time spent: 30 minutes  Author: Barton Dubois, MD 12/31/2021 11:41 AM  For on call review [email or web address].

## 2021-12-31 NOTE — Progress Notes (Signed)
Daily Progress Note   Patient Name: Francisco Robinson       Date: 12/31/2021 DOB: 06-25-1961  Age: 61 y.o. MRN#: [id number] Attending Physician: Barton Dubois, MD Primary Care Physician: Carrolyn Meiers, MD Admit Date: 12/27/2021  Reason for Consultation/Follow-up: Establishing goals of care and Terminal Care  Patient Profile/HPI:   61 y.o. male  with past medical history of anxiety, arthritis, seizures from alcohol withdrawal, remote stroke with memory deficits, COPD, DM2, CHF-echo in January 2023 shows ejection fraction of 30%, ETOH dependence. He was recently hospitalized 5/11-5/15 for acute on chronic CHF.  For that he was hospitalized on 5/6-12/15/2021 and he left AGAINST MEDICAL ADVICE.  On chart review he has had an hospitalist visit for admission every month since November 2022.  He is now admitted on 12/17/2021 with COPD/CHF exacerbation with shortness of breath, increased leg swelling, abdominal distention, productive cough, and hyponatremia.  He developed alcohol withdrawal with agitation, on CIWA.  He was found to be frail and severely deconditioned.  On 5/22 he was found unresponsive, pulseless and not breathing and appeared to had aspirated on his food tray.  CODE BLUE was called, received CPR with ROSC within 4 minutes and required intubation he underwent bronc for removal of debris.  Palliative medicine consulted for above.  Subjective: Evaluated patient- actively dying, breathing pattern appears somewhat distressed- better after lorazepam and fentanyl boluses.  Friends at bedside- emotional support given- answered their questions within the confines of HIPPA.  Medication use reviewed-  fentanyl infusing at 54mg/hr, 3 536m boluses in the last 24 hours- no indication for increasing  infusion rate at this time.   Review of Systems  Unable to perform ROS: Acuity of condition    Physical Exam Vitals and nursing note reviewed.  Constitutional:      Comments: Actively dying            Vital Signs: BP 108/89 (BP Location: Right Arm)   Pulse 100   Temp 97.7 F (36.5 C) (Oral)   Resp 15   Ht '5\' 7"'$  (1.702 m)   Wt 64.5 kg   SpO2 91%   BMI 22.27 kg/m  SpO2: SpO2: 91 % O2 Device: O2 Device: Room Air O2 Flow Rate: O2 Flow Rate (L/min): 2 L/min  Intake/output summary:  Intake/Output Summary (Last 24 hours) at 12/31/2021  [place] filed at 12/31/2021 0420 Gross per 24 hour  Intake 172.5 ml  Output 1700 ml  Net -1527.5 ml   LBM: Last BM Date : 01/03/2022 Baseline Weight: Weight: 61.1 kg Most recent weight: Weight: 64.5 kg       Palliative Assessment/Data: PPS: 10%      Patient Active Problem List   Diagnosis Date Noted   Aspiration into airway    Cardiac arrest, cause unspecified ([place])    Leukocytosis 12/27/2021   Elevated d-dimer 12/27/2021   Fall 12/27/2021   Acute exacerbation of chronic obstructive pulmonary disease (COPD) ([place]) 12/27/2021   Transaminitis 12/18/2021   Elevated troponin 12/13/2021   Elevated brain natriuretic peptide (BNP) level 12/13/2021   Essential hypertension 12/13/2021   CHF exacerbation ([place]) 11/24/2021   Mixed hyperlipidemia 11/24/2021   Acute on chronic systolic (congestive) heart failure ([place]) 10/28/2021   Hyponatremia 10/28/2021   Tobacco abuse 10/28/2021   Acute on chronic combined systolic and diastolic CHF (congestive heart failure) ([place]) 10/05/2021   COPD exacerbation ([place]) 08/17/2021   Noncompliance with medication regimen 08/17/2021   Cardiomyopathy ([place]) 07/07/2021   Pneumonia 07/05/2021   Delirium tremens/AlcoHol Withdrawal 06/17/2021   Acute respiratory failure with hypoxia ([place]) 06/14/2021   History of stroke 06/14/2021   Seizure ([place]) 06/14/2021   History of MI (myocardial infarction) 06/14/2021    History of ETOH abuse 06/14/2021   Diabetes mellitus without complication ([place]) 70/78/6754   Hepatic cirrhosis ([place]) 06/11/2014   Constipation 06/11/2014   Alcohol abuse 07/15/2011   Chronic pain due to trauma 07/15/2011    Palliative Care Assessment & Plan    Assessment/Recommendations/Plan  Actively dying- continue current comfort interventions- medications reviewed- no needs for changes at this time Anticipate hospital death within hours to days   Code Status: DNR  Prognosis:  Hours - Days  Discharge Planning: Anticipated Hospital Death  Care plan was discussed with care team.  Thank you for allowing the Palliative Medicine Team to assist in the care of this patient.   Greater than 50%  of this time was spent counseling and coordinating care related to the above assessment and plan.  Mariana Kaufman, AGNP-C Palliative Medicine   Please contact Palliative Medicine Team phone at [phone] for questions and concerns.

## 2022-01-01 DIAGNOSIS — T17908A Unspecified foreign body in respiratory tract, part unspecified causing other injury, initial encounter: Secondary | ICD-10-CM | POA: Diagnosis not present

## 2022-01-01 DIAGNOSIS — J441 Chronic obstructive pulmonary disease with (acute) exacerbation: Secondary | ICD-10-CM | POA: Diagnosis not present

## 2022-01-01 DIAGNOSIS — F10939 Alcohol use, unspecified with withdrawal, unspecified: Secondary | ICD-10-CM | POA: Diagnosis not present

## 2022-01-01 DIAGNOSIS — I469 Cardiac arrest, cause unspecified: Secondary | ICD-10-CM | POA: Diagnosis not present

## 2022-01-01 DIAGNOSIS — Z515 Encounter for palliative care: Secondary | ICD-10-CM | POA: Diagnosis not present

## 2022-01-01 MED ORDER — GLYCOPYRROLATE 0.2 MG/ML IJ SOLN
0.4000 mg | INTRAMUSCULAR | Status: DC
Start: 2022-01-01 — End: 2022-01-02
  Administered 2022-01-01 – 2022-01-02 (×7): 0.4 mg via INTRAVENOUS
  Filled 2022-01-01 (×7): qty 2

## 2022-01-01 MED FILL — Medication: Qty: 1 | Status: AC

## 2022-01-01 NOTE — Progress Notes (Signed)
Progress Note   Patient: Francisco Robinson QAS:[id number] DOB: September 21, 1960 DOA: 01/07/2022     5 DOS: the patient was seen and examined on 01/01/2022   Brief hospital course:  Aragorn Recker is a 61 y.o. male with past medical history of COPD, type 2 diabetes, systolic congestive heart failure, history of tobacco and alcohol abuse with previous admissions to the hospital presented to the hospital with shortness of breath, increased leg swelling.  He was recently admitted between 5 11-5/ 15 for acute on chronic combined systolic and diastolic heart failure but since discharge he has been having increasing shortness of breath leg swelling and abdominal distention so he decided come to the hospital.  In the ED, patient was noted to be tachypneic and tachycardic.  Labs showed mild leukocytosis in BMP with hyponatremia.  D-dimer was elevated at 5.9.  UA was negative for UTI.  Chest x-ray showed no acute abnormality and improved aeration.  Patient was given breathing treatment and was admitted to hospital for further evaluation and treatment.    Assessment and plan  Principal Problem: PEA arrest Aspiration PNA COPD exacerbation (HCC) Acute on chronic combined CHF Active Problems: Hyponatremia History of ETOH abuse Tobacco abuse Essential hypertension Leukocytosis Elevated d-dimer Fall      Acute exacerbation of COPD -In the setting of continued tobacco abuse and medication noncompliance most likely -Patient ended experiencing PEA arrest with further worsening respiratory status and required intubation. -After further discussion with palliative care and also pulmonology/critical care service decision has been made for terminal extubation with transitioning to full comfort for and symptomatic management only. -Currently demonstrating agonal breathing; positive rhonchi and expiratory wheezing appreciated on examination. -Continue symptomatic management and the use of opiates for air hunger and  shortness of breath. -Life expectancy hours to days; hospital death appears to be intermittent.  Acute on Chronic combined systolic and diastolic CHF -2D echocardiogram done in January 2023 showed LVEF of 30% severe hypokinesis of the inferior, distal, anterior, apical, distal anterior, septal walls.  Mild LVH.  LV diastolic parameters were indeterminate.   -Chest x-ray without any obvious congestion.  BNP elevated> 4,500 (same as 5/15-day of discharge).  -Patient was actively treated with IV diuresis -Repeat echo 12/28/2021 demonstrating decreased ejection fraction to 2025%.  Patient with extensive medical noncompliance and admissions that resulted in leaving [facility]. -At this moment we will continue symptomatic management and supportive care.   -Left aspect of the hours to days; continue comfort care.   -Overall prognosis is poor; hospital death is eminent. -Patient is actively dying.  Alcohol withdrawal/alcohol abuse -History of drinking 12 packs of beer every day.     -Continue the use of Ativan and opiates as part of symptomatic management.  No signs of acute withdrawal currently.  Chronic hyponatremia possibly secondary to beer potomania/diuretic use. -Last time check his sodium was 135; Na 128 on presentation.  -No more blood work/labs anticipated; planning for comfort care and symptomatic management only.  Mild hypokalemia improved with replacement.   -Focusing now on comfort care only; no more blood draws will be done.  Leukocytosis possibly reactive.  Latest WBC at 22.6.  On steroids.  -No more labs anticipated; will focus on comfort care and symptomatic management only.   Elevated D-dimer -D-dimer 5.90, CTA chest showed no evidence of pulmonary embolism but dilatation of the ascending aorta at 4.2 cm which was stable.  Recommended annual imaging.  Large right and small left pleural effusion with spiculated opacity in the right  apex appreciated. -Continue  symptomatic management.   Mechanical fall -Had a fall in the ED as well.  CT head unremarkable.  CT C-spine without any acute findings.  -Continue as needed analgesics and symptomatic management.  Type 2 diabetes mellitus -Last A1c 5.4 -Focusing on symptomatic management only.   History of CAD -All medications not intended to pursued: 4 and symptomatic management has been discontinue -plan is for comfort care only   Essential hypertension -All medications not intended to pursued: 4 and symptomatic management has been discontinue -plan is for comfort care only  Tobacco abuse -Continue nicotine patch.  Deconditioning debility frailty.   -Due to advanced heart failure noncompliance ongoing alcohol abuse further decompensation of COPD.   -patient has been transitioned to full comfort care and symptomatic management.  -Patient actively dying.  We will continue comfort care.   Subjective:  Unresponsive, afebrile, no nausea, no vomiting, no following commands.  Patient is no eating or drinking.  Chronically ill and demonstrating agonal breathing.  Physical Exam: Vitals:   12/30/21 1400 12/30/21 2027 12/31/21 1401 12/31/21 2018  BP: (!) 89/71 108/89 107/81 105/82  Pulse: 95 100 (!) 101 (!) 102  Resp: (!) '23 15 14 13  '$ Temp:   98.2 F (36.8 C) 97.9 F (36.6 C)  TempSrc:      SpO2: (!) 87% 91% 93% 97%  Weight:      Height:       General exam: Currently unresponsive; not following commands.  Experiencing intermittent episodes of agonal breathing. Respiratory system: Diffuse rhonchi bilaterally; mild expiratory wheezing.  No crackles on exam. Cardiovascular system: Rate controlled, no rubs, no gallops. Gastrointestinal system: Abdomen is nondistended, soft and nontender. No organomegaly or masses felt. Normal bowel sounds heard. Central nervous system: Unable to assess in current mental status. Extremities: No cyanosis or clubbing.  Positive trace edema appreciated  bilaterally. Skin: No petechiae. Psychiatry: Unable to assess due to unresponsive state.   Data Reviewed: No new data to reviewed. Patient transitioned to full comfort care and symptomatic management only. No more blood draws anticipated.  Family Communication: no family at bedside.  Disposition: Status is: Inpatient  Remains inpatient appropriate because: End-of-life care; actively dying.   Planned Discharge Destination:  For comfort care/symptomatic management only; patient with presence of agonal breathing and anticipated hospital death.    Author: Barton Dubois, MD 01/01/2022 9:09 AM  For on call review [email or web address].

## 2022-01-01 NOTE — Progress Notes (Signed)
Daily Progress Note   Patient Name: Francisco Robinson       Date: 01/01/2022 DOB: [birth year]  Age: 61 y.o. MRN#: [id number] Attending Physician: Barton Dubois, MD Primary Care Physician: Carrolyn Meiers, MD Admit Date: 01/03/2022  Reason for Consultation/Follow-up: Establishing goals of care and Terminal Care  Patient Profile/HPI:   61 y.o. male  with past medical history of anxiety, arthritis, seizures from alcohol withdrawal, remote stroke with memory deficits, COPD, DM2, CHF-echo in January 2023 shows ejection fraction of 30%, ETOH dependence. He was recently hospitalized 5/11-5/15 for acute on chronic CHF.  For that he was hospitalized on 5/6-12/15/2021 and he left AGAINST MEDICAL ADVICE.  On chart review he has had an hospitalist visit for admission every month since November 2022.  He is now admitted on 12/22/2021 with COPD/CHF exacerbation with shortness of breath, increased leg swelling, abdominal distention, productive cough, and hyponatremia.  He developed alcohol withdrawal with agitation, on CIWA.  He was found to be frail and severely deconditioned.  On 5/22 he was found unresponsive, pulseless and not breathing and appeared to had aspirated on his food tray.  CODE BLUE was called, received CPR with ROSC within 4 minutes and required intubation he underwent bronc for removal of debris.  Palliative medicine consulted for above.  Subjective: Evaluated patient- actively dying.  Chart reviewed including progress notes and medication usage.  He is having ongoing terminal secretions despite 0.2 mcg of Robinul approximately every 4 hours.  No bolus doses of fentanyl given.  Fentanyl IV currently infusing at 100 mcg/h.   Review of Systems  Unable to perform ROS: Acuity of condition     Physical Exam Vitals and nursing note reviewed.  Constitutional:      Comments: Actively dying  HENT:     Nose:     Comments: Flattened nasolabial folds Pulmonary:     Comments: Cheyne-stokes Audible terminal secretions Neurological:     Comments: Unresponsive, loss of jaw muscle motor control            Vital Signs: BP 105/82 (BP Location: Left Arm)   Pulse (!) 102   Temp 97.9 F (36.6 C)   Resp 13   Ht '5\' 7"'$  (1.702 m)   Wt 64.5 kg   SpO2 97%   BMI 22.27 kg/m  SpO2: SpO2: 97 %  O2 Device: O2 Device: Room Air O2 Flow Rate: O2 Flow Rate (L/min): 2 L/min  Intake/output summary:  Intake/Output Summary (Last 24 hours) at 01/01/2022 1215 Last data filed at 01/01/2022 0600 Gross per 24 hour  Intake 76.1 ml  Output 1050 ml  Net -973.9 ml    LBM: Last BM Date : 12/28/2021 Baseline Weight: Weight: 61.1 kg Most recent weight: Weight: 64.5 kg       Palliative Assessment/Data: PPS: 10%      Patient Active Problem List   Diagnosis Date Noted   Aspiration into airway    Cardiac arrest, cause unspecified ([place])    Leukocytosis 12/27/2021   Elevated d-dimer 12/27/2021   Fall 12/27/2021   Acute exacerbation of chronic obstructive pulmonary disease (COPD) ([place]) 12/27/2021   Transaminitis 12/18/2021   Elevated troponin 12/13/2021   Elevated brain natriuretic peptide (BNP) level 12/13/2021   Essential hypertension 12/13/2021   CHF exacerbation ([place]) 11/24/2021   Mixed hyperlipidemia 11/24/2021   Acute on chronic systolic (congestive) heart failure ([place]) 10/28/2021   Hyponatremia 10/28/2021   Tobacco abuse 10/28/2021   Acute on chronic combined systolic and diastolic CHF (congestive heart failure) ([place]) 10/05/2021   COPD exacerbation ([place]) 08/17/2021   Noncompliance with medication regimen 08/17/2021   Cardiomyopathy ([place]) 07/07/2021   Pneumonia 07/05/2021   Delirium tremens/AlcoHol Withdrawal 06/17/2021   Acute respiratory failure with hypoxia ([place]) 06/14/2021    History of stroke 06/14/2021   Seizure ([place]) 06/14/2021   History of MI (myocardial infarction) 06/14/2021   History of ETOH abuse 06/14/2021   Diabetes mellitus without complication ([place]) 97/41/6384   Hepatic cirrhosis ([place]) 06/11/2014   Constipation 06/11/2014   Alcohol abuse 07/15/2011   Chronic pain due to trauma 07/15/2011    Palliative Care Assessment & Plan    Assessment/Recommendations/Plan  Actively dying- continue current comfort interventions- medications reviewed Increase robinul IV .44mg q4hr for terminal secretions Anticipate hospital death within hours to days   Code Status: DNR  Prognosis:  Hours - Days  Discharge Planning: Anticipated Hospital Death  Care plan was discussed with care team.  Thank you for allowing the Palliative Medicine Team to assist in the care of this patient.   Greater than 50%  of this time was spent counseling and coordinating care related to the above assessment and plan.  KMariana Kaufman AGNP-C Palliative Medicine   Please contact Palliative Medicine Team phone at [phone] questions and concerns.

## 2022-01-02 DIAGNOSIS — F10939 Alcohol use, unspecified with withdrawal, unspecified: Secondary | ICD-10-CM | POA: Diagnosis not present

## 2022-01-02 DIAGNOSIS — T17908D Unspecified foreign body in respiratory tract, part unspecified causing other injury, subsequent encounter: Secondary | ICD-10-CM

## 2022-01-02 DIAGNOSIS — I469 Cardiac arrest, cause unspecified: Secondary | ICD-10-CM | POA: Diagnosis not present

## 2022-01-02 DIAGNOSIS — J441 Chronic obstructive pulmonary disease with (acute) exacerbation: Secondary | ICD-10-CM | POA: Diagnosis not present

## 2022-01-02 MED ORDER — SCOPOLAMINE 1 MG/3DAYS TD PT72
1.0000 | MEDICATED_PATCH | TRANSDERMAL | Status: DC
  Administered 2022-01-02: 1.5 mg via TRANSDERMAL
  Filled 2022-01-02: qty 1

## 2022-01-08 NOTE — Death Summary Note (Signed)
DEATH SUMMARY   Patient Details  Name: Francisco Robinson MRN: [id number] DOB: 1961-05-05 YYT:KPTWS, Francisco Baxter, MD Admission/Discharge Information   Admit Date:  23-Jan-2022  Date of Death: Date of Death: 30-Jan-2022  Time of Death: Time of Death: 1330  Length of Stay: 6   Principle Cause of death: Aspiration into airway.  Hospital Diagnoses: Principal Problem:   COPD exacerbation ([place]) Active Problems:   Hyponatremia   History of ETOH abuse   Tobacco abuse   Essential hypertension   Leukocytosis   Elevated d-dimer   Fall   Acute exacerbation of chronic obstructive pulmonary disease (COPD) ([place])   Aspiration into airway   Cardiac arrest, cause unspecified [facility])   Hospital Course:  Francisco Robinson is a 61 y.o. male with past medical history of COPD, type 2 diabetes, systolic congestive heart failure, history of tobacco and alcohol abuse with previous admissions to the hospital presented to the hospital with shortness of breath, increased leg swelling.  He was recently admitted between 5 11-5/ 15 for acute on chronic combined systolic and diastolic heart failure but since discharge he has been having increasing shortness of breath leg swelling and abdominal distention so he decided come to the hospital.  In the ED, patient was noted to be tachypneic and tachycardic.  Labs showed mild leukocytosis in BMP with hyponatremia.  D-dimer was elevated at 5.9.  UA was negative for UTI.  Chest x-ray showed no acute abnormality and improved aeration.  Patient was given breathing treatment and was admitted to hospital for further evaluation and treatment.    Assessment and plan  Principal Problem:   COPD exacerbation ([place]) Active Problems:   Hyponatremia   History of ETOH abuse with alcohol withdrawal.   Tobacco abuse   Essential hypertension   Leukocytosis   Elevated d-dimer   Fall   Acute exacerbation of chronic obstructive pulmonary disease (COPD) ([facility])   Acute exacerbation of  COPD -In the setting of continued tobacco abuse and medication noncompliance most likely -Patient ended experiencing PEA arrest with further worsening respiratory status and required intubation. -After further discussion with palliative care and also pulmonology/critical care service decision has been made for terminal extubation with transitioning to full comfort for and symptomatic management only. -Currently demonstrating agonal breathing; positive rhonchi and expiratory wheezing appreciated on examination. -Continue symptomatic management and the use of opiates for air hunger and shortness of breath. -Life expectancy hours to days; hospital death appears to be intermittent.  Acute on Chronic combined systolic and diastolic CHF -2D echocardiogram done in January 2023 showed LVEF of 30% severe hypokinesis of the inferior, distal, anterior, apical, distal anterior, septal walls.  Mild LVH.  LV diastolic parameters were indeterminate.   -Chest x-ray without any obvious congestion.  BNP elevated> 4,500 (same as 5/15-day of discharge).  -Patient was actively treated with IV diuresis -Repeat echo 12/28/2021 demonstrating decreased ejection fraction to 2025%.  Patient with extensive medical noncompliance and admissions that resulted in leaving [place]. -At this moment we will continue symptomatic management and supportive care.   -Left aspect of the hours to days; continue comfort care.   -Overall prognosis is poor; hospital death is eminent. -Patient is actively dying.   Alcohol withdrawal/alcohol abuse -History of drinking 12 packs of beer every day.     -Continue the use of Ativan and opiates as part of symptomatic management.  No signs of acute withdrawal currently.  Chronic hyponatremia possibly secondary to beer potomania/diuretic use. -Last time check his sodium  was 135; Na 128 on presentation.  -No more blood work/labs anticipated; planning for comfort care and symptomatic  management only.   Mild hypokalemia improved with replacement.   -Focusing now on comfort care only; no more blood draws will be done.  Leukocytosis possibly reactive.  Latest WBC at 22.6.  On steroids.  -No more labs anticipated; will focus on comfort care and symptomatic management only.   Elevated D-dimer -D-dimer 5.90, CTA chest showed no evidence of pulmonary embolism but dilatation of the ascending aorta at 4.2 cm which was stable.  Recommended annual imaging.  Large right and small left pleural effusion with spiculated opacity in the right apex appreciated. -Continue symptomatic management.   Mechanical fall -Had a fall in the ED as well.  CT head unremarkable.  CT C-spine without any acute findings.  -Continue as needed analgesics and symptomatic management.   Type 2 diabetes mellitus -Last A1c 5.4 -Focusing on symptomatic management only.   History of CAD -All medications not intended to pursued: 4 and symptomatic management has been discontinue -plan is for comfort care only   Essential hypertension -All medications not intended to pursued: 4 and symptomatic management has been discontinue -plan is for comfort care only   Tobacco abuse -Continue nicotine patch.   Deconditioning debility frailty.   -Due to advanced heart failure noncompliance ongoing alcohol abuse further decompensation of COPD.   -patient has been transitioned to full comfort care and symptomatic management.  -Patient actively dying.  We will continue comfort care.  Patient peacefully expired at 1330 on 2022-01-17.  Procedures: See below for x-ray reports.  Consultations: PCCM, palliative care  The results of significant diagnostics from this hospitalization (including imaging, microbiology, ancillary and laboratory) are listed below for reference.   Significant Diagnostic Studies: CT Head Wo Contrast  Result Date: 12/27/2021 CLINICAL DATA:  Fall with facial and head trauma. EXAM: CT HEAD  WITHOUT CONTRAST CT MAXILLOFACIAL WITHOUT CONTRAST CT CERVICAL SPINE WITHOUT CONTRAST TECHNIQUE: Multidetector CT imaging of the head, cervical spine, and maxillofacial structures were performed using the standard protocol without intravenous contrast. Multiplanar CT image reconstructions of the cervical spine and maxillofacial structures were also generated. RADIATION DOSE REDUCTION: This exam was performed according to the departmental dose-optimization program which includes automated exposure control, adjustment of the mA and/or kV according to patient size and/or use of iterative reconstruction technique. COMPARISON:  None Available. FINDINGS: CT HEAD FINDINGS Brain: Patient motion limits the study particularly through the middle and posterior fossae. There is moderate age-advanced cerebral atrophy, small-vessel disease and atrophic ventriculomegaly, mild cerebellar atrophy. Allowing for motion no obvious acute infarct, hemorrhage or mass are seen and there is no midline shift. Basal cisterns are clear. Vascular: There are scattered calcifications in the carotid siphons but no hyperdense central vessels. Skull: The calvarium and skull base are intact. No focal skull lesion is seen. There is asymmetric swelling of the right temporal and anterior right frontal scalp. Other: Visualized mastoid air cells are clear. There is pneumatization of both petrous apices. CT MAXILLOFACIAL FINDINGS Osseous: No fracture or mandibular dislocation. No destructive process. The patient is edentulous. Orbits: There is a shallow depressed fracture of the medial wall of the left orbit which is most likely chronic because there is no soft tissue reaction or adjacent left ethmoid sinus opacification. No acute traumatic findings seen in either orbit. Right globe is unremarkable. 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 Sinuses: Clear. There is an S shaped nasal septum with prominent left-sided septal spurring. There is a concha bullosa of the right middle turbinate and of the left superior turbinate. The nasal passages are unobstructed. Soft tissues: Negative. CT CERVICAL SPINE FINDINGS Two attempts were made at imaging.  Both are motion limited. Alignment: There is a mild cervical levoscoliosis. There is a minimal grade 1 anterolisthesis of C4 on 5 probably degenerative. No traumatic listhesis is seen. The alignment is otherwise normal. Skull base and vertebrae: There is mild osteopenia. No spinal compression injury is seen. There is no obvious displaced fracture allowing for motion. A subtle fracture would be missed. No focal bone lesion is seen through the motion artifact. Soft tissues and spinal canal: No prevertebral fluid or swelling. No obvious visible canal hematoma. Proximal right cervical ICA appears to be heavily calcified but is not well seen. Disc levels: There is mild disc space loss, with bidirectional endplate osteophytes at C4-5, C5-6 and C6-7 mildly encroaching on the ventral cord surface without frank compression. There is multilevel facet joint and uncinate hypertrophy. Acquired degenerative foraminal stenosis is mild-to-moderate on the left at C3-4, bilaterally severe at C4-5, bilaterally mild C5-6 and moderate to severe on the left and mild on the right at C6-7. Upper chest: There is a moderate layering right pleural effusion. There is a spiculated lesion measuring 2.5 x 1.4 cm in the right lung apex. Other: None. IMPRESSION: 1. All studies are motion limited to varying degrees. 2. No acute intracranial CT findings or depressed skull fracture allowing for motion. 3. 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 4. Clear sinuses with S shaped nasal septum with left-sided spurring. 5. Osteopenia, scoliosis and  degenerative changes of the cervical spine without evidence of fractures allowing for motion. 6. Irregular spiculated lesion in the right lung apex. Further evaluation recommended. Moderate right pleural effusion. Electronically Signed   By: Telford Nab M.D.   On: 12/27/2021 04:12   CT Angio Chest PE W and/or Wo Contrast  Result Date: 12/28/2021 CLINICAL DATA:  Pulmonary embolism (PE) suspected, positive D-dimer. Dyspnea, productive cough EXAM: CT ANGIOGRAPHY CHEST WITH CONTRAST TECHNIQUE: Multidetector CT imaging of the chest was performed using the standard protocol during bolus administration of intravenous contrast. Multiplanar CT image reconstructions and MIPs were obtained to evaluate the vascular anatomy. RADIATION DOSE REDUCTION: This exam was performed according to the departmental dose-optimization program which includes automated exposure control, adjustment of the mA and/or kV according to patient size and/or use of iterative reconstruction technique. CONTRAST:  1m OMNIPAQUE IOHEXOL 350 MG/ML SOLN COMPARISON:  10/07/2021 FINDINGS: Cardiovascular: There is adequate opacification of the pulmonary arterial tree. No intraluminal filling defect identified to suggest acute pulmonary embolism. The central pulmonary arteries are enlarged in keeping with changes of pulmonary arterial hypertension. Mild multi-vessel coronary artery calcification. Mild global cardiomegaly. Calcification of the aortic valve leaflets noted. No pericardial effusion. Mild atherosclerotic calcification noted within the thoracic aorta. The ascending aorta is mildly dilated measuring 4.2 cm in greatest dimension. The descending thoracic aorta is of normal caliber. Mediastinum/Nodes: No enlarged mediastinal, hilar, or axillary lymph nodes. Thyroid gland, trachea, and esophagus demonstrate no significant findings. Lungs/Pleura: Motion artifact limits evaluation of the pulmonary parenchyma. Moderate right and small left pleural  effusions have developed with associated bibasilar compressive atelectasis. Spiculated opacity has developed within the right apex measuring roughly 12 x 24 mm at axial image #  39/7, possibly infectious or inflammatory in the acute setting. Previously noted ground-glass pulmonary infiltrates within the left upper lobe have resolved. No pneumothorax. No central obstructing lesion. Upper Abdomen: No acute abnormality. Musculoskeletal: No acute bone abnormality. Osseous structures are age-appropriate though evaluation is slightly limited by motion artifact. Review of the MIP images confirms the above findings. IMPRESSION: No pulmonary embolism. Mild coronary artery calcification. Mild global cardiomegaly. Morphologic changes in keeping with pulmonary arterial hypertension. 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 Recommend annual imaging followup by CTA or MRA. This recommendation follows 2010 ACCF/AHA/AATS/ACR/ASA/SCA/SCAI/SIR/STS/SVM Guidelines for the Diagnosis and Management of Patients with Thoracic Aortic Disease. Circulation. 2010; 121: P710-G269. Aortic aneurysm NOS (ICD10-I71.9) Large right and small left pleural effusions. Spiculated opacity within the right apex, new since prior examination, possibly infectious or inflammatory in the acute setting. Short-term follow-up evaluation in 3 months, once the patient's acute issues have resolved, would be helpful in documenting resolution. Aortic Atherosclerosis (ICD10-I70.0). Electronically Signed   By: Fidela Salisbury M.D.   On: 12/28/2021 00:43   CT Cervical Spine Wo Contrast  Result Date: 12/27/2021 CLINICAL DATA:  Fall with facial and head trauma. EXAM: CT HEAD WITHOUT CONTRAST CT MAXILLOFACIAL WITHOUT CONTRAST CT CERVICAL SPINE WITHOUT CONTRAST TECHNIQUE: Multidetector CT imaging of the head, cervical spine, and maxillofacial structures were performed using the standard protocol without  intravenous contrast. Multiplanar CT image reconstructions of the cervical spine and maxillofacial structures were also generated. RADIATION DOSE REDUCTION: This exam was performed according to the departmental dose-optimization program which includes automated exposure control, adjustment of the mA and/or kV according to patient size and/or use of iterative reconstruction technique. COMPARISON:  None Available. FINDINGS: CT HEAD FINDINGS Brain: Patient motion limits the study particularly through the middle and posterior fossae. There is moderate age-advanced cerebral atrophy, small-vessel disease and atrophic ventriculomegaly, mild cerebellar atrophy. Allowing for motion no obvious acute infarct, hemorrhage or mass are seen and there is no midline shift. Basal cisterns are clear. Vascular: There are scattered calcifications in the carotid siphons but no hyperdense central vessels. Skull: The calvarium and skull base are intact. No focal skull lesion is seen. There is asymmetric swelling of the right temporal and anterior right frontal scalp. Other: Visualized mastoid air cells are clear. There is pneumatization of both petrous apices. CT MAXILLOFACIAL FINDINGS Osseous: No fracture or mandibular dislocation. No destructive process. The patient is edentulous. Orbits: There is a shallow depressed fracture of the medial wall of the left orbit which is most likely chronic because there is no soft tissue reaction or adjacent left ethmoid sinus opacification. No acute traumatic findings seen in either orbit. Right globe is unremarkable. 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 Sinuses: Clear. There is an S shaped nasal septum with prominent left-sided septal spurring. There is a concha bullosa of the right middle turbinate and of the left superior  turbinate. The nasal passages are unobstructed. Soft tissues: Negative. CT CERVICAL SPINE FINDINGS Two attempts were made at imaging.  Both are motion limited. Alignment: There is a mild cervical levoscoliosis. There is a minimal grade 1 anterolisthesis of C4 on 5 probably degenerative. No traumatic listhesis is seen. The alignment is otherwise normal. Skull base and vertebrae: There is mild osteopenia. No spinal compression injury is seen. There is no obvious displaced fracture allowing  for motion. A subtle fracture would be missed. No focal bone lesion is seen through the motion artifact. Soft tissues and spinal canal: No prevertebral fluid or swelling. No obvious visible canal hematoma. Proximal right cervical ICA appears to be heavily calcified but is not well seen. Disc levels: There is mild disc space loss, with bidirectional endplate osteophytes at C4-5, C5-6 and C6-7 mildly encroaching on the ventral cord surface without frank compression. There is multilevel facet joint and uncinate hypertrophy. Acquired degenerative foraminal stenosis is mild-to-moderate on the left at C3-4, bilaterally severe at C4-5, bilaterally mild C5-6 and moderate to severe on the left and mild on the right at C6-7. Upper chest: There is a moderate layering right pleural effusion. There is a spiculated lesion measuring 2.5 x 1.4 cm in the right lung apex. Other: None. IMPRESSION: 1. All studies are motion limited to varying degrees. 2. No acute intracranial CT findings or depressed skull fracture allowing for motion. 3. 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 4. Clear sinuses with S shaped nasal septum with left-sided spurring. 5. Osteopenia, scoliosis and degenerative changes of the cervical spine without evidence of fractures allowing for motion. 6. Irregular spiculated lesion in the right lung apex. Further evaluation recommended. Moderate right pleural effusion. Electronically Signed    By: Telford Nab M.D.   On: 12/27/2021 04:12   Portable Chest xray  Result Date: 12/30/2021 CLINICAL DATA:  Aspiration. EXAM: PORTABLE CHEST 1 VIEW COMPARISON:  Dec 29, 2021. FINDINGS: Stable cardiomediastinal silhouette. Endotracheal and nasogastric tubes are unchanged in position. Left internal jugular catheter is unchanged. Stable right perihilar opacity is noted concerning for edema or possibly pneumonia. Left lung is unremarkable. Bony thorax is unremarkable. IMPRESSION: Stable support apparatus. Stable right perihilar opacity as described above. Electronically Signed   By: Marijo Conception M.D.   On: 12/30/2021 08:15   Portable Chest x-ray  Result Date: 12/29/2021 CLINICAL DATA:  Shortness of breath EXAM: PORTABLE CHEST 1 VIEW COMPARISON:  Previous studies including the examination of 12/15/2021 FINDINGS: Transverse diameter of heart is increased. Central pulmonary vessels are more prominent, more so on the right side. This finding may be partly due to supine position. No new focal pulmonary infiltrates are seen. Lateral CP angles are clear. There is no pneumothorax. Tip endotracheal tube is 4 cm above the carina. IMPRESSION: Cardiomegaly. Central pulmonary vessels are prominent which may suggest mild CHF. Part of this finding may be due to supine position. There is no focal pulmonary consolidation. Electronically Signed   By: Elmer Picker M.D.   On: 12/29/2021 15:26   DG Chest Port 1 View  Result Date: 12/30/2021 CLINICAL DATA:  Shortness of breath EXAM: PORTABLE CHEST 1 VIEW COMPARISON:  12/21/2021 FINDINGS: Cardiac shadow is enlarged but stable. Aortic calcifications are again seen. Interval re-expansion of the left lower lobe is noted. No focal infiltrate is seen. Mild interstitial changes are again noted. No bony abnormality is seen. IMPRESSION: No acute abnormality noted. Improved aeration in the left base is noted. Electronically Signed   By: Inez Catalina M.D.   On: 12/12/2021 23:45    DG CHEST PORT 1 VIEW  Result Date: 12/21/2021 CLINICAL DATA:  Shortness of breath and chest pain. EXAM: PORTABLE CHEST 1 VIEW COMPARISON:  Shortness of breath and chest pain. FINDINGS: The cardio pericardial silhouette is enlarged. Retrocardiac collapse/consolidation is new in the interval. Ill-defined nodular opacities are seen in the right upper lung. Interstitial markings are diffusely coarsened with  chronic features. The visualized bony structures of the thorax are unremarkable. Telemetry leads overlie the chest. IMPRESSION: Retrocardiac collapse/consolidation, new in the interval. New ill-defined nodular opacities in the right upper lobe, likely infectious/inflammatory. Follow-up recommended to ensure resolution. Electronically Signed   By: Misty Stanley M.D.   On: 12/21/2021 10:15   DG Chest Port 1 View  Result Date: 12/18/2021 CLINICAL DATA:  Shortness of breath, chest pain, cough EXAM: PORTABLE CHEST 1 VIEW COMPARISON:  12/13/2021 FINDINGS: Cardiomegaly. Aortic atherosclerosis. Interstitial prominence throughout the lungs. No confluent airspace opacities or effusions. No acute bony abnormality. IMPRESSION: Stable chronic changes.  No active disease. Cardiomegaly. Electronically Signed   By: Rolm Baptise M.D.   On: 12/18/2021 19:18   DG Chest Portable 1 View  Result Date: 12/13/2021 CLINICAL DATA:  Sore throat EXAM: PORTABLE CHEST 1 VIEW COMPARISON:  11/24/2021 FINDINGS: The lungs are hyperinflated with diffuse interstitial prominence. No focal airspace consolidation or pulmonary edema. No pleural effusion or pneumothorax. Normal cardiomediastinal contours. IMPRESSION: COPD without acute airspace disease. Electronically Signed   By: Ulyses Jarred M.D.   On: 12/13/2021 02:49   DG Chest Port 1V same Day  Result Date: 12/29/2021 CLINICAL DATA:  Central line and OG tube placement EXAM: PORTABLE CHEST 1 VIEW COMPARISON:  Earlier same day FINDINGS: Endotracheal tube is 4.5 cm above the carina.  Enteric tube passes below the diaphragm with tip within the body of the stomach. Left IJ central line tip overlies the confluence of brachiocephalic veins. No pneumothorax. Similar interstitial prominence with patchy right perihilar density. Stable cardiomediastinal contours. Small right pleural effusion. IMPRESSION: Lines and tubes as above. No pneumothorax. Similar lung aeration. Small right pleural effusion. Electronically Signed   By: Macy Mis M.D.   On: 12/29/2021 17:30   [place] ASCITES (ABDOMEN LIMITED)  Result Date: 12/19/2021 CLINICAL DATA:  Ascites, history is cirrhosis EXAM: LIMITED ABDOMEN ULTRASOUND FOR ASCITES TECHNIQUE: Limited ultrasound survey for ascites was performed in all four abdominal quadrants. COMPARISON:  None FINDINGS: Small amount of perihepatic ascites. Small pocket of ascites in RIGHT lower quadrant. Minimal fluid in remainder of abdomen. Volume of fluid is insufficient for expected therapeutic benefit from paracentesis. IMPRESSION: Small volume ascites. Electronically Signed   By: Lavonia Dana M.D.   On: 12/19/2021 11:43   ECHOCARDIOGRAM LIMITED  Result Date: 12/28/2021    ECHOCARDIOGRAM LIMITED REPORT   Patient Name:   Francisco Robinson Date of Exam: 12/28/2021 Medical Rec #:  [id number]        Height:       67.0 in Accession #:    [id number]       Weight:       142.6 lb Date of Birth:  08/25/1960        BSA:          1.752 m Patient Age:    36 years         BP:           106/87 mmHg Patient Gender: M                HR:           96 bpm. Exam Location:  Inpatient Procedure: Limited Echo Indications:    acute systolic and diastolic chf  History:        Patient has prior history of Echocardiogram examinations, most                 recent 08/18/2021. COPD, Signs/Symptoms:Dyspnea; Risk  Factors:Diabetes, Current Smoker and Alcohol use.  Sonographer:    Johny Chess RDCS Referring Phys: [id number] OLADAPO ADEFESO IMPRESSIONS  1. Left ventricular ejection fraction, by  estimation, is 20 to 25%. The left ventricle has severely decreased function. The left ventricle demonstrates global hypokinesis. Left ventricular diastolic parameters are indeterminate.  2. Right ventricular systolic function is mildly reduced. The right ventricular size is mildly enlarged. There is moderately elevated pulmonary artery systolic pressure. The estimated right ventricular systolic pressure is 62.6 mmHg.  3. A small pericardial effusion is present.  4. The mitral valve is normal in structure. Mild mitral valve regurgitation.  5. Tricuspid valve regurgitation is moderate.  6. The inferior vena cava is dilated in size with <50% respiratory variability, suggesting right atrial pressure of 15 mmHg.  7. The aortic valve is calcified. There is severe calcifcation of the aortic valve. Severe aortic valve stenosis. MG only 70mHg, but markedly decreased SV. AVA 0.5 cm^2, DI 0.22. Consistent with low flow low gradient severe AS FINDINGS  Left Ventricle: Left ventricular ejection fraction, by estimation, is 20 to 25%. The left ventricle has severely decreased function. The left ventricle demonstrates global hypokinesis. The left ventricular internal cavity size was normal in size. There is no left ventricular hypertrophy. Left ventricular diastolic parameters are indeterminate. Right Ventricle: The right ventricular size is mildly enlarged. Right ventricular systolic function is mildly reduced. There is moderately elevated pulmonary artery systolic pressure. The tricuspid regurgitant velocity is 2.92 m/s, and with an assumed right atrial pressure of 15 mmHg, the estimated right ventricular systolic pressure is 494.8mmHg. Pericardium: A small pericardial effusion is present. Mitral Valve: The mitral valve is normal in structure. Mild mitral valve regurgitation. Tricuspid Valve: The tricuspid valve is normal in structure. Tricuspid valve regurgitation is moderate. Aortic Valve: The aortic valve is calcified. There  is severe calcifcation of the aortic valve. Severe aortic stenosis is present. Aortic valve mean gradient measures 11.0 mmHg. Aortic valve peak gradient measures 17.3 mmHg. Aortic valve area, by VTI measures 0.47 cm. Pulmonic Valve: The pulmonic valve was not well visualized. Pulmonic valve regurgitation is trivial. Aorta: The aortic root is normal in size and structure. Venous: The inferior vena cava is dilated in size with less than 50% respiratory variability, suggesting right atrial pressure of 15 mmHg. LEFT VENTRICLE PLAX 2D LVIDd:         5.60 cm   Diastology LVIDs:         4.80 cm   LV e' medial: 3.15 cm/s LV PW:         1.00 cm LV IVS:        1.00 cm LVOT diam:     1.70 cm LV SV:         19 LV SV Index:   11 LVOT Area:     2.27 cm  IVC IVC diam: 2.40 cm LEFT ATRIUM         Index LA diam:    4.00 cm 2.28 cm/m  AORTIC VALVE AV Area (Vmax):    0.46 cm AV Area (Vmean):   0.41 cm AV Area (VTI):     0.47 cm AV Vmax:           208.00 cm/s AV Vmean:          162.000 cm/s AV VTI:            0.414 m AV Peak Grad:      17.3 mmHg AV Mean Grad:  11.0 mmHg LVOT Vmax:         41.80 cm/s LVOT Vmean:        29.200 cm/s LVOT VTI:          0.086 m LVOT/AV VTI ratio: 0.21  AORTA Ao Asc diam: 3.70 cm TRICUSPID VALVE TR Peak grad:   34.1 mmHg TR Vmax:        292.00 cm/s  SHUNTS Systemic VTI:  0.09 m Systemic Diam: 1.70 cm Oswaldo Milian MD Electronically signed by Oswaldo Milian MD Signature Date/Time: 12/28/2021/1:47:40 PM    Final    [place] EKG SITE RITE  Result Date: 12/29/2021 If Site Rite image not attached, placement could not be confirmed due to current cardiac rhythm.  CT Maxillofacial Wo Contrast  Result Date: 12/27/2021 CLINICAL DATA:  Fall with facial and head trauma. EXAM: CT HEAD WITHOUT CONTRAST CT MAXILLOFACIAL WITHOUT CONTRAST CT CERVICAL SPINE WITHOUT CONTRAST TECHNIQUE: Multidetector CT imaging of the head, cervical spine, and maxillofacial structures were performed using the standard  protocol without intravenous contrast. Multiplanar CT image reconstructions of the cervical spine and maxillofacial structures were also generated. RADIATION DOSE REDUCTION: This exam was performed according to the departmental dose-optimization program which includes automated exposure control, adjustment of the mA and/or kV according to patient size and/or use of iterative reconstruction technique. COMPARISON:  None Available. FINDINGS: CT HEAD FINDINGS Brain: Patient motion limits the study particularly through the middle and posterior fossae. There is moderate age-advanced cerebral atrophy, small-vessel disease and atrophic ventriculomegaly, mild cerebellar atrophy. Allowing for motion no obvious acute infarct, hemorrhage or mass are seen and there is no midline shift. Basal cisterns are clear. Vascular: There are scattered calcifications in the carotid siphons but no hyperdense central vessels. Skull: The calvarium and skull base are intact. No focal skull lesion is seen. There is asymmetric swelling of the right temporal and anterior right frontal scalp. Other: Visualized mastoid air cells are clear. There is pneumatization of both petrous apices. CT MAXILLOFACIAL FINDINGS Osseous: No fracture or mandibular dislocation. No destructive process. The patient is edentulous. Orbits: There is a shallow depressed fracture of the medial wall of the left orbit which is most likely chronic because there is no soft tissue reaction or adjacent left ethmoid sinus opacification. No acute traumatic findings seen in either orbit. Right globe is unremarkable. 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 Sinuses: Clear. There is an S shaped nasal septum with prominent left-sided septal spurring. There is a concha bullosa of the right middle turbinate and of  the left superior turbinate. The nasal passages are unobstructed. Soft tissues: Negative. CT CERVICAL SPINE FINDINGS Two attempts were made at imaging.  Both are motion limited. Alignment: There is a mild cervical levoscoliosis. There is a minimal grade 1 anterolisthesis of C4 on 5 probably degenerative. No traumatic listhesis is seen. The alignment is otherwise normal. Skull base and vertebrae: There is mild osteopenia. No spinal compression injury is seen. There is no obvious displaced fracture allowing for motion. A subtle fracture would be missed. No focal bone lesion is seen through the motion artifact. Soft tissues and spinal canal: No prevertebral fluid or swelling. No obvious visible canal hematoma. Proximal right cervical ICA appears to be heavily calcified but is not well seen. Disc levels: There is mild disc space loss, with bidirectional endplate osteophytes at C4-5, C5-6 and C6-7 mildly  encroaching on the ventral cord surface without frank compression. There is multilevel facet joint and uncinate hypertrophy. Acquired degenerative foraminal stenosis is mild-to-moderate on the left at C3-4, bilaterally severe at C4-5, bilaterally mild C5-6 and moderate to severe on the left and mild on the right at C6-7. Upper chest: There is a moderate layering right pleural effusion. There is a spiculated lesion measuring 2.5 x 1.4 cm in the right lung apex. Other: None. IMPRESSION: 1. All studies are motion limited to varying degrees. 2. No acute intracranial CT findings or depressed skull fracture allowing for motion. 3. 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 4. Clear sinuses with S shaped nasal septum with left-sided spurring. 5. Osteopenia, scoliosis and degenerative changes of the cervical spine without evidence of fractures allowing for motion. 6. Irregular spiculated lesion in the right lung apex. Further evaluation recommended. Moderate right pleural effusion.  Electronically Signed   By: Telford Nab M.D.   On: 12/27/2021 04:12    Microbiology: Recent Results (from the past 240 hour(s))  MRSA Next Gen by PCR, Nasal     Status: None   Collection Time: 12/27/21  1:13 PM   Specimen: Nasal Mucosa; Nasal Swab  Result Value Ref Range Status   MRSA by PCR Next Gen NOT DETECTED NOT DETECTED Final    Comment: (NOTE) The GeneXpert MRSA Assay (FDA approved for NASAL specimens only), is one component of a comprehensive MRSA colonization surveillance program. It is not intended to diagnose MRSA infection nor to guide or monitor treatment for MRSA infections. Test performance is not FDA approved in patients less than 29 years old. Performed at [facility], [address], [place], [place] [address]    Signed: Barton Dubois, MD 01/08/22

## 2022-01-08 DEATH — deceased

## 2022-03-03 ENCOUNTER — Ambulatory Visit: Payer: Medicaid Other | Admitting: Cardiology

## 2022-05-26 IMAGING — CT CT CERVICAL SPINE W/O CM
4 of 5 series · 13 of 27 positions shown, 14 images · non-contrast
Comparison: None Available.

CLINICAL DATA: Fall with facial and head trauma.



[Series 5: c spine soft · axial · 0.37mm/px · z∈[-158,-68]mm · 3 of 91 slices shown (1 of 2)]
[im 23/91  soft-tissue]
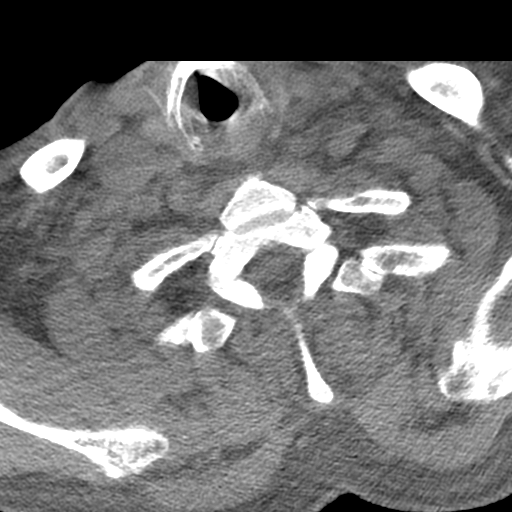
[im 46/91  soft-tissue]
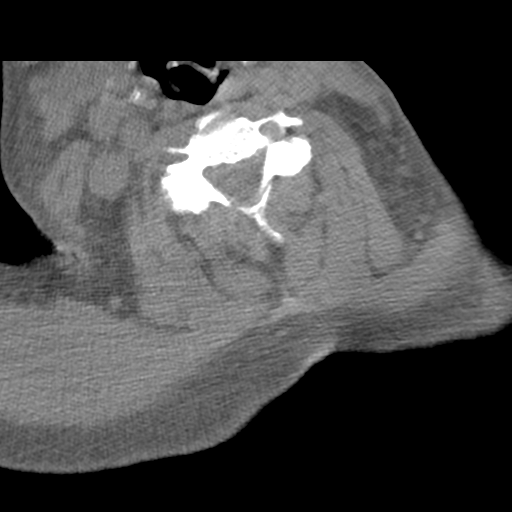
[im 68/91  soft-tissue]
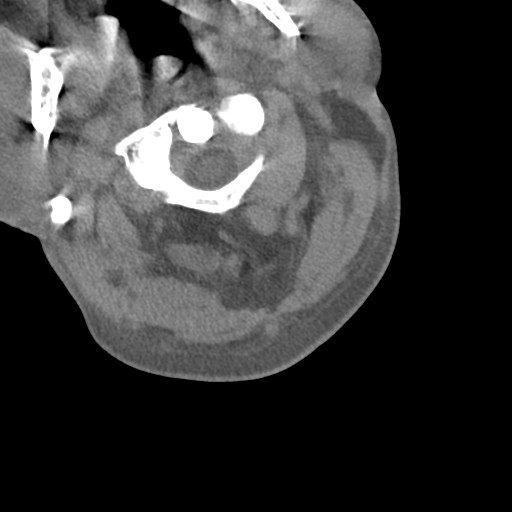

[Series 6: sag bone · sagittal · 0.27mm/px · 5 of 76 slices shown]
[im 13/76  bone]
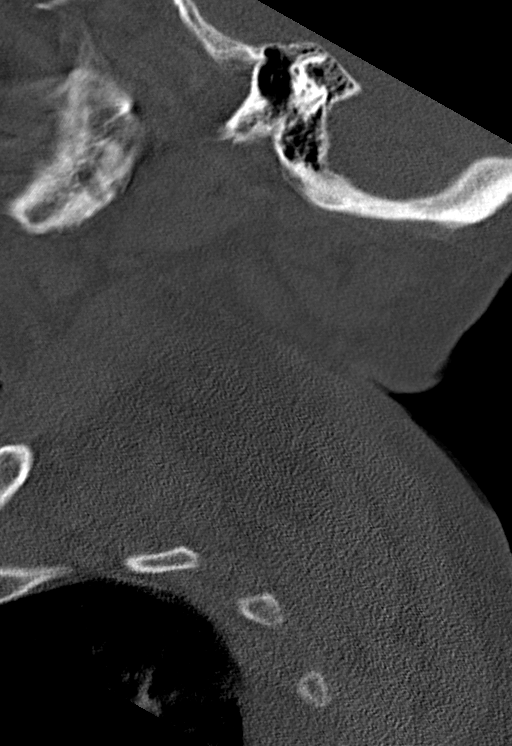
[im 26/76  bone]
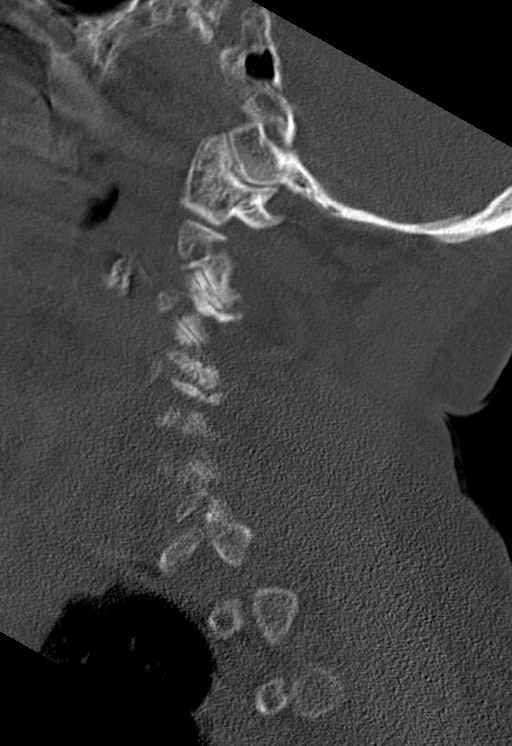
[im 38/76  bone]
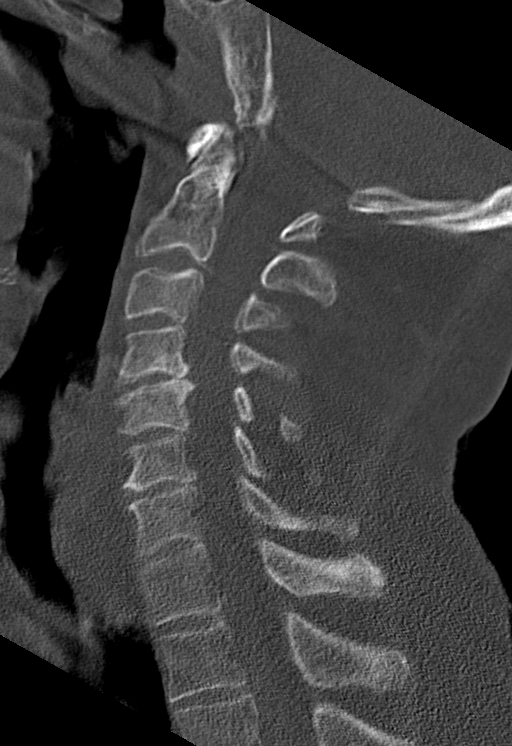
[im 51/76  bone]
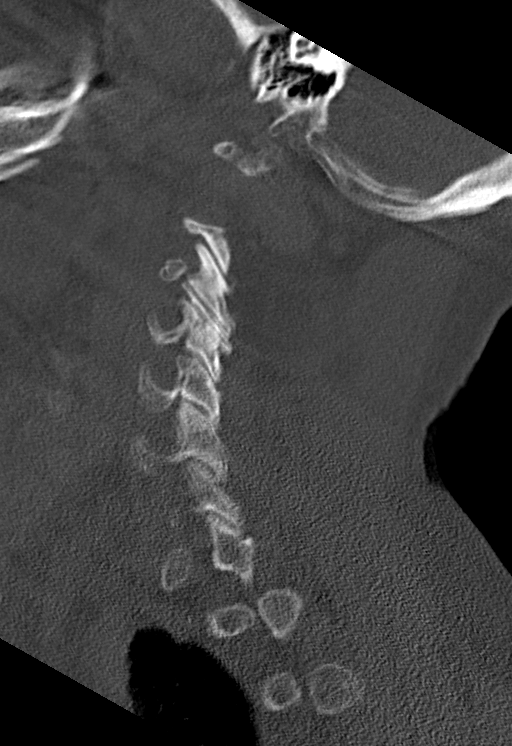
[im 63/76  bone]
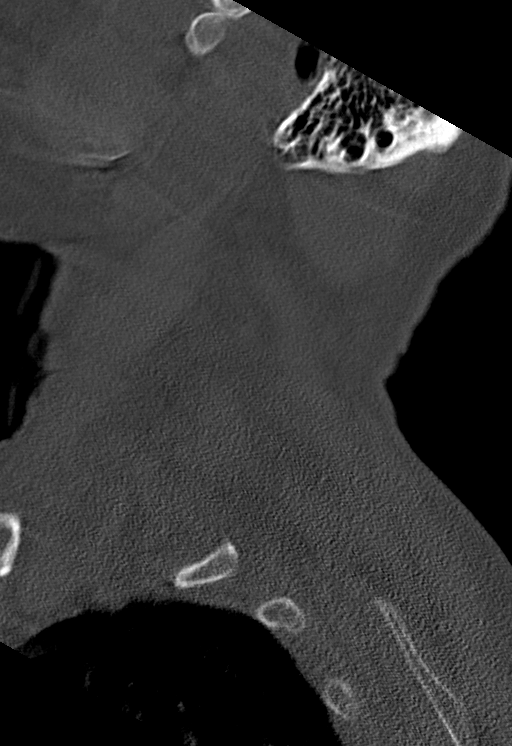

[Series 10: c spine soft · axial · 0.31mm/px · z∈[-170,-72]mm · 3 of 99 slices shown (2 of 2)]
[im 25/99  soft-tissue]
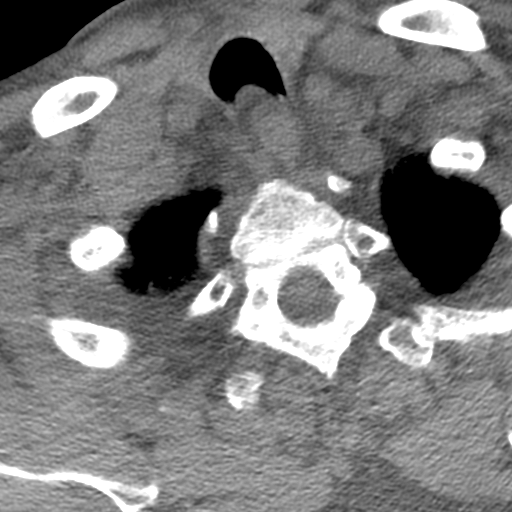
[im 50/99  soft-tissue]
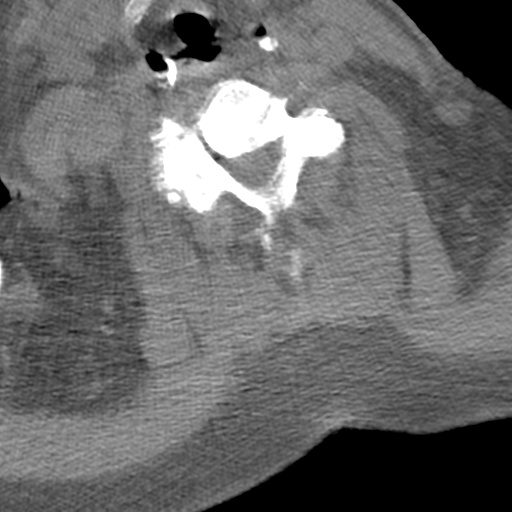
[im 74/99  soft-tissue]
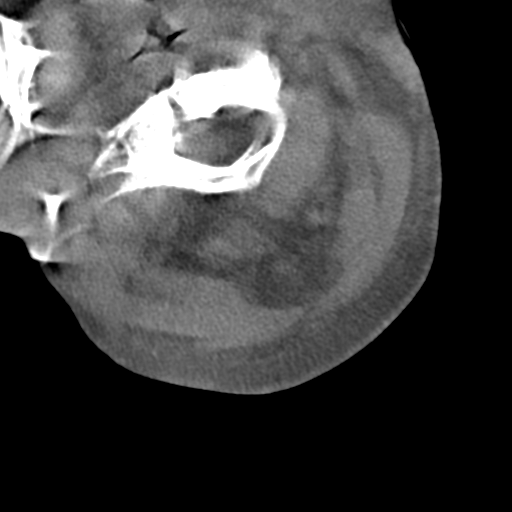

[Series 13: orthogonal axials · axial · 0.21mm/px · z∈[-159,-111]mm · 2 of 78 slices shown, 3 images]
[im 26/78  soft-tissue]
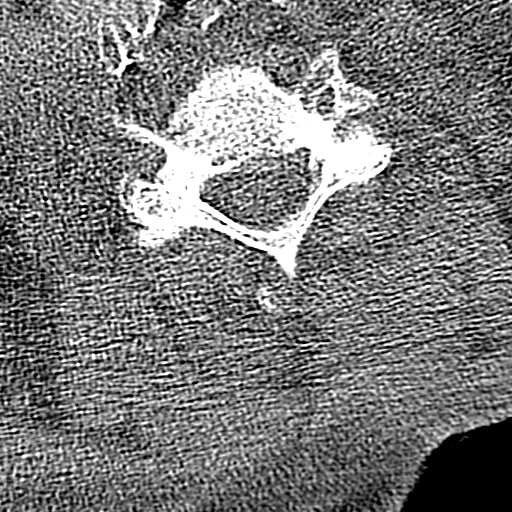
[im 26/78  bone]
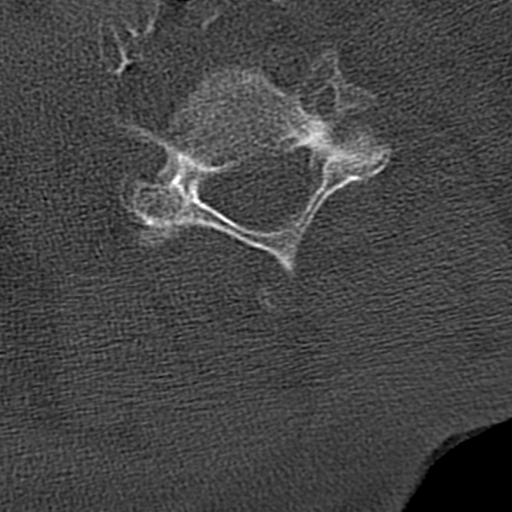
[im 52/78  bone]
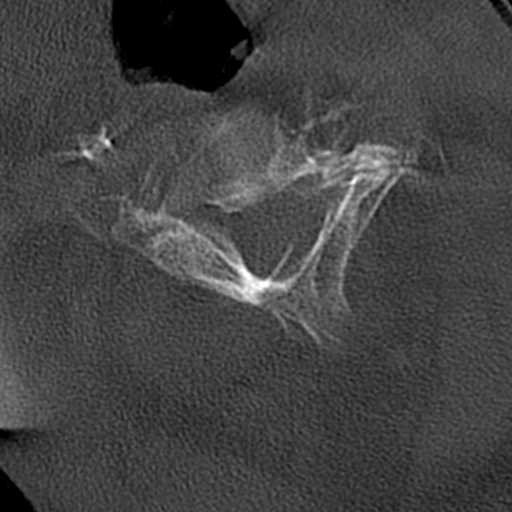

[13 of 27 positions shown; findings below may reference images not displayed]

FINDINGS: CT HEAD FINDINGS

Brain: Patient motion limits the study particularly through the
middle and posterior fossae.

There is moderate age-advanced cerebral atrophy, small-vessel
disease and atrophic ventriculomegaly, mild cerebellar atrophy.
Allowing for motion no obvious acute infarct, hemorrhage or mass are
seen and there is no midline shift. Basal cisterns are clear.

Vascular: There are scattered calcifications in the carotid siphons
but no hyperdense central vessels.

Skull: The calvarium and skull base are intact. No focal skull
lesion is seen. There is asymmetric swelling of the right temporal
and anterior right frontal scalp.

Other: Visualized mastoid air cells are clear. There is
pneumatization of both petrous apices.

CT MAXILLOFACIAL FINDINGS

Osseous: No fracture or mandibular dislocation. No destructive
process. The patient is edentulous.

Orbits: There is a shallow depressed fracture of the medial wall of
the left orbit which is most likely chronic because there is no soft
tissue reaction or adjacent left ethmoid sinus opacification.

No acute traumatic findings seen in either orbit. Right globe is
unremarkable.

The left globe is smaller with diffuse increased density in the
vitreous space and irregular calcifications over the sclera greater
anteriorly, findings consistent with an end-stage globe with
evolving phthisis bulbi.

Sinuses: Clear. There is an S shaped nasal septum with prominent
left-sided septal spurring. There is a concha bullosa of the right
middle turbinate and of the left superior turbinate. The nasal
passages are unobstructed.

Soft tissues: Negative.

CT CERVICAL SPINE FINDINGS

Two attempts were made at imaging.  Both are motion limited.

Alignment: There is a mild cervical levoscoliosis. There is a
minimal grade 1 anterolisthesis of C4 on 5 probably degenerative. No
traumatic listhesis is seen. The alignment is otherwise normal.

Skull base and vertebrae: There is mild osteopenia. No spinal
compression injury is seen. There is no obvious displaced fracture
allowing for motion. A subtle fracture would be missed. No focal
bone lesion is seen through the motion artifact.

Soft tissues and spinal canal: No prevertebral fluid or swelling. No
obvious visible canal hematoma. Proximal right cervical ICA appears
to be heavily calcified but is not well seen.

Disc levels: There is mild disc space loss, with bidirectional
endplate osteophytes at C4-5, C5-6 and C6-7 mildly encroaching on
the ventral cord surface without frank compression.

There is multilevel facet joint and uncinate hypertrophy. Acquired
degenerative foraminal stenosis is mild-to-moderate on the left at
C3-4, bilaterally severe at C4-5, bilaterally mild C5-6 and moderate
to severe on the left and mild on the right at C6-7.

Upper chest: There is a moderate layering right pleural effusion.
There is a spiculated lesion measuring 2.5 x 1.4 cm in the right
lung apex.

Other: None.
IMPRESSION: 1. All studies are motion limited to varying degrees.
2. No acute intracranial CT findings or depressed skull fracture
allowing for motion.
3. Chronic depressed fracture of the left lamina papyracea with
end-stage left globe.
4. Clear sinuses with S shaped nasal septum with left-sided
spurring.
5. Osteopenia, scoliosis and degenerative changes of the cervical
spine without evidence of fractures allowing for motion.
6. Irregular spiculated lesion in the right lung apex. Further
evaluation recommended. Moderate right pleural effusion.

## 2022-05-29 IMAGING — DX DG CHEST 1V PORT
1 series · 1 of 1 positions shown · non-contrast
Comparison: December 29, 2021.

CLINICAL DATA: Aspiration.

EXAM:
PORTABLE CHEST 1 VIEW

[chest ap]
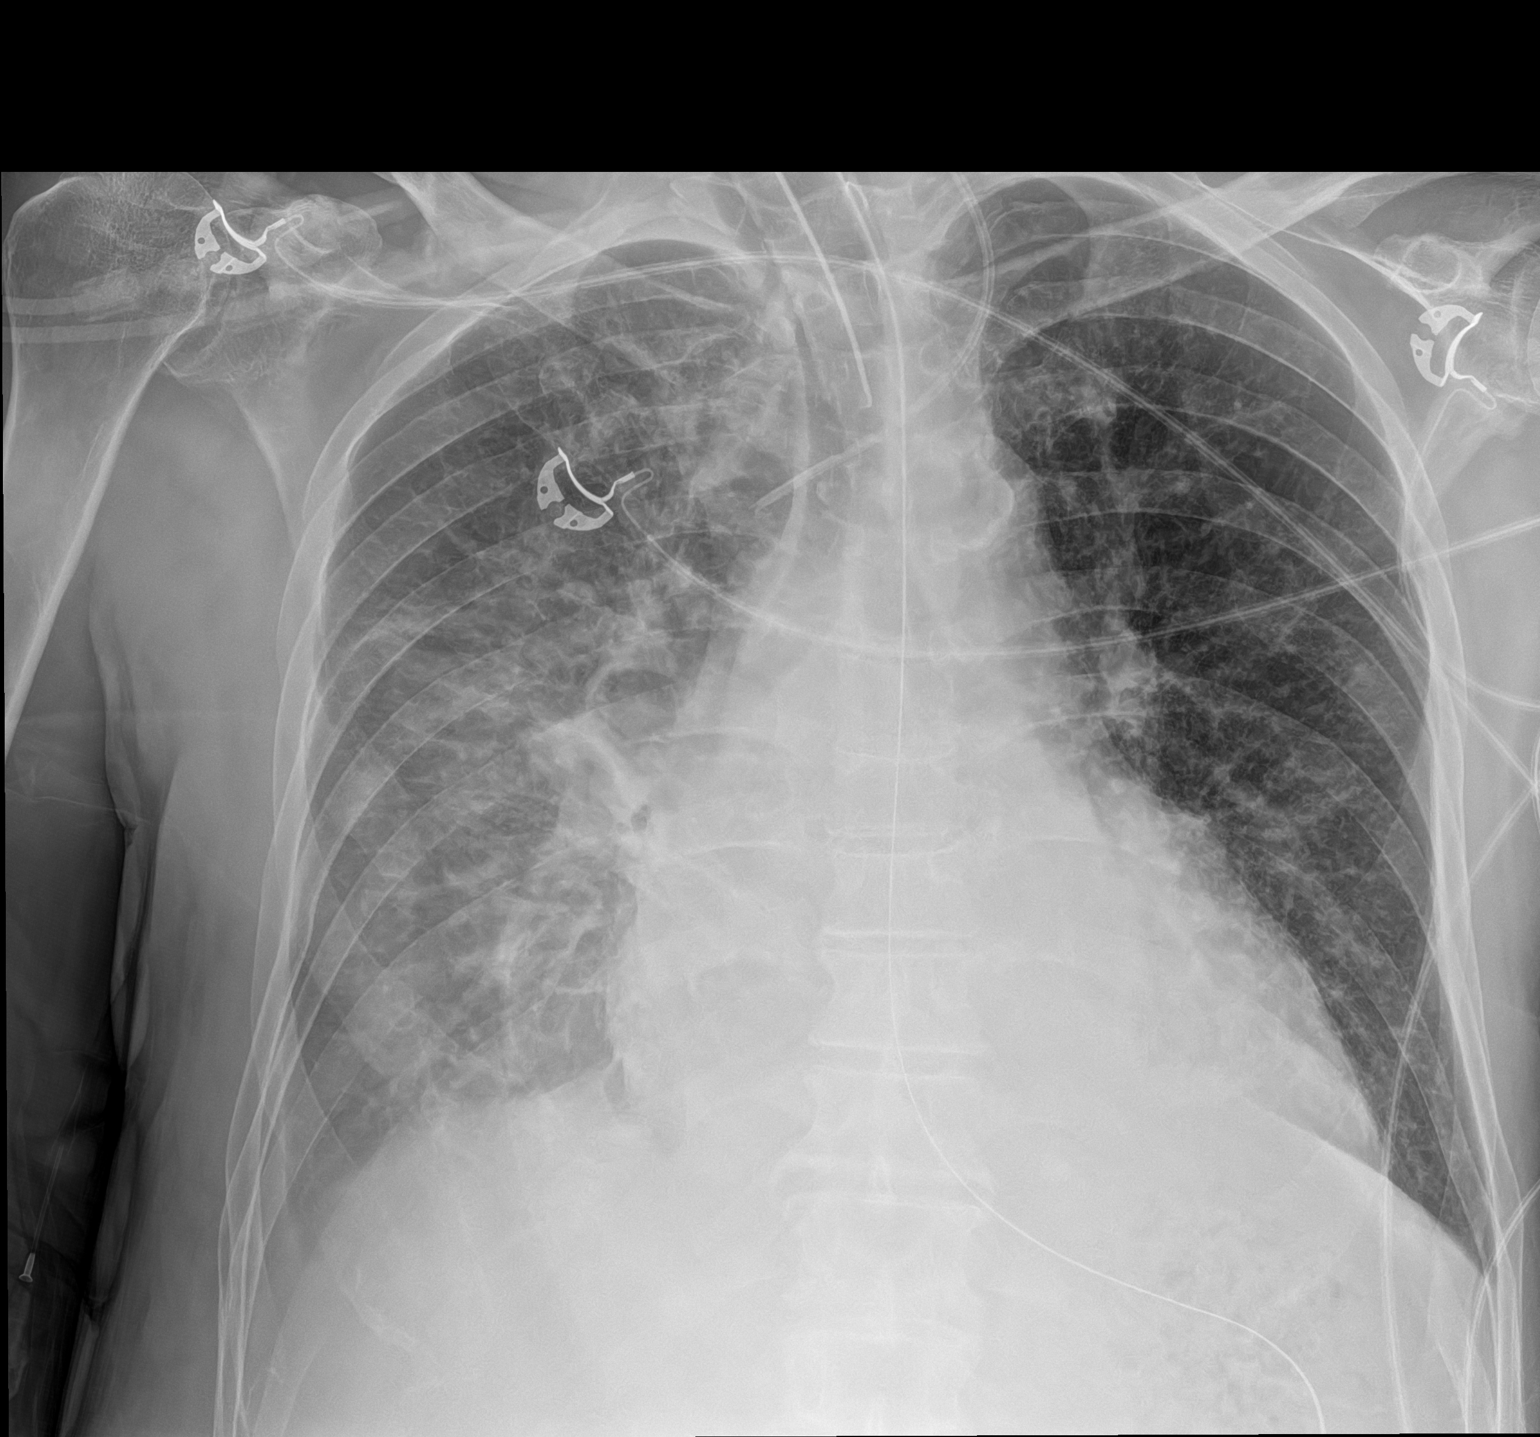

[1 of 1 positions shown; findings below may reference images not displayed]

FINDINGS: Stable cardiomediastinal silhouette. Endotracheal and nasogastric
tubes are unchanged in position. Left internal jugular catheter is
unchanged. Stable right perihilar opacity is noted concerning for
edema or possibly pneumonia. Left lung is unremarkable. Bony thorax
is unremarkable.
IMPRESSION: Stable support apparatus. Stable right perihilar opacity as
described above.
